# Patient Record
Sex: Female | Born: 1945
Health system: Southern US, Community
[De-identification: ages and names within clinical notes are randomized; demographics above are authoritative.]

## PROBLEM LIST (undated history)

## (undated) DIAGNOSIS — K219 Gastro-esophageal reflux disease without esophagitis: Secondary | ICD-10-CM

## (undated) DIAGNOSIS — F329 Major depressive disorder, single episode, unspecified: Secondary | ICD-10-CM

## (undated) DIAGNOSIS — F0390 Unspecified dementia without behavioral disturbance: Secondary | ICD-10-CM

## (undated) DIAGNOSIS — E785 Hyperlipidemia, unspecified: Secondary | ICD-10-CM

## (undated) DIAGNOSIS — I10 Essential (primary) hypertension: Secondary | ICD-10-CM

## (undated) DIAGNOSIS — F32A Depression, unspecified: Secondary | ICD-10-CM

## (undated) DIAGNOSIS — E039 Hypothyroidism, unspecified: Secondary | ICD-10-CM

## (undated) HISTORY — PX: BACK SURGERY: SHX140

## (undated) HISTORY — DX: Major depressive disorder, single episode, unspecified: F32.9

## (undated) HISTORY — DX: Hyperlipidemia, unspecified: E78.5

## (undated) HISTORY — DX: Gastro-esophageal reflux disease without esophagitis: K21.9

## (undated) HISTORY — DX: Unspecified dementia, unspecified severity, without behavioral disturbance, psychotic disturbance, mood disturbance, and anxiety: F03.90

## (undated) HISTORY — PX: APPENDECTOMY: SHX54

## (undated) HISTORY — PX: ABDOMINAL HYSTERECTOMY: SHX81

## (undated) HISTORY — PX: CHOLECYSTECTOMY: SHX55

## (undated) HISTORY — PX: SALPINGOOPHORECTOMY: SHX82

## (undated) HISTORY — DX: Hypothyroidism, unspecified: E03.9

## (undated) HISTORY — DX: Depression, unspecified: F32.A

## (undated) HISTORY — DX: Essential (primary) hypertension: I10

---

## 2003-12-28 ENCOUNTER — Ambulatory Visit: Payer: Self-pay | Admitting: Family Medicine

## 2004-03-11 ENCOUNTER — Encounter: Admission: RE | Admit: 2004-03-11 | Discharge: 2004-03-11 | Payer: Self-pay | Admitting: Neurosurgery

## 2004-03-23 ENCOUNTER — Encounter: Admission: RE | Admit: 2004-03-23 | Discharge: 2004-03-23 | Payer: Self-pay | Admitting: Neurosurgery

## 2004-03-28 ENCOUNTER — Inpatient Hospital Stay: Payer: Self-pay | Admitting: Internal Medicine

## 2004-04-20 ENCOUNTER — Encounter: Admission: RE | Admit: 2004-04-20 | Discharge: 2004-04-20 | Payer: Self-pay | Admitting: Neurosurgery

## 2004-09-07 ENCOUNTER — Ambulatory Visit (HOSPITAL_COMMUNITY): Admission: RE | Admit: 2004-09-07 | Discharge: 2004-09-07 | Payer: Self-pay | Admitting: Neurosurgery

## 2005-01-11 ENCOUNTER — Ambulatory Visit: Payer: Self-pay | Admitting: Unknown Physician Specialty

## 2007-07-20 ENCOUNTER — Encounter: Admission: RE | Admit: 2007-07-20 | Discharge: 2007-07-20 | Payer: Self-pay | Admitting: Neurosurgery

## 2009-09-11 ENCOUNTER — Ambulatory Visit: Payer: Self-pay | Admitting: Family Medicine

## 2009-12-16 LAB — HM MAMMOGRAPHY: HM Mammogram: NORMAL

## 2010-03-14 ENCOUNTER — Encounter: Payer: Self-pay | Admitting: Neurosurgery

## 2010-07-10 NOTE — Op Note (Signed)
NAME:  Paige Baldwin, Paige Baldwin                 ACCOUNT NO.:  [id number]   MEDICAL RECORD NO.:  [id number]          PATIENT TYPE:  OIB   LOCATION:  2899                         FACILITY:  [facility]   PHYSICIAN:  Kathaleen Maser. Pool, M.D.    DATE OF BIRTH:  04/16/1945   DATE OF PROCEDURE:  09/07/2004  DATE OF DISCHARGE:                                 OPERATIVE REPORT   PREOPERATIVE DIAGNOSES:  Right L4-5 stenosis with associated synovial cyst  and radiculopathy.   POSTOPERATIVE DIAGNOSES:  Right L4-5 stenosis with associated synovial cyst  and radiculopathy.   OPERATION PERFORMED:  Right L4-5 decompressive lumbar laminotomy with  resection of synovial cyst.   SURGEON:  Henry A. Pool, M.D.   ASSISTANT:  Izell Victoria. Elesa Hacker, M.D.   ANESTHESIA:  General endotracheal.   INDICATIONS FOR PROCEDURE:  The patient is a 65 year old female with a  history of back and right lower extremity pain consistent with a right-sided  L5 radiculopathy.  She failed conservative management.  Work-up demonstrates  evidence of a significant rightward L4-5 spondylitic protrusion and  associated synovial cysts causing compression to the right-sided L5 nerve  root.  The patient has been counseled as to her options.  She has decided to  proceed with a right-sided L4-5 laminotomy and resection of synovial cyst in  hopes of improving her symptoms.   DESCRIPTION OF PROCEDURE:  The patient was taken to the operating room and  placed on the table in the supine position.  After adequate level of  anesthesia was achieved, the patient was positioned prone onto a Wilson  frame and appropriately padded.  The patient's lumbar region was prepped and  draped sterilely.  A 10 blade was used to make a linear skin incision  overlying the L4-5 interspace.  This was carried down sharply in the  midline.  A subperiosteal dissection was then performed exposing the lamina  and facet joints of L4 and L5 on the right.  Deep self-retaining retractor  was placed.  Intraoperative x-ray was taken, the level was confirmed.  The  laminotomy was then performed using a high speed drill and Kerrison rongeurs  to remove the inferior aspect of the lamina at L4, medial aspect of the L4-5  facet joint and superior rim of the L5 lamina.  Ligamentum flavum was then  elevated and resected in piecemeal fashion using Kerrison rongeurs.  The  underlying thecal sac and exiting L5 nerve root were identified.  The  microscope was brought into the field and used for microdissection of the  right-sided S1 nerve root and associated synovial cyst.  The synovial cyst  was identified and resected using Kerrison rongeurs.  This was done along  the course of the right-sided L5 nerve root.  A wide foraminotomy then  performed along the course of the exiting L5 nerve root.  The disk itself  was quite flat.  There was no evidence of any residual compression.  There  was no evidence of injury to thecal sac or nerve roots.  The wound was then  irrigated with antibiotic solution.  Gelfoam was  placed topically for  hemostasis which was found to be good.  Microscope and retractor system were  removed.  Hemostasis in muscle was achieved with electrocautery.  The wound  was then closed in layers with Vicryl sutures.  Steri-Strips and sterile  dressing were applied.  There were no apparent complications.  The patient  tolerated the procedure well and returned to the recovery room  postoperatively.       HAP/MEDQ  D:  09/07/2004  T:  09/07/2004  Job:  [id number]

## 2011-01-02 ENCOUNTER — Observation Stay: Payer: Self-pay | Admitting: Student

## 2011-03-02 DIAGNOSIS — R5381 Other malaise: Secondary | ICD-10-CM | POA: Diagnosis not present

## 2011-03-02 DIAGNOSIS — E039 Hypothyroidism, unspecified: Secondary | ICD-10-CM | POA: Diagnosis not present

## 2011-03-02 DIAGNOSIS — F329 Major depressive disorder, single episode, unspecified: Secondary | ICD-10-CM | POA: Diagnosis not present

## 2011-03-02 DIAGNOSIS — R5383 Other fatigue: Secondary | ICD-10-CM | POA: Diagnosis not present

## 2011-03-02 DIAGNOSIS — R109 Unspecified abdominal pain: Secondary | ICD-10-CM | POA: Diagnosis not present

## 2011-04-06 DIAGNOSIS — E7800 Pure hypercholesterolemia, unspecified: Secondary | ICD-10-CM | POA: Diagnosis not present

## 2011-04-06 DIAGNOSIS — I10 Essential (primary) hypertension: Secondary | ICD-10-CM | POA: Diagnosis not present

## 2011-04-06 DIAGNOSIS — F329 Major depressive disorder, single episode, unspecified: Secondary | ICD-10-CM | POA: Diagnosis not present

## 2011-04-06 DIAGNOSIS — R5381 Other malaise: Secondary | ICD-10-CM | POA: Diagnosis not present

## 2011-04-09 DIAGNOSIS — R5381 Other malaise: Secondary | ICD-10-CM | POA: Diagnosis not present

## 2011-04-09 DIAGNOSIS — R5383 Other fatigue: Secondary | ICD-10-CM | POA: Diagnosis not present

## 2011-04-09 DIAGNOSIS — E039 Hypothyroidism, unspecified: Secondary | ICD-10-CM | POA: Diagnosis not present

## 2011-04-21 DIAGNOSIS — R5383 Other fatigue: Secondary | ICD-10-CM | POA: Diagnosis not present

## 2011-04-21 DIAGNOSIS — F329 Major depressive disorder, single episode, unspecified: Secondary | ICD-10-CM | POA: Diagnosis not present

## 2011-04-21 DIAGNOSIS — I10 Essential (primary) hypertension: Secondary | ICD-10-CM | POA: Diagnosis not present

## 2011-04-21 DIAGNOSIS — R5381 Other malaise: Secondary | ICD-10-CM | POA: Diagnosis not present

## 2011-05-03 DIAGNOSIS — L299 Pruritus, unspecified: Secondary | ICD-10-CM | POA: Diagnosis not present

## 2011-07-29 DIAGNOSIS — F329 Major depressive disorder, single episode, unspecified: Secondary | ICD-10-CM | POA: Diagnosis not present

## 2011-07-29 DIAGNOSIS — I10 Essential (primary) hypertension: Secondary | ICD-10-CM | POA: Diagnosis not present

## 2011-07-29 DIAGNOSIS — E785 Hyperlipidemia, unspecified: Secondary | ICD-10-CM | POA: Diagnosis not present

## 2011-07-29 DIAGNOSIS — E039 Hypothyroidism, unspecified: Secondary | ICD-10-CM | POA: Diagnosis not present

## 2011-07-30 DIAGNOSIS — E039 Hypothyroidism, unspecified: Secondary | ICD-10-CM | POA: Diagnosis not present

## 2011-07-30 DIAGNOSIS — E785 Hyperlipidemia, unspecified: Secondary | ICD-10-CM | POA: Diagnosis not present

## 2011-11-12 DIAGNOSIS — L819 Disorder of pigmentation, unspecified: Secondary | ICD-10-CM | POA: Diagnosis not present

## 2011-11-12 DIAGNOSIS — L219 Seborrheic dermatitis, unspecified: Secondary | ICD-10-CM | POA: Diagnosis not present

## 2012-01-15 DIAGNOSIS — E039 Hypothyroidism, unspecified: Secondary | ICD-10-CM | POA: Diagnosis not present

## 2012-01-15 DIAGNOSIS — E785 Hyperlipidemia, unspecified: Secondary | ICD-10-CM | POA: Diagnosis not present

## 2012-01-15 DIAGNOSIS — Z23 Encounter for immunization: Secondary | ICD-10-CM | POA: Diagnosis not present

## 2012-03-13 DIAGNOSIS — Z1151 Encounter for screening for human papillomavirus (HPV): Secondary | ICD-10-CM | POA: Diagnosis not present

## 2012-03-13 DIAGNOSIS — Z124 Encounter for screening for malignant neoplasm of cervix: Secondary | ICD-10-CM | POA: Diagnosis not present

## 2012-03-13 DIAGNOSIS — Z1231 Encounter for screening mammogram for malignant neoplasm of breast: Secondary | ICD-10-CM | POA: Diagnosis not present

## 2012-03-13 DIAGNOSIS — Z01419 Encounter for gynecological examination (general) (routine) without abnormal findings: Secondary | ICD-10-CM | POA: Diagnosis not present

## 2012-03-28 DIAGNOSIS — E039 Hypothyroidism, unspecified: Secondary | ICD-10-CM | POA: Diagnosis not present

## 2012-03-28 DIAGNOSIS — F411 Generalized anxiety disorder: Secondary | ICD-10-CM | POA: Diagnosis not present

## 2012-03-28 DIAGNOSIS — F329 Major depressive disorder, single episode, unspecified: Secondary | ICD-10-CM | POA: Diagnosis not present

## 2012-03-28 DIAGNOSIS — I10 Essential (primary) hypertension: Secondary | ICD-10-CM | POA: Diagnosis not present

## 2012-04-26 DIAGNOSIS — F329 Major depressive disorder, single episode, unspecified: Secondary | ICD-10-CM | POA: Diagnosis not present

## 2012-04-26 DIAGNOSIS — K5289 Other specified noninfective gastroenteritis and colitis: Secondary | ICD-10-CM | POA: Diagnosis not present

## 2012-04-26 DIAGNOSIS — F411 Generalized anxiety disorder: Secondary | ICD-10-CM | POA: Diagnosis not present

## 2012-04-26 DIAGNOSIS — I10 Essential (primary) hypertension: Secondary | ICD-10-CM | POA: Diagnosis not present

## 2012-04-27 DIAGNOSIS — I10 Essential (primary) hypertension: Secondary | ICD-10-CM | POA: Diagnosis not present

## 2012-04-27 DIAGNOSIS — R197 Diarrhea, unspecified: Secondary | ICD-10-CM | POA: Diagnosis not present

## 2012-04-27 DIAGNOSIS — E039 Hypothyroidism, unspecified: Secondary | ICD-10-CM | POA: Diagnosis not present

## 2012-04-27 DIAGNOSIS — E785 Hyperlipidemia, unspecified: Secondary | ICD-10-CM | POA: Diagnosis not present

## 2012-05-22 DIAGNOSIS — R197 Diarrhea, unspecified: Secondary | ICD-10-CM | POA: Diagnosis not present

## 2012-05-22 DIAGNOSIS — F411 Generalized anxiety disorder: Secondary | ICD-10-CM | POA: Diagnosis not present

## 2012-05-22 DIAGNOSIS — E039 Hypothyroidism, unspecified: Secondary | ICD-10-CM | POA: Diagnosis not present

## 2012-05-22 DIAGNOSIS — R109 Unspecified abdominal pain: Secondary | ICD-10-CM | POA: Diagnosis not present

## 2012-05-31 DIAGNOSIS — R109 Unspecified abdominal pain: Secondary | ICD-10-CM | POA: Diagnosis not present

## 2012-06-06 ENCOUNTER — Ambulatory Visit: Payer: Self-pay | Admitting: Gastroenterology

## 2012-06-06 DIAGNOSIS — R197 Diarrhea, unspecified: Secondary | ICD-10-CM | POA: Diagnosis not present

## 2012-06-06 DIAGNOSIS — K648 Other hemorrhoids: Secondary | ICD-10-CM | POA: Diagnosis not present

## 2012-06-06 DIAGNOSIS — Z79899 Other long term (current) drug therapy: Secondary | ICD-10-CM | POA: Diagnosis not present

## 2012-06-06 DIAGNOSIS — R1084 Generalized abdominal pain: Secondary | ICD-10-CM | POA: Diagnosis not present

## 2012-06-06 DIAGNOSIS — E785 Hyperlipidemia, unspecified: Secondary | ICD-10-CM | POA: Diagnosis not present

## 2012-06-06 DIAGNOSIS — Z8249 Family history of ischemic heart disease and other diseases of the circulatory system: Secondary | ICD-10-CM | POA: Diagnosis not present

## 2012-06-06 DIAGNOSIS — K649 Unspecified hemorrhoids: Secondary | ICD-10-CM | POA: Diagnosis not present

## 2012-06-06 DIAGNOSIS — K219 Gastro-esophageal reflux disease without esophagitis: Secondary | ICD-10-CM | POA: Diagnosis not present

## 2012-06-06 DIAGNOSIS — K449 Diaphragmatic hernia without obstruction or gangrene: Secondary | ICD-10-CM | POA: Diagnosis not present

## 2012-06-06 DIAGNOSIS — I10 Essential (primary) hypertension: Secondary | ICD-10-CM | POA: Diagnosis not present

## 2012-06-06 DIAGNOSIS — M1990 Unspecified osteoarthritis, unspecified site: Secondary | ICD-10-CM | POA: Diagnosis not present

## 2012-06-06 DIAGNOSIS — Z9079 Acquired absence of other genital organ(s): Secondary | ICD-10-CM | POA: Diagnosis not present

## 2012-06-06 DIAGNOSIS — R109 Unspecified abdominal pain: Secondary | ICD-10-CM | POA: Diagnosis not present

## 2012-06-06 DIAGNOSIS — Z8489 Family history of other specified conditions: Secondary | ICD-10-CM | POA: Diagnosis not present

## 2012-06-06 DIAGNOSIS — E039 Hypothyroidism, unspecified: Secondary | ICD-10-CM | POA: Diagnosis not present

## 2012-06-06 DIAGNOSIS — K5289 Other specified noninfective gastroenteritis and colitis: Secondary | ICD-10-CM | POA: Diagnosis not present

## 2012-06-06 LAB — HM COLONOSCOPY: HM Colonoscopy: NORMAL

## 2012-06-12 DIAGNOSIS — K5289 Other specified noninfective gastroenteritis and colitis: Secondary | ICD-10-CM | POA: Diagnosis not present

## 2012-12-07 DIAGNOSIS — E039 Hypothyroidism, unspecified: Secondary | ICD-10-CM | POA: Diagnosis not present

## 2012-12-07 DIAGNOSIS — F411 Generalized anxiety disorder: Secondary | ICD-10-CM | POA: Diagnosis not present

## 2012-12-07 DIAGNOSIS — Z23 Encounter for immunization: Secondary | ICD-10-CM | POA: Diagnosis not present

## 2012-12-07 DIAGNOSIS — R197 Diarrhea, unspecified: Secondary | ICD-10-CM | POA: Diagnosis not present

## 2012-12-07 DIAGNOSIS — R109 Unspecified abdominal pain: Secondary | ICD-10-CM | POA: Diagnosis not present

## 2013-01-26 DIAGNOSIS — L738 Other specified follicular disorders: Secondary | ICD-10-CM | POA: Diagnosis not present

## 2013-01-26 DIAGNOSIS — L819 Disorder of pigmentation, unspecified: Secondary | ICD-10-CM | POA: Diagnosis not present

## 2013-01-26 DIAGNOSIS — D239 Other benign neoplasm of skin, unspecified: Secondary | ICD-10-CM | POA: Diagnosis not present

## 2013-03-16 DIAGNOSIS — M949 Disorder of cartilage, unspecified: Secondary | ICD-10-CM | POA: Diagnosis not present

## 2013-03-16 DIAGNOSIS — M899 Disorder of bone, unspecified: Secondary | ICD-10-CM | POA: Diagnosis not present

## 2013-03-16 DIAGNOSIS — Z1151 Encounter for screening for human papillomavirus (HPV): Secondary | ICD-10-CM | POA: Diagnosis not present

## 2013-03-16 DIAGNOSIS — Z01419 Encounter for gynecological examination (general) (routine) without abnormal findings: Secondary | ICD-10-CM | POA: Diagnosis not present

## 2013-06-06 DIAGNOSIS — F411 Generalized anxiety disorder: Secondary | ICD-10-CM | POA: Diagnosis not present

## 2013-06-06 DIAGNOSIS — E7800 Pure hypercholesterolemia, unspecified: Secondary | ICD-10-CM | POA: Diagnosis not present

## 2013-06-06 DIAGNOSIS — I10 Essential (primary) hypertension: Secondary | ICD-10-CM | POA: Diagnosis not present

## 2013-06-06 DIAGNOSIS — F320 Major depressive disorder, single episode, mild: Secondary | ICD-10-CM | POA: Diagnosis not present

## 2013-06-06 DIAGNOSIS — Z23 Encounter for immunization: Secondary | ICD-10-CM | POA: Diagnosis not present

## 2013-06-19 DIAGNOSIS — E039 Hypothyroidism, unspecified: Secondary | ICD-10-CM | POA: Diagnosis not present

## 2013-06-19 DIAGNOSIS — I10 Essential (primary) hypertension: Secondary | ICD-10-CM | POA: Diagnosis not present

## 2013-06-19 DIAGNOSIS — M19049 Primary osteoarthritis, unspecified hand: Secondary | ICD-10-CM | POA: Diagnosis not present

## 2013-06-19 DIAGNOSIS — E785 Hyperlipidemia, unspecified: Secondary | ICD-10-CM | POA: Diagnosis not present

## 2013-12-18 DIAGNOSIS — Z23 Encounter for immunization: Secondary | ICD-10-CM | POA: Diagnosis not present

## 2014-01-31 DIAGNOSIS — L821 Other seborrheic keratosis: Secondary | ICD-10-CM | POA: Diagnosis not present

## 2014-01-31 DIAGNOSIS — L814 Other melanin hyperpigmentation: Secondary | ICD-10-CM | POA: Diagnosis not present

## 2014-01-31 DIAGNOSIS — D1800 Hemangioma unspecified site: Secondary | ICD-10-CM | POA: Diagnosis not present

## 2014-01-31 DIAGNOSIS — L820 Inflamed seborrheic keratosis: Secondary | ICD-10-CM | POA: Diagnosis not present

## 2014-01-31 DIAGNOSIS — Z1283 Encounter for screening for malignant neoplasm of skin: Secondary | ICD-10-CM | POA: Diagnosis not present

## 2014-01-31 DIAGNOSIS — L578 Other skin changes due to chronic exposure to nonionizing radiation: Secondary | ICD-10-CM | POA: Diagnosis not present

## 2014-01-31 DIAGNOSIS — D229 Melanocytic nevi, unspecified: Secondary | ICD-10-CM | POA: Diagnosis not present

## 2014-03-07 DIAGNOSIS — Z0000 Encounter for general adult medical examination without abnormal findings: Secondary | ICD-10-CM | POA: Diagnosis not present

## 2014-03-19 DIAGNOSIS — Z1231 Encounter for screening mammogram for malignant neoplasm of breast: Secondary | ICD-10-CM | POA: Diagnosis not present

## 2014-03-19 DIAGNOSIS — Z01419 Encounter for gynecological examination (general) (routine) without abnormal findings: Secondary | ICD-10-CM | POA: Diagnosis not present

## 2014-03-19 DIAGNOSIS — M8580 Other specified disorders of bone density and structure, unspecified site: Secondary | ICD-10-CM | POA: Diagnosis not present

## 2014-03-21 DIAGNOSIS — F419 Anxiety disorder, unspecified: Secondary | ICD-10-CM | POA: Diagnosis not present

## 2014-03-21 DIAGNOSIS — F329 Major depressive disorder, single episode, unspecified: Secondary | ICD-10-CM | POA: Diagnosis not present

## 2014-03-21 DIAGNOSIS — I10 Essential (primary) hypertension: Secondary | ICD-10-CM | POA: Diagnosis not present

## 2014-03-21 DIAGNOSIS — E039 Hypothyroidism, unspecified: Secondary | ICD-10-CM | POA: Diagnosis not present

## 2014-03-21 DIAGNOSIS — E785 Hyperlipidemia, unspecified: Secondary | ICD-10-CM | POA: Diagnosis not present

## 2014-03-22 DIAGNOSIS — E063 Autoimmune thyroiditis: Secondary | ICD-10-CM | POA: Diagnosis not present

## 2014-03-22 DIAGNOSIS — F320 Major depressive disorder, single episode, mild: Secondary | ICD-10-CM | POA: Diagnosis not present

## 2014-03-22 DIAGNOSIS — E785 Hyperlipidemia, unspecified: Secondary | ICD-10-CM | POA: Diagnosis not present

## 2014-03-22 LAB — HEPATIC FUNCTION PANEL
ALT: 10 U/L (ref 7–35)
AST: 15 U/L (ref 13–35)
Alkaline Phosphatase: 98 U/L (ref 25–125)
Bilirubin, Total: 0.5 mg/dL

## 2014-03-22 LAB — BASIC METABOLIC PANEL
BUN: 15 mg/dL (ref 4–21)
CREATININE: 0.8 mg/dL (ref <=1.1)
Glucose: 92 mg/dL
POTASSIUM: 4.3 mmol/L (ref 3.4–5.3)
SODIUM: 143 mmol/L (ref 137–147)

## 2014-03-22 LAB — CBC AND DIFFERENTIAL
HEMATOCRIT: 38 % (ref 36–46)
HEMOGLOBIN: 12.7 g/dL (ref 12.0–16.0)
NEUTROS ABS: 49 /uL
Platelets: 250 10*3/uL (ref 150–399)
WBC: 6.3 10*3/mL

## 2014-03-22 LAB — LIPID PANEL
CHOLESTEROL: 286 mg/dL — AB (ref 0–200)
HDL: 75 mg/dL — AB (ref 35–70)
LDL Cholesterol: 187 mg/dL
LDl/HDL Ratio: 2.5
Triglycerides: 118 mg/dL (ref 40–160)

## 2014-03-22 LAB — TSH: TSH: 5.06 u[IU]/mL (ref <=5.90)

## 2014-03-29 DIAGNOSIS — R928 Other abnormal and inconclusive findings on diagnostic imaging of breast: Secondary | ICD-10-CM | POA: Diagnosis not present

## 2014-06-06 DIAGNOSIS — M19049 Primary osteoarthritis, unspecified hand: Secondary | ICD-10-CM | POA: Diagnosis not present

## 2014-06-18 ENCOUNTER — Ambulatory Visit
Admit: 2014-06-18 | Disposition: A | Discharge status: Home / Self Care | Payer: Self-pay | Attending: Specialist | Admitting: Specialist

## 2014-06-18 ENCOUNTER — Ambulatory Visit: Payer: Self-pay

## 2014-06-18 DIAGNOSIS — Z01812 Encounter for preprocedural laboratory examination: Secondary | ICD-10-CM | POA: Diagnosis not present

## 2014-06-18 DIAGNOSIS — Z01810 Encounter for preprocedural cardiovascular examination: Secondary | ICD-10-CM | POA: Diagnosis not present

## 2014-06-18 LAB — CBC WITH DIFFERENTIAL/PLATELET
BASOS ABS: 0 10*3/uL (ref 0.0–0.1)
Basophil %: 0.6 %
Eosinophil #: 0.1 10*3/uL (ref 0.0–0.7)
Eosinophil %: 1.1 %
HCT: 38.7 % (ref 35.0–47.0)
HGB: 12.6 g/dL (ref 12.0–16.0)
LYMPHS ABS: 1.8 10*3/uL (ref 1.0–3.6)
LYMPHS PCT: 32.5 %
MCH: 27.3 pg (ref 26.0–34.0)
MCHC: 32.7 g/dL (ref 32.0–36.0)
MCV: 84 fL (ref 80–100)
MONO ABS: 0.9 x10 3/mm (ref 0.2–0.9)
MONOS PCT: 15.8 %
Neutrophil #: 2.8 10*3/uL (ref 1.4–6.5)
Neutrophil %: 50 %
Platelet: 213 10*3/uL (ref 150–440)
RBC: 4.63 10*6/uL (ref 3.80–5.20)
RDW: 14 % (ref 11.5–14.5)
WBC: 5.5 10*3/uL (ref 3.6–11.0)

## 2014-06-25 ENCOUNTER — Ambulatory Visit
Admission: RE | Admit: 2014-06-25 | Discharge: 2014-06-25 | Disposition: A | Discharge status: Home / Self Care | Payer: Medicare Other | Source: Ambulatory Visit | Attending: Specialist | Admitting: Specialist

## 2014-06-25 ENCOUNTER — Inpatient Hospital Stay: Payer: Medicare Other | Admitting: Certified Registered Nurse Anesthetist

## 2014-06-25 ENCOUNTER — Encounter: Payer: Self-pay | Admitting: *Deleted

## 2014-06-25 ENCOUNTER — Encounter
Admission: RE | Disposition: A | Discharge status: Home / Self Care | Payer: Self-pay | Source: Ambulatory Visit | Attending: Specialist

## 2014-06-25 DIAGNOSIS — M1811 Unilateral primary osteoarthritis of first carpometacarpal joint, right hand: Secondary | ICD-10-CM | POA: Insufficient documentation

## 2014-06-25 DIAGNOSIS — Z79899 Other long term (current) drug therapy: Secondary | ICD-10-CM | POA: Diagnosis not present

## 2014-06-25 DIAGNOSIS — I10 Essential (primary) hypertension: Secondary | ICD-10-CM | POA: Insufficient documentation

## 2014-06-25 DIAGNOSIS — E049 Nontoxic goiter, unspecified: Secondary | ICD-10-CM | POA: Diagnosis not present

## 2014-06-25 DIAGNOSIS — K219 Gastro-esophageal reflux disease without esophagitis: Secondary | ICD-10-CM | POA: Insufficient documentation

## 2014-06-25 DIAGNOSIS — F329 Major depressive disorder, single episode, unspecified: Secondary | ICD-10-CM | POA: Diagnosis not present

## 2014-06-25 DIAGNOSIS — M189 Osteoarthritis of first carpometacarpal joint, unspecified: Secondary | ICD-10-CM | POA: Diagnosis present

## 2014-06-25 DIAGNOSIS — E039 Hypothyroidism, unspecified: Secondary | ICD-10-CM | POA: Diagnosis not present

## 2014-06-25 DIAGNOSIS — E785 Hyperlipidemia, unspecified: Secondary | ICD-10-CM | POA: Insufficient documentation

## 2014-06-25 DIAGNOSIS — M19049 Primary osteoarthritis, unspecified hand: Secondary | ICD-10-CM | POA: Diagnosis not present

## 2014-06-25 HISTORY — PX: FINGER ARTHROPLASTY: SHX5017

## 2014-06-25 SURGERY — ARTHROPLASTY, FINGER
Anesthesia: General | Laterality: Right

## 2014-06-25 MED ORDER — FENTANYL CITRATE (PF) 100 MCG/2ML IJ SOLN: 25.0000 ug | INTRAMUSCULAR | Status: DC | PRN

## 2014-06-25 MED ORDER — PROPOFOL 10 MG/ML IV BOLUS
INTRAVENOUS | Status: DC | PRN
  Administered 2014-06-25: 160 mg via INTRAVENOUS

## 2014-06-25 MED ORDER — LACTATED RINGERS IV SOLN
INTRAVENOUS | Status: DC
  Administered 2014-06-25: 07:00:00 via INTRAVENOUS

## 2014-06-25 MED ORDER — DEXAMETHASONE SODIUM PHOSPHATE 4 MG/ML IJ SOLN
INTRAMUSCULAR | Status: DC | PRN
Start: 2014-06-25 — End: 2014-06-25
  Administered 2014-06-25: 5 mg via INTRAVENOUS

## 2014-06-25 MED ORDER — FAMOTIDINE 20 MG PO TABS
ORAL_TABLET | ORAL | Status: AC
  Filled 2014-06-25: qty 1

## 2014-06-25 MED ORDER — ONDANSETRON HCL 4 MG/2ML IJ SOLN: 4.0000 mg | Freq: Once | INTRAMUSCULAR | Status: DC | PRN

## 2014-06-25 MED ORDER — NEOMYCIN-POLYMYXIN B GU 40-200000 IR SOLN
Status: AC
  Filled 2014-06-25: qty 2

## 2014-06-25 MED ORDER — BUPIVACAINE HCL (PF) 0.5 % IJ SOLN
INTRAMUSCULAR | Status: DC | PRN
  Administered 2014-06-25: 16 mL

## 2014-06-25 MED ORDER — LIDOCAINE HCL (CARDIAC) 20 MG/ML IV SOLN
INTRAVENOUS | Status: DC | PRN
Start: 2014-06-25 — End: 2014-06-25
  Administered 2014-06-25: 30 mg via INTRAVENOUS

## 2014-06-25 MED ORDER — MIDAZOLAM HCL 2 MG/2ML IJ SOLN
INTRAMUSCULAR | Status: DC | PRN
  Administered 2014-06-25: 2 mg via INTRAVENOUS

## 2014-06-25 MED ORDER — GELATIN ABSORBABLE 12-7 MM EX MISC
CUTANEOUS | Status: AC
  Filled 2014-06-25: qty 2

## 2014-06-25 MED ORDER — EPHEDRINE SULFATE 50 MG/ML IJ SOLN
INTRAMUSCULAR | Status: DC | PRN
  Administered 2014-06-25: 5 mg via INTRAVENOUS

## 2014-06-25 MED ORDER — NORCO 5-325 MG PO TABS: 1.0000 | ORAL_TABLET | ORAL | Status: DC | PRN

## 2014-06-25 MED ORDER — GLYCOPYRROLATE 0.2 MG/ML IJ SOLN
INTRAMUSCULAR | Status: DC | PRN
  Administered 2014-06-25: .1 mg via INTRAVENOUS

## 2014-06-25 MED ORDER — ONDANSETRON HCL 4 MG/2ML IJ SOLN
INTRAMUSCULAR | Status: DC | PRN
  Administered 2014-06-25: 4 mg via INTRAVENOUS

## 2014-06-25 MED ORDER — FENTANYL CITRATE (PF) 100 MCG/2ML IJ SOLN
INTRAMUSCULAR | Status: DC | PRN
  Administered 2014-06-25: 100 ug via INTRAVENOUS

## 2014-06-25 MED ORDER — NEOMYCIN-POLYMYXIN B GU 40-200000 IR SOLN
Status: DC | PRN
  Administered 2014-06-25: 2 mL

## 2014-06-25 MED ORDER — FAMOTIDINE 20 MG PO TABS
20.0000 mg | ORAL_TABLET | Freq: Once | ORAL | Status: AC
  Administered 2014-06-25: 20 mg via ORAL

## 2014-06-25 MED ORDER — CEFAZOLIN SODIUM-DEXTROSE 2-3 GM-% IV SOLR
INTRAVENOUS | Status: AC
  Filled 2014-06-25: qty 50

## 2014-06-25 MED ORDER — BUPIVACAINE HCL (PF) 0.5 % IJ SOLN
INTRAMUSCULAR | Status: AC
  Filled 2014-06-25: qty 30

## 2014-06-25 MED ORDER — GELATIN ABSORBABLE 12-7 MM EX MISC
CUTANEOUS | Status: AC
  Filled 2014-06-25: qty 1

## 2014-06-25 MED ORDER — CEFAZOLIN SODIUM-DEXTROSE 2-3 GM-% IV SOLR
2.0000 g | Freq: Once | INTRAVENOUS | Status: AC
  Administered 2014-06-25: 2 g via INTRAVENOUS

## 2014-06-25 SURGICAL SUPPLY — 32 items
BANDAGE ELASTIC 3 CLIP NS LF (GAUZE/BANDAGES/DRESSINGS) ×3 IMPLANT
BLADE OSC/SAGITTAL MD 5.5X18 (BLADE) ×3 IMPLANT
BNDG ESMARK 4X12 TAN STRL LF (GAUZE/BANDAGES/DRESSINGS) ×3 IMPLANT
BUR DIAMOND COARSE 3.0 (BURR) ×3 IMPLANT
CANISTER SUCT 1200ML W/VALVE (MISCELLANEOUS) ×3 IMPLANT
CHLORAPREP W/TINT 26ML (MISCELLANEOUS) ×3 IMPLANT
CLOSURE WOUND 1/4X4 (GAUZE/BANDAGES/DRESSINGS) ×1
GAUZE PETRO XEROFOAM 1X8 (MISCELLANEOUS) ×3 IMPLANT
GAUZE SPONGE 4X4 12PLY STRL (GAUZE/BANDAGES/DRESSINGS) ×3 IMPLANT
GAUZE XEROFORM 4X4 STRL (GAUZE/BANDAGES/DRESSINGS) ×3 IMPLANT
GLOVE BIO SURGEON STRL SZ7.5 (GLOVE) ×9 IMPLANT
GOWN STRL REUS W/ TWL LRG LVL3 (GOWN DISPOSABLE) ×1 IMPLANT
GOWN STRL REUS W/TWL LRG LVL3 (GOWN DISPOSABLE) ×2
NEEDLE FILTER BLUNT 18X 1/2SAF (NEEDLE) ×2
NEEDLE FILTER BLUNT 18X1 1/2 (NEEDLE) ×1 IMPLANT
NS IRRIG 500ML POUR BTL (IV SOLUTION) ×3 IMPLANT
PACK EXTREMITY ARMC (MISCELLANEOUS) ×3 IMPLANT
PAD CAST CTTN 4X4 STRL (SOFTGOODS) ×1 IMPLANT
PAD GROUND ADULT SPLIT (MISCELLANEOUS) ×3 IMPLANT
PADDING CAST COTTON 4X4 STRL (SOFTGOODS) ×2
SPLINT CAST 1 STEP 3X12 (MISCELLANEOUS) ×3 IMPLANT
STOCKINETTE STRL 4IN 9604848 (GAUZE/BANDAGES/DRESSINGS) ×3 IMPLANT
STRAP SAFETY BODY (MISCELLANEOUS) ×3 IMPLANT
STRIP CLOSURE SKIN 1/4X4 (GAUZE/BANDAGES/DRESSINGS) ×2 IMPLANT
SUT ETHIBOND 3-0  EXTR (SUTURE)
SUT ETHIBOND 3-0 EXTR (SUTURE) IMPLANT
SUT MERSILENE 4-0 WHT RB-1 (SUTURE) ×3 IMPLANT
SUT PROLENE 3 0 FS 2 (SUTURE) ×3 IMPLANT
SUT VIC AB 2-0 SH 27 (SUTURE)
SUT VIC AB 2-0 SH 27XBRD (SUTURE) IMPLANT
SUT VICRYL 5-0 (SUTURE) ×3 IMPLANT
SYRINGE 10CC LL (SYRINGE) ×3 IMPLANT

## 2014-06-25 NOTE — Anesthesia Postprocedure Evaluation (Signed)
  Anesthesia Post-op Note  Patient: Paige Baldwin  Procedure(s) Performed: Procedure(s): FINGER ARTHROPLASTY (Right)  Anesthesia type:General  Patient location: PACU  Post pain: Pain level controlled  Post assessment: Post-op Vital signs reviewed, Patient's Cardiovascular Status Stable, Respiratory Function Stable, Patent Airway and No signs of Nausea or vomiting  Post vital signs: Reviewed and stable  Last Vitals:  Filed Vitals:   06/25/14 0915  BP:   Pulse:   Temp: 35.9 C  Resp:     Level of consciousness: awake, alert  and patient cooperative  Complications: No apparent anesthesia complications

## 2014-06-25 NOTE — Discharge Instructions (Signed)

## 2014-06-25 NOTE — Transfer of Care (Signed)
Immediate Anesthesia Transfer of Care Note  Patient: Paige Baldwin  Procedure(s) Performed: Procedure(s): FINGER ARTHROPLASTY (Right)  Patient Location: PACU  Anesthesia Type:General  Level of Consciousness: awake, alert  and oriented  Airway & Oxygen Therapy: Patient Spontanous Breathing and Patient connected to nasal cannula oxygen  Post-op Assessment: Report given to RN  Post vital signs: stable  Last Vitals:  Filed Vitals:   06/25/14 0700  BP: 171/75  Pulse: 83  Temp: 36.6 C  Resp: 16    Complications: No apparent anesthesia complications

## 2014-06-25 NOTE — Brief Op Note (Signed)
06/25/2014  9:24 AM  PATIENT:  Paige Baldwin  69 y.o. female  PRE-OPERATIVE DIAGNOSIS:  OA OF right hand  POST-OPERATIVE DIAGNOSIS:  * No post-op diagnosis entered *  PROCEDURE:  Procedure(s): Excisional trapezium arthroplasty right thumb  SURGEON:  Surgeon(s) and Role:    * Christophe Louis, MD - Primary  PHYSICIAN ASSISTANT:   ASSISTANTS: none   ANESTHESIA:   general  EBL:  Total I/O In: 900 [I.V.:900] Out: 3 [Blood:3]  BLOOD ADMINISTERED:none  DRAINS: none   LOCAL MEDICATIONS USED:  NONE  SPECIMEN:  No Specimen  DISPOSITION OF SPECIMEN:  N/A  COUNTS:  YES  TOURNIQUET:   Total Tourniquet Time Documented: Upper Arm (Right) - 47 minutes Total: Upper Arm (Right) - 47 minutes   DICTATION: .Other Dictation: Dictation Number    PLAN OF CARE: Discharge to home after PACU  PATIENT DISPOSITION:  PACU - hemodynamically stable.   Delay start of Pharmacological VTE agent (>24hrs) due to surgical blood loss or risk of bleeding: not applicable

## 2014-06-25 NOTE — Anesthesia Procedure Notes (Signed)
Procedures

## 2014-06-25 NOTE — H&P (Signed)
  Date of Initial H&P: 06/11/14  History reviewed, patient examined, no change in status, stable for surgery.

## 2014-06-25 NOTE — Anesthesia Preprocedure Evaluation (Signed)
Anesthesia Evaluation  Patient identified by MRN, date of birth, ID band Patient awake    Reviewed: reviewed documented beta blocker date and time   Airway Mallampati: II       Dental no notable dental hx.    Pulmonary neg pulmonary ROS,          Cardiovascular hypertension, Pt. on home beta blockers - Valvular Problems/MurmursRhythm:Regular Rate:Normal     Neuro/Psych Depression negative neurological ROS     GI/Hepatic Neg liver ROS, GERD-  Medicated and Controlled,  Endo/Other  Hypothyroidism   Renal/GU negative Renal ROS  negative genitourinary   Musculoskeletal  (+) Arthritis -, Osteoarthritis,    Abdominal Normal abdominal exam  (+)   Peds negative pediatric ROS (+)  Hematology negative hematology ROS (+)   Anesthesia Other Findings   Reproductive/Obstetrics negative OB ROS                             Anesthesia Physical Anesthesia Plan  ASA: II  Anesthesia Plan: General   Post-op Pain Management:    Induction: Intravenous  Airway Management Planned: LMA  Additional Equipment:   Intra-op Plan:   Post-operative Plan: Extubation in OR  Informed Consent: I have reviewed the patients History and Physical, chart, labs and discussed the procedure including the risks, benefits and alternatives for the proposed anesthesia with the patient or authorized representative who has indicated his/her understanding and acceptance.     Plan Discussed with: CRNA and Surgeon  Anesthesia Plan Comments:         Anesthesia Quick Evaluation

## 2014-06-26 NOTE — Op Note (Signed)
NAMEJULYA, Paige Baldwin NO.:  [id number]  MEDICAL RECORD NO.:  [id number]  LOCATION:  [facility]                         FACILITY:  [facility]  PHYSICIAN:  Margaretmary Eddy, MD        DATE OF BIRTH:  06/19/1945  DATE OF PROCEDURE:  06/25/2014 DATE OF DISCHARGE:  06/25/2014                              OPERATIVE REPORT   PREOPERATIVE DIAGNOSIS:  Severe degenerative arthritis carpometacarpal joint right thumb.  POSTOPERATIVE DIAGNOSIS:  Severe degenerative arthritis carpometacarpal joint right thumb.  PROCEDURE PERFORMED:  Excisional trapezium arthroplasty right thumb.  SURGEON:  Margaretmary Eddy, MD  ANESTHESIA:  General.  COMPLICATIONS:  None.  TOURNIQUET TIME:  42 minutes.  DESCRIPTION OF PROCEDURE:  2 g of Ancef were given intravenously prior to the procedure.  General anesthesia was induced.  The right upper extremity was thoroughly prepped with alcohol and ChloraPrep and draped in standard sterile fashion.  Prior to elevating the tourniquet, median nerve block and complete wrist block were performed at the base of the hand using plain 0.5% Marcaine.  The extremity was wrapped out with the Esmarch bandage.  The pneumatic tourniquet was elevated to 250 mmHg. Under loupe magnification, standard dorsal incision was made for approximately 1-1/4 inches over the carpometacarpal joint of the thumb. The dissection was carefully carried down under loupe magnification and cutaneous nerves and vessels were preserved.  The capsule was completely dissected out, and the retractor was placed with preservation of the tendons.  Dorsal capsule was then incised longitudinally with preservation of the capsule for later repair.  The trapezium was completely dissected out.  The central portion of the trapezium was excised using the TPS bur, and the remainder of the trapezium was removed using the rongeur.  Careful palpation demonstrated no residual bone in the wound.  The wound was thoroughly  irrigated multiple times. Three small Gelfoam sponges were then placed and compressed into the shape of a trapezium and sewn together using 4-0 Mersilene.  This was then sewn into the base of the wound through the flexor tendon and was seen to be secure.  The wound was thoroughly irrigated multiple times again.  Capsule was meticulously repaired with 4-0 Mersilene.  One subcutaneous 5-0 Vicryl suture was placed.  A running subcuticular 3-0 Prolene suture was placed.  Soft, bulky dressing with a thumb spica splint was applied.  Tourniquet was released and capillary refill returned to the thumb.  The patient was returned to the recovery room in satisfactory condition, having tolerated the procedure quite well.          ______________________________ Margaretmary Eddy, MD     CS/MEDQ  D:  06/26/2014  T:  06/26/2014  Job:  [id number]

## 2014-07-01 ENCOUNTER — Encounter: Payer: Self-pay | Admitting: Specialist

## 2014-07-12 DIAGNOSIS — F320 Major depressive disorder, single episode, mild: Secondary | ICD-10-CM | POA: Insufficient documentation

## 2014-07-12 DIAGNOSIS — F329 Major depressive disorder, single episode, unspecified: Secondary | ICD-10-CM | POA: Insufficient documentation

## 2014-07-12 DIAGNOSIS — F419 Anxiety disorder, unspecified: Secondary | ICD-10-CM | POA: Insufficient documentation

## 2014-07-12 DIAGNOSIS — M1990 Unspecified osteoarthritis, unspecified site: Secondary | ICD-10-CM | POA: Insufficient documentation

## 2014-07-12 DIAGNOSIS — I10 Essential (primary) hypertension: Secondary | ICD-10-CM | POA: Insufficient documentation

## 2014-07-12 DIAGNOSIS — E063 Autoimmune thyroiditis: Secondary | ICD-10-CM | POA: Insufficient documentation

## 2014-07-12 DIAGNOSIS — K219 Gastro-esophageal reflux disease without esophagitis: Secondary | ICD-10-CM | POA: Insufficient documentation

## 2014-07-12 DIAGNOSIS — E785 Hyperlipidemia, unspecified: Secondary | ICD-10-CM | POA: Insufficient documentation

## 2014-07-12 DIAGNOSIS — E039 Hypothyroidism, unspecified: Secondary | ICD-10-CM | POA: Insufficient documentation

## 2014-07-12 DIAGNOSIS — N951 Menopausal and female climacteric states: Secondary | ICD-10-CM | POA: Insufficient documentation

## 2014-07-12 DIAGNOSIS — F32A Depression, unspecified: Secondary | ICD-10-CM | POA: Insufficient documentation

## 2014-08-16 ENCOUNTER — Other Ambulatory Visit: Payer: Self-pay | Admitting: Family Medicine

## 2014-09-05 ENCOUNTER — Ambulatory Visit (INDEPENDENT_AMBULATORY_CARE_PROVIDER_SITE_OTHER): Payer: Medicare Other | Admitting: Family Medicine

## 2014-09-05 VITALS — BP 124/68 | HR 80 | Temp 97.6°F | Resp 16 | Wt 127.0 lb

## 2014-09-05 DIAGNOSIS — E039 Hypothyroidism, unspecified: Secondary | ICD-10-CM

## 2014-09-05 DIAGNOSIS — I10 Essential (primary) hypertension: Secondary | ICD-10-CM | POA: Diagnosis not present

## 2014-09-05 DIAGNOSIS — K219 Gastro-esophageal reflux disease without esophagitis: Secondary | ICD-10-CM

## 2014-09-05 DIAGNOSIS — F419 Anxiety disorder, unspecified: Secondary | ICD-10-CM

## 2014-09-05 DIAGNOSIS — F330 Major depressive disorder, recurrent, mild: Secondary | ICD-10-CM | POA: Diagnosis not present

## 2014-09-05 MED ORDER — RANITIDINE HCL 150 MG PO TABS: 150.0000 mg | ORAL_TABLET | Freq: Two times a day (BID) | ORAL | Status: DC

## 2014-09-05 NOTE — Progress Notes (Signed)
Patient ID: Paige Baldwin, female   DOB: [birth year], 69 y.o.   MRN: [id number]    Subjective:  HPI  Hypertension, follow-up:  BP Readings from Last 3 Encounters:  09/05/14 124/68  03/21/14 126/78  06/25/14 131/83    She was last seen for hypertension 6 months ago.  BP at that visit was 126/78. Management changes since that visit include none. She reports good compliance with treatment. She is not having side effects.  She is exercising about 4-5 days a week. She is adherent to low salt diet.   Outside blood pressures are not being checked.. She is experiencing none.     Weight trend: stable Wt Readings from Last 3 Encounters:  09/05/14 127 lb (57.607 kg)  03/21/14 129 lb (58.514 kg)  06/25/14 125 lb (56.7 kg)    ------------------------------------------------------------------------  GERD, Follow up:  The patient was last seen for GERD 6 months ago. Changes made since that visit include none.  She reports good compliance with treatment. She is not having side effects. . Pt reports that once in a while she will have break through heart burn but not often. She does take her omeprazole daily.  ------------------------------------------------------------------------   Depression-- Pt reports that she is taking her Zoloft and doing well emotionally for the most part.   Prior to Admission medications   Medication Sig Start Date End Date Taking? Authorizing Provider  Calcium Carb-Cholecalciferol (CALCIUM PLUS VITAMIN D3 PO) Take 1 tablet by mouth daily.   Yes Historical Provider, MD  colesevelam (WELCHOL) 625 MG tablet Take 1,875 mg by mouth daily.    Yes Historical Provider, MD  estradiol (ESTRACE) 0.5 MG tablet Take 0.5 mg by mouth daily.   Yes Historical Provider, MD  levothyroxine (SYNTHROID, LEVOTHROID) 200 MCG tablet Take 200 mcg by mouth daily before breakfast.   Yes Historical Provider, MD  metoprolol (LOPRESSOR) 50 MG tablet Take 50 mg by mouth daily.   Yes  Historical Provider, MD  potassium chloride (K-DUR) 10 MEQ tablet Take 10 mEq by mouth daily.   Yes Historical Provider, MD  sertraline (ZOLOFT) 50 MG tablet Take 50 mg by mouth daily.   Yes Historical Provider, MD  naproxen sodium (ANAPROX) 220 MG tablet Take 220 mg by mouth 2 (two) times daily as needed (headache).     Historical Provider, MD    Patient Active Problem List   Diagnosis Date Noted  . Anxiety 07/12/2014  . Clinical depression 07/12/2014  . Acid reflux 07/12/2014  . Autoimmune lymphocytic chronic thyroiditis 07/12/2014  . HLD (hyperlipidemia) 07/12/2014  . BP (high blood pressure) 07/12/2014  . Adult hypothyroidism 07/12/2014  . Mild major depression 07/12/2014  . Arthritis, degenerative 07/12/2014  . Post menopausal syndrome 07/12/2014  . CMC arthritis, thumb, degenerative 06/25/2014    Past Medical History  Diagnosis Date  . Hypertension   . Hypothyroidism   . Depression   . GERD (gastroesophageal reflux disease)     History   Social History  . Marital Status: Married    Spouse Name: N/A  . Number of Children: N/A  . Years of Education: N/A   Occupational History  . Not on file.   Social History Main Topics  . Smoking status: Never Smoker   . Smokeless tobacco: Never Used  . Alcohol Use: No  . Drug Use: No  . Sexual Activity: Not on file   Other Topics Concern  . Not on file   Social History Narrative    No Known Allergies  Review of Systems  All other systems reviewed and are negative.   Immunization History  Administered Date(s) Administered  . Tdap 10/17/2006   Objective:  BP 124/68 mmHg  Pulse 80  Temp(Src) 97.6 F (36.4 C) (Oral)  Resp 16  Wt 127 lb (57.607 kg)  LMP  (LMP Unknown)  Physical Exam  Constitutional: She is oriented to person, place, and time and well-developed, well-nourished, and in no distress.  HENT:  Head: Normocephalic and atraumatic.  Right Ear: External ear normal.  Left Ear: External ear normal.    Nose: Nose normal.  Mouth/Throat: Oropharynx is clear and moist.  Eyes: Conjunctivae are normal.  Neck: Normal range of motion.  Cardiovascular: Normal rate, regular rhythm and normal heart sounds.   Pulmonary/Chest: Effort normal and breath sounds normal.  Abdominal: Soft.  Neurological: She is alert and oriented to person, place, and time.  Skin: Skin is warm and dry.  Psychiatric: Mood, memory, affect and judgment normal.    Lab Results  Component Value Date   WBC 5.5 06/18/2014   HGB 12.6 06/18/2014   HCT 38.7 06/18/2014   PLT 213 06/18/2014   CHOL 286* 03/22/2014   TRIG 118 03/22/2014   HDL 75* 03/22/2014   LDLCALC 187 03/22/2014   TSH 5.06 03/22/2014    CMP     Component Value Date/Time   NA 143 03/22/2014   K 4.3 03/22/2014   BUN 15 03/22/2014   CREATININE 0.8 03/22/2014   AST 15 03/22/2014   ALT 10 03/22/2014   ALKPHOS 98 03/22/2014    Assessment and Plan :  Hypertension Hypothyroidism GERD All issues stable. Return to clinic 6 months. Miguel Aschoff MD [facility] 09/05/2014 11:21 AM

## 2014-11-18 ENCOUNTER — Other Ambulatory Visit: Payer: Self-pay | Admitting: Family Medicine

## 2014-11-18 NOTE — Telephone Encounter (Signed)
Paige Baldwin patient 

## 2014-12-09 DIAGNOSIS — Z1283 Encounter for screening for malignant neoplasm of skin: Secondary | ICD-10-CM | POA: Diagnosis not present

## 2014-12-09 DIAGNOSIS — L812 Freckles: Secondary | ICD-10-CM | POA: Diagnosis not present

## 2014-12-09 DIAGNOSIS — L821 Other seborrheic keratosis: Secondary | ICD-10-CM | POA: Diagnosis not present

## 2014-12-09 DIAGNOSIS — D485 Neoplasm of uncertain behavior of skin: Secondary | ICD-10-CM | POA: Diagnosis not present

## 2014-12-09 DIAGNOSIS — D225 Melanocytic nevi of trunk: Secondary | ICD-10-CM | POA: Diagnosis not present

## 2014-12-09 DIAGNOSIS — L820 Inflamed seborrheic keratosis: Secondary | ICD-10-CM | POA: Diagnosis not present

## 2014-12-09 DIAGNOSIS — D1800 Hemangioma unspecified site: Secondary | ICD-10-CM | POA: Diagnosis not present

## 2014-12-09 DIAGNOSIS — L578 Other skin changes due to chronic exposure to nonionizing radiation: Secondary | ICD-10-CM | POA: Diagnosis not present

## 2014-12-23 DIAGNOSIS — E039 Hypothyroidism, unspecified: Secondary | ICD-10-CM | POA: Diagnosis not present

## 2014-12-25 DIAGNOSIS — R7989 Other specified abnormal findings of blood chemistry: Secondary | ICD-10-CM | POA: Diagnosis not present

## 2014-12-25 DIAGNOSIS — E039 Hypothyroidism, unspecified: Secondary | ICD-10-CM | POA: Diagnosis not present

## 2014-12-25 DIAGNOSIS — E559 Vitamin D deficiency, unspecified: Secondary | ICD-10-CM | POA: Diagnosis not present

## 2014-12-25 DIAGNOSIS — N951 Menopausal and female climacteric states: Secondary | ICD-10-CM | POA: Diagnosis not present

## 2014-12-25 DIAGNOSIS — I7090 Unspecified atherosclerosis: Secondary | ICD-10-CM | POA: Diagnosis not present

## 2015-01-04 ENCOUNTER — Ambulatory Visit (INDEPENDENT_AMBULATORY_CARE_PROVIDER_SITE_OTHER): Payer: Medicare Other

## 2015-01-04 DIAGNOSIS — Z23 Encounter for immunization: Secondary | ICD-10-CM | POA: Diagnosis not present

## 2015-01-07 ENCOUNTER — Other Ambulatory Visit: Payer: Self-pay | Admitting: Family Medicine

## 2015-01-13 DIAGNOSIS — E559 Vitamin D deficiency, unspecified: Secondary | ICD-10-CM | POA: Diagnosis not present

## 2015-01-13 DIAGNOSIS — E039 Hypothyroidism, unspecified: Secondary | ICD-10-CM | POA: Diagnosis not present

## 2015-02-27 DIAGNOSIS — R7989 Other specified abnormal findings of blood chemistry: Secondary | ICD-10-CM | POA: Diagnosis not present

## 2015-02-27 DIAGNOSIS — E063 Autoimmune thyroiditis: Secondary | ICD-10-CM | POA: Diagnosis not present

## 2015-03-11 ENCOUNTER — Encounter: Payer: Medicare Other | Admitting: Family Medicine

## 2015-05-15 ENCOUNTER — Encounter: Payer: Medicare Other | Admitting: Family Medicine

## 2015-05-19 DIAGNOSIS — Z1211 Encounter for screening for malignant neoplasm of colon: Secondary | ICD-10-CM | POA: Diagnosis not present

## 2015-05-20 ENCOUNTER — Other Ambulatory Visit: Payer: Self-pay | Admitting: Family Medicine

## 2015-05-21 ENCOUNTER — Other Ambulatory Visit: Payer: Self-pay | Admitting: Family Medicine

## 2015-05-21 DIAGNOSIS — F419 Anxiety disorder, unspecified: Secondary | ICD-10-CM

## 2015-05-22 ENCOUNTER — Other Ambulatory Visit: Payer: Self-pay | Admitting: Family Medicine

## 2015-06-06 ENCOUNTER — Other Ambulatory Visit: Payer: Self-pay | Admitting: Family Medicine

## 2015-06-16 DIAGNOSIS — R739 Hyperglycemia, unspecified: Secondary | ICD-10-CM | POA: Diagnosis not present

## 2015-06-16 DIAGNOSIS — R7989 Other specified abnormal findings of blood chemistry: Secondary | ICD-10-CM | POA: Diagnosis not present

## 2015-06-16 DIAGNOSIS — E039 Hypothyroidism, unspecified: Secondary | ICD-10-CM | POA: Diagnosis not present

## 2015-06-16 DIAGNOSIS — E559 Vitamin D deficiency, unspecified: Secondary | ICD-10-CM | POA: Diagnosis not present

## 2015-06-26 ENCOUNTER — Ambulatory Visit (INDEPENDENT_AMBULATORY_CARE_PROVIDER_SITE_OTHER): Payer: Medicare Other | Admitting: Family Medicine

## 2015-06-26 VITALS — BP 142/62 | HR 68 | Temp 97.5°F | Resp 16 | Ht 60.0 in | Wt 123.0 lb

## 2015-06-26 DIAGNOSIS — Z0000 Encounter for general adult medical examination without abnormal findings: Secondary | ICD-10-CM | POA: Diagnosis not present

## 2015-06-26 DIAGNOSIS — I10 Essential (primary) hypertension: Secondary | ICD-10-CM

## 2015-06-26 DIAGNOSIS — Z87898 Personal history of other specified conditions: Secondary | ICD-10-CM

## 2015-06-26 DIAGNOSIS — Z9189 Other specified personal risk factors, not elsewhere classified: Secondary | ICD-10-CM

## 2015-06-26 DIAGNOSIS — Z780 Asymptomatic menopausal state: Secondary | ICD-10-CM

## 2015-06-26 DIAGNOSIS — M8580 Other specified disorders of bone density and structure, unspecified site: Secondary | ICD-10-CM

## 2015-06-26 DIAGNOSIS — Z23 Encounter for immunization: Secondary | ICD-10-CM | POA: Diagnosis not present

## 2015-06-26 MED ORDER — LOSARTAN POTASSIUM 25 MG PO TABS: 25.0000 mg | ORAL_TABLET | Freq: Every day | ORAL | Status: DC

## 2015-06-26 NOTE — Progress Notes (Signed)
Patient ID: Paige Baldwin, female   DOB: March 08, 1945, 70 y.o.   MRN: [id number] Patient: Paige Baldwin, Female    DOB: [birth year], 70 y.o.   MRN: [id number] Visit Date: 06/26/2015  Today's Provider: [name], MD   Chief Complaint  Patient presents with  . Annual Exam   Subjective:   Paige Baldwin is a 70 y.o. female who presents today for her Subsequent Annual Wellness Visit. She feels well. She reports exercising daily. She reports she is sleeping well. Patient is doing well with her blood pressure and her depression. She is having no problems with any bones but she has not had a bone density. She is a slender white female and is at higher risk for osteoporosis because of this.  Immunization History  Administered Date(s) Administered  . Influenza, High Dose Seasonal PF 01/04/2015  . Tdap 10/17/2006   03/29/14 Mamm 06/06/12 Colon-hemorrhoids, lymphatic colitis 11/12/08 BMD 01/11/05 Endo  Review of Systems  Constitutional: Negative.   HENT: Negative.   Eyes: Negative.   Respiratory: Negative.   Cardiovascular: Negative.   Gastrointestinal: Negative.   Endocrine: Negative.   Genitourinary: Negative.   Musculoskeletal: Negative.   Skin: Negative.   Allergic/Immunologic: Negative.   Neurological: Negative.   Hematological: Negative.   Psychiatric/Behavioral: Negative.     Patient Active Problem List   Diagnosis Date Noted  . Anxiety 07/12/2014  . Clinical depression 07/12/2014  . Acid reflux 07/12/2014  . Autoimmune lymphocytic chronic thyroiditis 07/12/2014  . HLD (hyperlipidemia) 07/12/2014  . BP (high blood pressure) 07/12/2014  . Adult hypothyroidism 07/12/2014  . Mild major depression ([place]) 07/12/2014  . Arthritis, degenerative 07/12/2014  . Post menopausal syndrome 07/12/2014  . CMC arthritis, thumb, degenerative 06/25/2014    Social History   Social History  . Marital Status: Married    Spouse Name: N/A  . Number of Children: N/A  . Years of  Education: N/A   Occupational History  . Not on file.   Social History Main Topics  . Smoking status: Never Smoker   . Smokeless tobacco: Never Used  . Alcohol Use: No  . Drug Use: No  . Sexual Activity: Not on file   Other Topics Concern  . Not on file   Social History Narrative    Past Surgical History  Procedure Laterality Date  . Abdominal hysterectomy    . Cholecystectomy      removed appendix at this time  . Appendectomy      during cholecystectomy  . Finger arthroplasty Right 06/25/2014    Procedure: FINGER ARTHROPLASTY;  Surgeon: Christophe Louis, MD;  Location: [facility] ORS;  Service: Orthopedics;  Laterality: Right;  . Back surgery    . Salpingoophorectomy      Her family history includes Anemia in her brother; CAD in her brother; Congestive Heart Failure in her father; Diabetes in her father; Heart disease in her father; Hypertension in her brother and father; Stroke in her paternal uncle.    Outpatient Prescriptions Prior to Visit  Medication Sig Dispense Refill  . Calcium Carb-Cholecalciferol (CALCIUM PLUS VITAMIN D3 PO) Take 1 tablet by mouth daily.    . colesevelam (WELCHOL) 625 MG tablet Take 1,875 mg by mouth daily.     Marland Kitchen estradiol (ESTRACE) 0.5 MG tablet Take 0.5 mg by mouth daily.    Marland Kitchen levothyroxine (SYNTHROID, LEVOTHROID) 200 MCG tablet Take 200 mcg by mouth daily before breakfast.    . metoprolol (LOPRESSOR) 50 MG tablet TAKE ONE TABLET BY MOUTH  EVERY DAY 30 tablet 12  . naproxen sodium (ANAPROX) 220 MG tablet Take 220 mg by mouth 2 (two) times daily as needed (headache).     Marland Kitchen omeprazole (PRILOSEC) 40 MG capsule TAKE ONE CAPSULE BY MOUTH EVERY DAY 30 MINUTES BEFORE BREAKFAST 30 capsule 12  . potassium chloride (K-DUR,KLOR-CON) 10 MEQ tablet TAKE ONE TABLET BY MOUTH EVERY DAY 30 tablet 12  . potassium chloride (K-DUR) 10 MEQ tablet Take 10 mEq by mouth daily.    . ranitidine (ZANTAC) 150 MG tablet Take 1 tablet (150 mg total) by mouth 2 (two) times daily.  60 tablet 12  . sertraline (ZOLOFT) 100 MG tablet TAKE ONE TABLET BY MOUTH EVERY DAY 90 tablet 3  . sertraline (ZOLOFT) 50 MG tablet Take 50 mg by mouth daily.     No facility-administered medications prior to visit.    No Known Allergies  Patient Care Team: Jerrol Banana., MD as PCP - General (Family Medicine)  Objective:   Vitals:  Filed Vitals:   06/26/15 1016  BP: 142/62  Pulse: 68  Temp: 97.5 F (36.4 C)  TempSrc: Oral  Resp: 16  Height: 5' (1.524 m)  Weight: 123 lb (55.792 kg)    Physical Exam  Constitutional: She is oriented to person, place, and time. She appears well-developed and well-nourished.  HENT:  Head: Normocephalic and atraumatic.  Right Ear: External ear normal.  Left Ear: External ear normal.  Nose: Nose normal.  Mouth/Throat: Oropharynx is clear and moist.  Eyes: Conjunctivae and EOM are normal. Pupils are equal, round, and reactive to light.  Neck: Normal range of motion. Neck supple.  Cardiovascular: Normal rate, regular rhythm, normal heart sounds and intact distal pulses.   Pulmonary/Chest: Effort normal and breath sounds normal.  Abdominal: Soft. Bowel sounds are normal.  Musculoskeletal: Normal range of motion.  Neurological: She is alert and oriented to person, place, and time.  Skin: Skin is warm and dry.  Psychiatric: She has a normal mood and affect. Her behavior is normal. Judgment and thought content normal.    Activities of Daily Living In your present state of health, do you have any difficulty performing the following activities: 06/26/2015  Hearing? N  Vision? N  Difficulty concentrating or making decisions? N  Walking or climbing stairs? N  Dressing or bathing? N  Doing errands, shopping? N    Fall Risk Assessment Fall Risk  06/26/2015  Falls in the past year? No     Depression Screen PHQ 2/9 Scores 06/26/2015  PHQ - 2 Score 0    Cognitive Testing - 6-CIT    Year: 0 4 points  Month: 0 3 points  Memorize  "Pia Mau, [address], [place]"  Time (within 1 hour:) 0 3 points  Count backwards from 20: 0 2 4 points  Name months of year: 0 2 4 points  Repeat Address: 0 2 4 6 8 10  points   Total Score: 0/28  Interpretation : Normal (0-7) Abnormal (8-28)    Assessment & Plan:     Annual Wellness Visit  Reviewed patient's Family Medical History Reviewed and updated list of patient's medical providers Assessment of cognitive impairment was done Assessed patient's functional ability Established a written schedule for health screening [place] Completed and Reviewed  Exercise Activities and Dietary recommendations Goals    None      Immunization History  Administered Date(s) Administered  . Influenza, High Dose Seasonal PF 01/04/2015  . Tdap 10/17/2006  Health Maintenance  Topic Date Due  . Hepatitis C Screening  1945-06-01  . ZOSTAVAX  11/14/2004  . PNA vac Low Risk Adult (1 of 2 - PCV13) 11/14/2009  . INFLUENZA VACCINE  09/23/2015  . MAMMOGRAM  03/29/2016  . TETANUS/TDAP  10/16/2016  . COLONOSCOPY  06/07/2022  . DEXA SCAN  Completed      Discussed health benefits of physical activity, and encouraged her to engage in regular exercise appropriate for her age and condition.  Depression Controlled. Hypertension Postmenopausal Check BMD I have done the exam and reviewed the above chart and it is accurate to the best of my knowledge.   Miguel Aschoff MD [facility] 06/26/2015 10:19 AM  ------------------------------------------------------------------------------------------------------------

## 2015-06-30 ENCOUNTER — Encounter
Admission: RE | Disposition: A | Discharge status: Home / Self Care | Payer: Self-pay | Source: Ambulatory Visit | Attending: Unknown Physician Specialty

## 2015-06-30 ENCOUNTER — Encounter: Payer: Self-pay | Admitting: *Deleted

## 2015-06-30 ENCOUNTER — Ambulatory Visit
Admission: RE | Admit: 2015-06-30 | Discharge: 2015-06-30 | Disposition: A | Discharge status: Home / Self Care | Payer: Medicare Other | Source: Ambulatory Visit | Attending: Unknown Physician Specialty | Admitting: Unknown Physician Specialty

## 2015-06-30 ENCOUNTER — Ambulatory Visit: Payer: Medicare Other | Admitting: Anesthesiology

## 2015-06-30 DIAGNOSIS — I10 Essential (primary) hypertension: Secondary | ICD-10-CM | POA: Insufficient documentation

## 2015-06-30 DIAGNOSIS — K635 Polyp of colon: Secondary | ICD-10-CM | POA: Diagnosis not present

## 2015-06-30 DIAGNOSIS — Z1211 Encounter for screening for malignant neoplasm of colon: Secondary | ICD-10-CM | POA: Insufficient documentation

## 2015-06-30 DIAGNOSIS — D127 Benign neoplasm of rectosigmoid junction: Secondary | ICD-10-CM | POA: Insufficient documentation

## 2015-06-30 DIAGNOSIS — Z96691 Finger-joint replacement of right hand: Secondary | ICD-10-CM | POA: Diagnosis not present

## 2015-06-30 DIAGNOSIS — E039 Hypothyroidism, unspecified: Secondary | ICD-10-CM | POA: Diagnosis not present

## 2015-06-30 DIAGNOSIS — Z79899 Other long term (current) drug therapy: Secondary | ICD-10-CM | POA: Insufficient documentation

## 2015-06-30 DIAGNOSIS — K219 Gastro-esophageal reflux disease without esophagitis: Secondary | ICD-10-CM | POA: Insufficient documentation

## 2015-06-30 DIAGNOSIS — K648 Other hemorrhoids: Secondary | ICD-10-CM | POA: Diagnosis not present

## 2015-06-30 DIAGNOSIS — K640 First degree hemorrhoids: Secondary | ICD-10-CM | POA: Diagnosis not present

## 2015-06-30 DIAGNOSIS — F329 Major depressive disorder, single episode, unspecified: Secondary | ICD-10-CM | POA: Insufficient documentation

## 2015-06-30 HISTORY — PX: COLONOSCOPY WITH PROPOFOL: SHX5780

## 2015-06-30 LAB — SURGICAL PATHOLOGY

## 2015-06-30 LAB — HM COLONOSCOPY

## 2015-06-30 SURGERY — COLONOSCOPY WITH PROPOFOL
Anesthesia: General

## 2015-06-30 MED ORDER — METOPROLOL TARTRATE 50 MG PO TABS
ORAL_TABLET | ORAL | Status: AC
  Administered 2015-06-30: 50 mg via ORAL
  Filled 2015-06-30: qty 1

## 2015-06-30 MED ORDER — PROPOFOL 500 MG/50ML IV EMUL
INTRAVENOUS | Status: DC | PRN
  Administered 2015-06-30: 50 ug/kg/min via INTRAVENOUS

## 2015-06-30 MED ORDER — PROPOFOL 10 MG/ML IV BOLUS
INTRAVENOUS | Status: DC | PRN
  Administered 2015-06-30 (×2): 10 mg via INTRAVENOUS
  Administered 2015-06-30: 30 mg via INTRAVENOUS

## 2015-06-30 MED ORDER — SODIUM CHLORIDE 0.9 % IV SOLN
INTRAVENOUS | Status: DC
  Administered 2015-06-30: 1000 mL via INTRAVENOUS

## 2015-06-30 MED ORDER — LABETALOL HCL 5 MG/ML IV SOLN
INTRAVENOUS | Status: DC | PRN
  Administered 2015-06-30: 5 mg via INTRAVENOUS

## 2015-06-30 MED ORDER — MIDAZOLAM HCL 5 MG/5ML IJ SOLN
INTRAMUSCULAR | Status: DC | PRN
Start: 2015-06-30 — End: 2015-06-30
  Administered 2015-06-30: 1 mg via INTRAVENOUS

## 2015-06-30 MED ORDER — LIDOCAINE HCL (PF) 2 % IJ SOLN
INTRAMUSCULAR | Status: DC | PRN
  Administered 2015-06-30: 50 mg

## 2015-06-30 MED ORDER — FENTANYL CITRATE (PF) 100 MCG/2ML IJ SOLN
INTRAMUSCULAR | Status: DC | PRN
  Administered 2015-06-30: 50 ug via INTRAVENOUS

## 2015-06-30 MED ORDER — SODIUM CHLORIDE 0.9 % IV SOLN: INTRAVENOUS | Status: DC

## 2015-06-30 NOTE — Anesthesia Postprocedure Evaluation (Signed)
Anesthesia Post Note  Patient: Paige Baldwin  Procedure(s) Performed: Procedure(s) (LRB): COLONOSCOPY WITH PROPOFOL (N/A)  Patient location during evaluation: Endoscopy Anesthesia Type: General Level of consciousness: awake and alert Pain management: pain level controlled Vital Signs Assessment: post-procedure vital signs reviewed and stable Respiratory status: spontaneous breathing, nonlabored ventilation, respiratory function stable and patient connected to nasal cannula oxygen Cardiovascular status: blood pressure returned to baseline and stable Postop Assessment: no signs of nausea or vomiting Anesthetic complications: no    Last Vitals:  Filed Vitals:   06/30/15 1350 06/30/15 1357  BP: 148/75 158/73  Pulse: 69 68  Temp:    Resp: 21 16    Last Pain: There were no vitals filed for this visit.               Precious Haws Alayla Dethlefs

## 2015-06-30 NOTE — H&P (Signed)
   Primary Care Physician:  [name], MD Primary Gastroenterologist:  Dr. Vira Agar  Pre-Procedure History & Physical: HPI:  Paige Baldwin is a 70 y.o. female is here for an colonoscopy.   Past Medical History  Diagnosis Date  . Hypertension   . Hypothyroidism   . GERD (gastroesophageal reflux disease)   . Depression     Past Surgical History  Procedure Laterality Date  . Abdominal hysterectomy    . Cholecystectomy      removed appendix at this time  . Appendectomy      during cholecystectomy  . Finger arthroplasty Right 06/25/2014    Procedure: FINGER ARTHROPLASTY;  Surgeon: Christophe Louis, MD;  Location: [facility] ORS;  Service: Orthopedics;  Laterality: Right;  . Salpingoophorectomy    . Back surgery      Prior to Admission medications   Medication Sig Start Date End Date Taking? Authorizing Provider  estradiol (ESTRACE) 0.5 MG tablet Take 0.5 mg by mouth daily.    Historical Provider, MD  levothyroxine (SYNTHROID, LEVOTHROID) 200 MCG tablet Take 200 mcg by mouth daily before breakfast.    Historical Provider, MD  metoprolol (LOPRESSOR) 50 MG tablet TAKE ONE TABLET BY MOUTH EVERY DAY 01/07/15   Jerrol Banana., MD  omeprazole (PRILOSEC) 40 MG capsule TAKE ONE CAPSULE BY MOUTH EVERY DAY 30 MINUTES BEFORE BREAKFAST 06/08/15   Richard Maceo Pro., MD  potassium chloride (K-DUR,KLOR-CON) 10 MEQ tablet TAKE ONE TABLET BY MOUTH EVERY DAY 05/22/15   Jerrol Banana., MD    Allergies as of 06/20/2015  . (No Known Allergies)    Family History  Problem Relation Age of Onset  . Hypertension Father   . Heart disease Father   . Diabetes Father   . Congestive Heart Failure Father   . Hypertension Brother   . CAD Brother   . Anemia Brother   . Stroke Paternal Uncle     Social History   Social History  . Marital Status: Married    Spouse Name: N/A  . Number of Children: N/A  . Years of Education: N/A   Occupational History  . Not on file.   Social  History Main Topics  . Smoking status: Never Smoker   . Smokeless tobacco: Never Used  . Alcohol Use: No  . Drug Use: No  . Sexual Activity: Not on file   Other Topics Concern  . Not on file   Social History Narrative    Review of Systems: See HPI, otherwise negative ROS  Physical Exam: BP 171/87 mmHg  Pulse 93  Temp(Src) 98.1 F (36.7 C) (Tympanic)  Resp 16  SpO2 100%  LMP  (LMP Unknown) General:   Alert,  pleasant and cooperative in NAD Head:  Normocephalic and atraumatic. Neck:  Supple; no masses or thyromegaly. Lungs:  Clear throughout to auscultation.    Heart:  Regular rate and rhythm. Abdomen:  Soft, nontender and nondistended. Normal bowel sounds, without guarding, and without rebound.   Neurologic:  Alert and  oriented x4;  grossly normal neurologically.  Impression/Plan: [name] is here for an colonoscopy to be performed for screening  Risks, benefits, limitations, and alternatives regarding  colonoscopy have been reviewed with the patient.  Questions have been answered.  All parties agreeable.   Gaylyn Cheers, MD  06/30/2015, 12:52 PM

## 2015-06-30 NOTE — Op Note (Signed)
[facility] Gastroenterology Patient Name: Paige Baldwin Procedure Date: 06/30/2015 12:54 PM MRN: [id number] Account #: [id number] Date of Birth: 14-May-1945 Admit Type: Outpatient Age: 70 Room: [facility] ENDO ROOM 1 Gender: Female Note Status: Finalized Procedure:            Colonoscopy Indications:          Screening for colorectal malignant neoplasm Providers:            Manya Silvas, MD Referring MD:         Janine Ores. Rosanna Randy, MD (Referring MD) Medicines:            Propofol per Anesthesia Complications:        No immediate complications. Procedure:            Pre-Anesthesia Assessment:                       - After reviewing the risks and benefits, the patient                        was deemed in satisfactory condition to undergo the                        procedure.                       After obtaining informed consent, the colonoscope was                        passed under direct vision. Throughout the procedure,                        the patient's blood pressure, pulse, and oxygen                        saturations were monitored continuously. The                        Colonoscope was introduced through the anus and                        advanced to the the cecum, identified by appendiceal                        orifice and ileocecal valve. The colonoscopy was                        performed without difficulty. The patient tolerated the                        procedure well. The quality of the bowel preparation                        was excellent. Findings:      The colon was long and redundant requiring abd pressure but the cecum       was reached.      A diminutive polyp was found in the recto-sigmoid colon. The polyp was       sessile. The polyp was removed with a jumbo cold forceps. Resection and       retrieval were complete.      The exam was otherwise without abnormality.  Internal hemorrhoids were found during endoscopy. The hemorrhoids  were       small and Grade I (internal hemorrhoids that do not prolapse). Impression:           - One diminutive polyp at the recto-sigmoid colon,                        removed with a jumbo cold forceps. Resected and                        retrieved.                       - The examination was otherwise normal.                       - Internal hemorrhoids. Recommendation:       - Await pathology results. Manya Silvas, MD 06/30/2015 1:27:01 PM This report has been signed electronically. Number of Addenda: 0 Note Initiated On: 06/30/2015 12:54 PM Scope Withdrawal Time: 0 hours 12 minutes 8 seconds  Total Procedure Duration: 0 hours 25 minutes 35 seconds       [facility]

## 2015-06-30 NOTE — Anesthesia Preprocedure Evaluation (Signed)
Anesthesia Evaluation  Patient identified by MRN, date of birth, ID band Patient awake    Reviewed: Allergy & Precautions, H&P , NPO status , Patient's Chart, lab work & pertinent test results  History of Anesthesia Complications Negative for: history of anesthetic complications  Airway Mallampati: III  TM Distance: >3 FB Neck ROM: limited    Dental  (+) Teeth Intact   Pulmonary neg pulmonary ROS, neg shortness of breath,    Pulmonary exam normal breath sounds clear to auscultation       Cardiovascular Exercise Tolerance: Good hypertension, (-) angina(-) Past MI and (-) DOE Normal cardiovascular exam Rhythm:regular Rate:Normal     Neuro/Psych PSYCHIATRIC DISORDERS Anxiety Depression negative neurological ROS     GI/Hepatic Neg liver ROS, GERD  Controlled,  Endo/Other  Hypothyroidism   Renal/GU negative Renal ROS  negative genitourinary   Musculoskeletal  (+) Arthritis ,   Abdominal   Peds  Hematology negative hematology ROS (+)   Anesthesia Other Findings Past Medical History:   Hypertension                                                 Hypothyroidism                                               GERD (gastroesophageal reflux disease)                       Depression                                                  Past Surgical History:   ABDOMINAL HYSTERECTOMY                                        CHOLECYSTECTOMY                                                 Comment:removed appendix at this time   APPENDECTOMY                                                    Comment:during cholecystectomy   FINGER ARTHROPLASTY                             Right 06/25/2014       Comment:Procedure: FINGER ARTHROPLASTY;  Surgeon:               Christophe Louis, MD;  Location: [facility] ORS;                Service: Orthopedics;  Laterality: Right;   SALPINGOOPHORECTOMY  BACK SURGERY                                                     Reproductive/Obstetrics negative OB ROS                             Anesthesia Physical Anesthesia Plan  ASA: III  Anesthesia Plan: General   Post-op Pain Management:    Induction:   Airway Management Planned:   Additional Equipment:   Intra-op Plan:   Post-operative Plan:   Informed Consent: I have reviewed the patients History and Physical, chart, labs and discussed the procedure including the risks, benefits and alternatives for the proposed anesthesia with the patient or authorized representative who has indicated his/her understanding and acceptance.   Dental Advisory Given  Plan Discussed with: Anesthesiologist, CRNA and Surgeon  Anesthesia Plan Comments:         Anesthesia Quick Evaluation

## 2015-06-30 NOTE — Transfer of Care (Signed)
Immediate Anesthesia Transfer of Care Note  Patient: Paige Baldwin  Procedure(s) Performed: Procedure(s): COLONOSCOPY WITH PROPOFOL (N/A)  Patient Location: PACU  Anesthesia Type:General  Level of Consciousness: sedated  Airway & Oxygen Therapy: Patient Spontanous Breathing and Patient connected to nasal cannula oxygen  Post-op Assessment: Report given to RN and Post -op Vital signs reviewed and stable  Post vital signs: Reviewed and stable  Last Vitals:  Filed Vitals:   06/30/15 1206  BP: 171/87  Pulse: 93  Temp: 36.7 C  Resp: 16    Last Pain: There were no vitals filed for this visit.       Complications: No apparent anesthesia complications

## 2015-07-01 ENCOUNTER — Encounter: Payer: Self-pay | Admitting: Unknown Physician Specialty

## 2015-07-16 ENCOUNTER — Ambulatory Visit
Admission: RE | Admit: 2015-07-16 | Discharge: 2015-07-16 | Disposition: A | Discharge status: Home / Self Care | Payer: Medicare Other | Source: Ambulatory Visit | Attending: Family Medicine | Admitting: Family Medicine

## 2015-07-16 DIAGNOSIS — Z87898 Personal history of other specified conditions: Secondary | ICD-10-CM | POA: Diagnosis not present

## 2015-07-16 DIAGNOSIS — Z780 Asymptomatic menopausal state: Secondary | ICD-10-CM | POA: Diagnosis present

## 2015-07-16 DIAGNOSIS — Z9189 Other specified personal risk factors, not elsewhere classified: Secondary | ICD-10-CM

## 2015-07-16 DIAGNOSIS — M8580 Other specified disorders of bone density and structure, unspecified site: Secondary | ICD-10-CM

## 2015-07-16 DIAGNOSIS — M85851 Other specified disorders of bone density and structure, right thigh: Secondary | ICD-10-CM | POA: Insufficient documentation

## 2015-08-28 ENCOUNTER — Ambulatory Visit (INDEPENDENT_AMBULATORY_CARE_PROVIDER_SITE_OTHER): Payer: Medicare Other | Admitting: Family Medicine

## 2015-08-28 ENCOUNTER — Encounter: Payer: Self-pay | Admitting: Family Medicine

## 2015-08-28 ENCOUNTER — Ambulatory Visit: Payer: Medicare Other | Admitting: Family Medicine

## 2015-08-28 VITALS — BP 120/62 | HR 72 | Temp 97.9°F | Resp 16 | Wt 123.0 lb

## 2015-08-28 DIAGNOSIS — E785 Hyperlipidemia, unspecified: Secondary | ICD-10-CM | POA: Diagnosis not present

## 2015-08-28 DIAGNOSIS — E039 Hypothyroidism, unspecified: Secondary | ICD-10-CM | POA: Diagnosis not present

## 2015-08-28 DIAGNOSIS — I10 Essential (primary) hypertension: Secondary | ICD-10-CM | POA: Diagnosis not present

## 2015-08-28 NOTE — Progress Notes (Signed)
Patient: Paige Baldwin Female    DOB: May 19, 1945   70 y.o.   MRN: [id number] Visit Date: 08/28/2015  Today's Provider: [name], MD   Chief Complaint  Patient presents with  . Hypertension  . Hypothyroidism  . Hyperlipidemia   Subjective:    HPI      Hypertension, follow-up:  BP Readings from Last 3 Encounters:  08/28/15 120/62  06/30/15 158/73  06/26/15 142/62    She was last seen for hypertension 2 months ago.  BP at that visit was 142/62. Management since that visit includes none. She reports excellent compliance with treatment. She is not having side effects. She is exercising. Walks 3-4 times per week. She is adherent to low salt diet.   Outside blood pressures are not being checked. She is experiencing none.  Patient denies chest pain, chest pressure/discomfort, claudication, dyspnea, exertional chest pressure/discomfort, fatigue, irregular heart beat, lower extremity edema, near-syncope, orthopnea, palpitations and syncope.   Cardiovascular risk factors include advanced age (older than 99 for men, 66 for women), dyslipidemia, family history of premature cardiovascular disease and hypertension.    Weight trend: stable Wt Readings from Last 3 Encounters:  08/28/15 123 lb (55.792 kg)  06/26/15 123 lb (55.792 kg)  09/05/14 127 lb (57.607 kg)    Current diet: in general, a "healthy" diet    ------------------------------------------------------------------------    Hypothyroid, follow-up:  TSH  Date Value Ref Range Status  03/22/2014 5.06 .41 - 5.90 uIU/mL Final   Wt Readings from Last 3 Encounters:  08/28/15 123 lb (55.792 kg)  06/26/15 123 lb (55.792 kg)  09/05/14 127 lb (57.607 kg)    She was last seen for hypothyroid 2 months ago.  Management since that visit includes none. She reports excellent compliance with treatment. She is not having side effects.  She is exercising. She is experiencing none She denies change in  energy level, diarrhea, heat / cold intolerance, nervousness, palpitations and weight changes Weight trend: stable  ------------------------------------------------------------------------    Lipid/Cholesterol, Follow-up:   Last seen for this 2 months ago.  Management changes since that visit include none. . Last Lipid Panel:    Component Value Date/Time   CHOL 286* 03/22/2014   TRIG 118 03/22/2014   HDL 75* 03/22/2014   LDLCALC 187 03/22/2014    Wt Readings from Last 3 Encounters:  08/28/15 123 lb (55.792 kg)  06/26/15 123 lb (55.792 kg)  09/05/14 127 lb (57.607 kg)    -------------------------------------------------------------------    No Known Allergies Current Meds  Medication Sig  . estradiol (ESTRACE) 0.5 MG tablet Take 0.5 mg by mouth daily.  Marland Kitchen levothyroxine (SYNTHROID, LEVOTHROID) 200 MCG tablet Take 200 mcg by mouth daily before breakfast.  . metoprolol (LOPRESSOR) 50 MG tablet TAKE ONE TABLET BY MOUTH EVERY DAY  . omeprazole (PRILOSEC) 40 MG capsule TAKE ONE CAPSULE BY MOUTH EVERY DAY 30 MINUTES BEFORE BREAKFAST  . potassium chloride (K-DUR,KLOR-CON) 10 MEQ tablet TAKE ONE TABLET BY MOUTH EVERY DAY    Review of Systems  Constitutional: Negative for fever, chills, diaphoresis, activity change, appetite change, fatigue and unexpected weight change.  Respiratory: Negative for cough, shortness of breath and wheezing.   Cardiovascular: Negative for chest pain, palpitations and leg swelling.  Endocrine: Negative for cold intolerance and heat intolerance.    Social History  Substance Use Topics  . Smoking status: Never Smoker   . Smokeless tobacco: Never Used  . Alcohol Use: No   Objective:  BP 120/62 mmHg  Pulse 72  Temp(Src) 97.9 F (36.6 C) (Oral)  Resp 16  Wt 123 lb (55.792 kg)  LMP  (LMP Unknown)  Physical Exam  Constitutional: She is oriented to person, place, and time. She appears well-developed and well-nourished.  HENT:  Head:  Normocephalic and atraumatic.  Eyes: Conjunctivae are normal.  Neck: Neck supple.  Cardiovascular: Normal rate, regular rhythm and normal heart sounds.   Pulmonary/Chest: Effort normal and breath sounds normal. No respiratory distress.  Abdominal: Soft.  Neurological: She is alert and oriented to person, place, and time.  Skin: Skin is warm and dry.  Psychiatric: She has a normal mood and affect. Her behavior is normal. Judgment and thought content normal.        Assessment & Plan:     1. Essential hypertension Stable. Continue current medications. - CBC with Differential/Platelet - Comprehensive metabolic panel  2. HLD (hyperlipidemia) Pt reports she is taking cholesterol medication. Unsure of the name and unable to find in the records. Will check labs, FU pending results. Pt will call with name of medication. - Lipid panel  3. Hypothyroidism, unspecified hypothyroidism type Will check labs. FU pending results. - TSH     Patient seen and examined by Miguel Aschoff, MD, and note scribed by Renaldo Fiddler, CMA.  I have done the exam and reviewed the above chart and it is accurate to the best of my knowledge.  Richard Cranford Mon, MD  [facility]

## 2015-08-29 DIAGNOSIS — E785 Hyperlipidemia, unspecified: Secondary | ICD-10-CM | POA: Diagnosis not present

## 2015-08-29 DIAGNOSIS — I10 Essential (primary) hypertension: Secondary | ICD-10-CM | POA: Diagnosis not present

## 2015-08-29 DIAGNOSIS — E039 Hypothyroidism, unspecified: Secondary | ICD-10-CM | POA: Diagnosis not present

## 2015-08-30 LAB — COMPREHENSIVE METABOLIC PANEL
A/G RATIO: 1.6 (ref 1.2–2.2)
ALBUMIN: 4.2 g/dL (ref 3.5–4.8)
ALK PHOS: 84 IU/L (ref 39–117)
ALT: 11 IU/L (ref 0–32)
AST: 13 IU/L (ref 0–40)
BILIRUBIN TOTAL: 0.3 mg/dL (ref 0.0–1.2)
BUN / CREAT RATIO: 19 (ref 12–28)
BUN: 16 mg/dL (ref 8–27)
CO2: 24 mmol/L (ref 18–29)
Calcium: 9 mg/dL (ref 8.7–10.3)
Chloride: 101 mmol/L (ref 96–106)
Creatinine, Ser: 0.85 mg/dL (ref 0.57–1.00)
GFR calc Af Amer: 80 mL/min/{1.73_m2} (ref >59)
GFR calc non Af Amer: 70 mL/min/{1.73_m2} (ref >59)
GLOBULIN, TOTAL: 2.7 g/dL (ref 1.5–4.5)
Glucose: 96 mg/dL (ref 65–99)
POTASSIUM: 4.9 mmol/L (ref 3.5–5.2)
SODIUM: 142 mmol/L (ref 134–144)
Total Protein: 6.9 g/dL (ref 6.0–8.5)

## 2015-08-30 LAB — CBC WITH DIFFERENTIAL/PLATELET
Basophils Absolute: 0 10*3/uL (ref 0.0–0.2)
Basos: 0 %
EOS (ABSOLUTE): 0.1 10*3/uL (ref 0.0–0.4)
EOS: 1 %
HEMATOCRIT: 39.1 % (ref 34.0–46.6)
Hemoglobin: 12.1 g/dL (ref 11.1–15.9)
Immature Grans (Abs): 0 10*3/uL (ref 0.0–0.1)
Immature Granulocytes: 0 %
LYMPHS ABS: 2.2 10*3/uL (ref 0.7–3.1)
Lymphs: 37 %
MCH: 26.7 pg (ref 26.6–33.0)
MCHC: 30.9 g/dL — AB (ref 31.5–35.7)
MCV: 86 fL (ref 79–97)
MONOS ABS: 1 10*3/uL — AB (ref 0.1–0.9)
Monocytes: 16 %
NEUTROS ABS: 2.8 10*3/uL (ref 1.4–7.0)
Neutrophils: 46 %
Platelets: 203 10*3/uL (ref 150–379)
RBC: 4.53 x10E6/uL (ref 3.77–5.28)
RDW: 13.9 % (ref 12.3–15.4)
WBC: 6 10*3/uL (ref 3.4–10.8)

## 2015-08-30 LAB — LIPID PANEL
Chol/HDL Ratio: 3.9 ratio units (ref 0.0–4.4)
Cholesterol, Total: 246 mg/dL — ABNORMAL HIGH (ref 100–199)
HDL: 63 mg/dL (ref >39)
LDL Calculated: 149 mg/dL — ABNORMAL HIGH (ref 0–99)
TRIGLYCERIDES: 172 mg/dL — AB (ref 0–149)
VLDL CHOLESTEROL CAL: 34 mg/dL (ref 5–40)

## 2015-08-30 LAB — TSH: TSH: 0.185 u[IU]/mL — AB (ref 0.450–4.500)

## 2015-09-01 ENCOUNTER — Telehealth: Payer: Self-pay

## 2015-09-01 MED ORDER — LEVOTHYROXINE SODIUM 175 MCG PO TABS: 175.0000 ug | ORAL_TABLET | Freq: Every day | ORAL | Status: DC

## 2015-09-01 NOTE — Telephone Encounter (Signed)
-----   Message from Jerrol Banana., MD sent at 09/01/2015  3:13 PM EDT ----- Labs ok--thyroid overtreated--decrease synthroid from 200 to 156mcg daily.

## 2015-09-01 NOTE — Telephone Encounter (Signed)
Pt advised and RX sent in-aa 

## 2015-11-06 DIAGNOSIS — H2513 Age-related nuclear cataract, bilateral: Secondary | ICD-10-CM | POA: Diagnosis not present

## 2015-11-11 DIAGNOSIS — H04123 Dry eye syndrome of bilateral lacrimal glands: Secondary | ICD-10-CM | POA: Diagnosis not present

## 2015-11-11 DIAGNOSIS — H40003 Preglaucoma, unspecified, bilateral: Secondary | ICD-10-CM | POA: Diagnosis not present

## 2015-11-11 DIAGNOSIS — H25813 Combined forms of age-related cataract, bilateral: Secondary | ICD-10-CM | POA: Diagnosis not present

## 2015-11-14 DIAGNOSIS — E039 Hypothyroidism, unspecified: Secondary | ICD-10-CM | POA: Diagnosis not present

## 2016-01-09 ENCOUNTER — Ambulatory Visit (INDEPENDENT_AMBULATORY_CARE_PROVIDER_SITE_OTHER): Payer: Medicare Other

## 2016-01-09 DIAGNOSIS — Z23 Encounter for immunization: Secondary | ICD-10-CM | POA: Diagnosis not present

## 2016-02-11 ENCOUNTER — Ambulatory Visit (INDEPENDENT_AMBULATORY_CARE_PROVIDER_SITE_OTHER): Payer: Medicare Other | Admitting: Family Medicine

## 2016-02-11 ENCOUNTER — Ambulatory Visit
Admission: RE | Admit: 2016-02-11 | Discharge: 2016-02-11 | Disposition: A | Discharge status: Home / Self Care | Payer: Medicare Other | Source: Ambulatory Visit | Attending: Family Medicine | Admitting: Family Medicine

## 2016-02-11 ENCOUNTER — Encounter: Payer: Self-pay | Admitting: Family Medicine

## 2016-02-11 VITALS — BP 124/60 | HR 80 | Temp 97.9°F | Resp 16 | Wt 125.0 lb

## 2016-02-11 DIAGNOSIS — M5440 Lumbago with sciatica, unspecified side: Secondary | ICD-10-CM | POA: Diagnosis not present

## 2016-02-11 DIAGNOSIS — K6289 Other specified diseases of anus and rectum: Secondary | ICD-10-CM

## 2016-02-11 DIAGNOSIS — M545 Low back pain: Secondary | ICD-10-CM | POA: Diagnosis not present

## 2016-02-11 DIAGNOSIS — M4316 Spondylolisthesis, lumbar region: Secondary | ICD-10-CM | POA: Diagnosis not present

## 2016-02-11 MED ORDER — MELOXICAM 7.5 MG PO TABS: 7.5000 mg | ORAL_TABLET | Freq: Every day | ORAL | 0 refills | Status: DC

## 2016-02-11 MED ORDER — POLYETHYLENE GLYCOL 3350 17 GM/SCOOP PO POWD: 17.0000 g | Freq: Two times a day (BID) | ORAL | 1 refills | Status: DC | PRN

## 2016-02-11 NOTE — Progress Notes (Signed)
Subjective:  HPI Pt is here today for rectal pain. She states " It hurts on the inside of my bottom". She has had problems with her GI tract for a while and has had colonoscopies that have shown internal hemorrhoids and diverticula polyp. Pt reports that her stools do not look normal and something she has diarrhea. She is companied by her husband today, which made the appt and they both say that this problem is limiting what they do and where and when they go because she has to stay in the bathroom for 20-30 minutes if she has to go and does not want to do that in public. She reports that the stool is brown color and is "jagged" and sometimes when she has a bowel movement she has blood (bright red) on the toilet paper where she has skin tares from having a bowel movement.    Prior to Admission medications   Medication Sig Start Date End Date Taking? Authorizing Provider  estradiol (ESTRACE) 0.5 MG tablet Take 0.5 mg by mouth daily.    Historical Provider, MD  levothyroxine (SYNTHROID, LEVOTHROID) 175 MCG tablet Take 1 tablet (175 mcg total) by mouth daily before breakfast. 09/01/15   Jerrol Banana., MD  metoprolol (LOPRESSOR) 50 MG tablet TAKE ONE TABLET BY MOUTH EVERY DAY 01/07/15   Jerrol Banana., MD  omeprazole (PRILOSEC) 40 MG capsule TAKE ONE CAPSULE BY MOUTH EVERY DAY 30 MINUTES BEFORE BREAKFAST 06/08/15   Euan Wandler Maceo Pro., MD  potassium chloride (K-DUR,KLOR-CON) 10 MEQ tablet TAKE ONE TABLET BY MOUTH EVERY DAY 05/22/15   Jerrol Banana., MD    Patient Active Problem List   Diagnosis Date Noted  . Anxiety 07/12/2014  . Clinical depression 07/12/2014  . Acid reflux 07/12/2014  . Autoimmune lymphocytic chronic thyroiditis 07/12/2014  . HLD (hyperlipidemia) 07/12/2014  . BP (high blood pressure) 07/12/2014  . Adult hypothyroidism 07/12/2014  . Mild major depression ([place]) 07/12/2014  . Arthritis, degenerative 07/12/2014  . Post menopausal syndrome 07/12/2014  .  CMC arthritis, thumb, degenerative 06/25/2014    Past Medical History:  Diagnosis Date  . Depression   . GERD (gastroesophageal reflux disease)   . Hypertension   . Hypothyroidism     Social History   Social History  . Marital status: Married    Spouse name: N/A  . Number of children: N/A  . Years of education: N/A   Occupational History  . Not on file.   Social History Main Topics  . Smoking status: Never Smoker  . Smokeless tobacco: Never Used  . Alcohol use No  . Drug use: No  . Sexual activity: Not on file   Other Topics Concern  . Not on file   Social History Narrative  . No narrative on file    No Known Allergies  Review of Systems  Constitutional: Negative.   HENT: Negative.   Eyes: Negative.   Respiratory: Negative.   Cardiovascular: Negative.   Gastrointestinal: Positive for abdominal pain, blood in stool (on toliet paper when wiping) and diarrhea.  Genitourinary: Negative.   Musculoskeletal: Negative.   Skin: Negative.   Neurological: Negative.   Endo/Heme/Allergies: Negative.   Psychiatric/Behavioral: Negative.     Immunization History  Administered Date(s) Administered  . Influenza, High Dose Seasonal PF 01/04/2015, 01/09/2016  . Pneumococcal Conjugate-13 06/26/2015  . Tdap 10/17/2006    Objective:  BP 124/60 (BP Location: Left Arm, Patient Position: Sitting, Cuff Size: Normal)   Pulse  80   Temp 97.9 F (36.6 C) (Oral)   Resp 16   Wt 125 lb (56.7 kg)   LMP  (LMP Unknown)   BMI 24.41 kg/m   Physical Exam  Constitutional: She is oriented to person, place, and time and well-developed, well-nourished, and in no distress.  HENT:  Head: Normocephalic and atraumatic.  Right Ear: External ear normal.  Left Ear: External ear normal.  Nose: Nose normal.  Eyes: Conjunctivae are normal. No scleral icterus.  Neck: No thyromegaly present.  Cardiovascular: Normal rate, regular rhythm and normal heart sounds.   Pulmonary/Chest: Effort normal  and breath sounds normal.  Abdominal: Soft.  Genitourinary:  Genitourinary Comments: Perianal exam reveals hemorrhoidal tags with no obvious thrombosed hemorrhoid. The posterior tag appears to have a rough surface.  Neurological: She is alert and oriented to person, place, and time.  Skin: Skin is warm and dry.  Psychiatric: Mood, memory, affect and judgment normal.    Lab Results  Component Value Date   WBC 6.0 08/29/2015   HGB 12.6 06/18/2014   HCT 39.1 08/29/2015   PLT 203 08/29/2015   GLUCOSE 96 08/29/2015   CHOL 246 (H) 08/29/2015   TRIG 172 (H) 08/29/2015   HDL 63 08/29/2015   LDLCALC 149 (H) 08/29/2015   TSH 0.185 (L) 08/29/2015    CMP     Component Value Date/Time   NA 142 08/29/2015 0848   K 4.9 08/29/2015 0848   CL 101 08/29/2015 0848   CO2 24 08/29/2015 0848   GLUCOSE 96 08/29/2015 0848   BUN 16 08/29/2015 0848   CREATININE 0.85 08/29/2015 0848   CALCIUM 9.0 08/29/2015 0848   PROT 6.9 08/29/2015 0848   ALBUMIN 4.2 08/29/2015 0848   AST 13 08/29/2015 0848   ALT 11 08/29/2015 0848   ALKPHOS 84 08/29/2015 0848   BILITOT 0.3 08/29/2015 0848   GFRNONAA 70 08/29/2015 0848   GFRAA 80 08/29/2015 0848    Assessment and Plan :  1. Anal or rectal pain  Perianal pain with uncertain etiology today. She does have some hemorrhoidal tags in the appearance of the mucosal is slightly off in my opinion. Low probability but I'm concerned about the possibility that this be an squamous cell carcinoma of the anus. Defer to her expertise of general surgery. IBS does not fit with this pain but consider Levsin or Linzess if surgical evaluation does not find her any relief. As noted, she did have normal colonoscopy earlier this year. small polyp and internal hemorrhoids found. Polyp was a tubular adenoma. - Ambulatory referral to General Surgery  2. Low back pain with sciatica, sciatica laterality unspecified, unspecified back pain laterality, unspecified chronicity It is possible  that back problem could be the etiology of this pain but I do not think so. She is a little tender in the region of the LS spine and also the paraspinal muscles out to the SI joints. - DG Lumbar Spine Complete; Future - meloxicam (MOBIC) 7.5 MG tablet; Take 1 tablet (7.5 mg total) by mouth daily.  Dispense: 30 tablet; Refill: 0 I will see the patient back in 2-3 weeks after she is sitting surgery. I have done the exam and reviewed the above chart and it is accurate to the best of my knowledge. Development worker, community has been used in this note in any air is in the dictation or transcription are unintentional.  [facility] 02/11/2016 3:08 PM

## 2016-02-13 ENCOUNTER — Encounter: Payer: Self-pay | Admitting: General Surgery

## 2016-03-01 ENCOUNTER — Ambulatory Visit: Payer: Medicare Other | Admitting: General Surgery

## 2016-03-08 ENCOUNTER — Ambulatory Visit: Payer: Medicare Other | Admitting: Family Medicine

## 2016-03-09 ENCOUNTER — Ambulatory Visit (INDEPENDENT_AMBULATORY_CARE_PROVIDER_SITE_OTHER): Payer: Medicare Other | Admitting: Family Medicine

## 2016-03-09 ENCOUNTER — Ambulatory Visit: Payer: Medicare Other | Admitting: General Surgery

## 2016-03-09 VITALS — BP 130/68 | HR 72 | Temp 97.3°F | Resp 16 | Wt 129.0 lb

## 2016-03-09 DIAGNOSIS — K52832 Lymphocytic colitis: Secondary | ICD-10-CM

## 2016-03-09 MED ORDER — BUDESONIDE 9 MG PO TB24: 1.0000 | ORAL_TABLET | Freq: Every day | ORAL | 0 refills | Status: DC

## 2016-03-09 NOTE — Progress Notes (Signed)
Paige Baldwin  MRN: [id number] DOB: 08/12/1945  Subjective:  HPI  The patient is a 71 year old female who presents for follow up of anal pain and colitis.  She states she continues with bothersome diarrhea and anal pain.  She states that it is to the point she doesn't feel comfortable going out because she does not know when it will hit her.  She also reports seeing blood with her bowel movement.  She state her rectum stays sore also and thinks that the bleeding is coming from the rectum.  She has not seen GI for this problem.  Patient Active Problem List   Diagnosis Date Noted  . Anxiety 07/12/2014  . Clinical depression 07/12/2014  . Acid reflux 07/12/2014  . Autoimmune lymphocytic chronic thyroiditis 07/12/2014  . HLD (hyperlipidemia) 07/12/2014  . BP (high blood pressure) 07/12/2014  . Adult hypothyroidism 07/12/2014  . Mild major depression ([place]) 07/12/2014  . Arthritis, degenerative 07/12/2014  . Post menopausal syndrome 07/12/2014  . CMC arthritis, thumb, degenerative 06/25/2014    Past Medical History:  Diagnosis Date  . Depression   . GERD (gastroesophageal reflux disease)   . Hypertension   . Hypothyroidism     Social History   Social History  . Marital status: Married    Spouse name: N/A  . Number of children: N/A  . Years of education: N/A   Occupational History  . Not on file.   Social History Main Topics  . Smoking status: Never Smoker  . Smokeless tobacco: Never Used  . Alcohol use No  . Drug use: No  . Sexual activity: Not on file   Other Topics Concern  . Not on file   Social History Narrative  . No narrative on file    Outpatient Encounter Prescriptions as of 03/09/2016  Medication Sig Note  . COLOSTRUM PO Take by mouth. 02/11/2016: Received from: [place]: Take by mouth.  . Digestive Enzymes (PAPAYA ENZYME PO) Take by mouth. 02/11/2016: Received from: [place]: Take by  mouth.  . estradiol (ESTRACE) 0.5 MG tablet Take 0.5 mg by mouth daily.   Marland Kitchen levothyroxine (SYNTHROID, LEVOTHROID) 175 MCG tablet Take 1 tablet (175 mcg total) by mouth daily before breakfast.   . MAGNESIUM MALATE PO Use. 02/11/2016: Received from: [facility]  . meloxicam (MOBIC) 7.5 MG tablet Take 1 tablet (7.5 mg total) by mouth daily.   . Menaquinone-7 (VITAMIN K2 PO) Take by mouth. 02/11/2016: Received from: [place]: Take by mouth.  . metoprolol (LOPRESSOR) 50 MG tablet TAKE ONE TABLET BY MOUTH EVERY DAY   . omeprazole (PRILOSEC) 40 MG capsule TAKE ONE CAPSULE BY MOUTH EVERY DAY 30 MINUTES BEFORE BREAKFAST   . potassium chloride (K-DUR,KLOR-CON) 10 MEQ tablet TAKE ONE TABLET BY MOUTH EVERY DAY   . [DISCONTINUED] polyethylene glycol powder (GLYCOLAX/MIRALAX) powder Take 17 g by mouth 2 (two) times daily as needed.    No facility-administered encounter medications on file as of 03/09/2016.     No Known Allergies  Review of Systems  Constitutional: Negative for fever and malaise/fatigue.  Respiratory: Negative for cough, shortness of breath and wheezing.   Cardiovascular: Negative for chest pain, palpitations, orthopnea, claudication, leg swelling and PND.  Gastrointestinal: Positive for blood in stool and diarrhea. Negative for abdominal pain, constipation, heartburn, melena, nausea and vomiting.  Neurological: Negative for dizziness, weakness and headaches.    Objective:  BP 130/68 (  BP Location: Right Arm, Patient Position: Sitting, Cuff Size: Normal)   Pulse 72   Temp 97.3 F (36.3 C) (Oral)   Resp 16   Wt 129 lb (58.5 kg)   LMP  (LMP Unknown)   BMI 25.19 kg/m   Physical Exam  Constitutional: She is oriented to person, place, and time and well-developed, well-nourished, and in no distress.  HENT:  Head: Normocephalic and atraumatic.  Eyes: Conjunctivae are normal. Pupils are equal, round, and reactive to light.  Neck:  Normal range of motion.  Cardiovascular: Normal rate, regular rhythm and normal heart sounds.   Pulmonary/Chest: Effort normal and breath sounds normal.  Abdominal: Soft. Bowel sounds are normal. There is tenderness (Across the pelvis).  No guarding or rebound.  Neurological: She is alert and oriented to person, place, and time.  Skin: Skin is warm and dry.  Psychiatric: Mood, memory, affect and judgment normal.    Assessment and Plan :   1. Lymphocytic colitis This diagnosis fits but it is been bothering the patient for several months. Refer back to GI. - Ambulatory referral to Gastroenterology - Budesonide 9 MG TB24; Take 1 tablet by mouth daily.  Dispense: 30 tablet; Refill: 0  I have done the exam and reviewed the chart and it is accurate to the best of my knowledge. Development worker, community has been used and  any errors in dictation or transcription are unintentional. Miguel Aschoff M.D. [facility]   HPI, Exam and A&P Transcribed under the direction and in the presence of [name]., MD. Electronically Signed: Althea Charon, [place]

## 2016-03-15 ENCOUNTER — Telehealth: Payer: Self-pay | Admitting: General Surgery

## 2016-03-15 NOTE — Telephone Encounter (Signed)
PATIENTS SPOUSE(JAMES Harvie)CAME IN & WANTED TO KNOW WHY SHE WAS COMING TO SEE DR BYRNETT.AFTER CHECKING THE DPR I WAS ABLE TO SHARE WITH HIM IT WAS FOR RECTAL PAIN.HE WANTED TO KNOW WHAT THE X-RAY SHE HAD DONE SHOWED.HE WAS REFERRED TO DR Marlan Palau [facility]

## 2016-03-17 ENCOUNTER — Ambulatory Visit: Payer: Medicare Other | Admitting: General Surgery

## 2016-03-24 ENCOUNTER — Other Ambulatory Visit: Payer: Self-pay | Admitting: Family Medicine

## 2016-03-24 NOTE — Telephone Encounter (Signed)
Last TSH check was in July 2017, dose was adjusted but we have not re checked levels since then-aaa

## 2016-03-30 ENCOUNTER — Ambulatory Visit: Payer: Medicare Other | Admitting: Gastroenterology

## 2016-04-19 ENCOUNTER — Other Ambulatory Visit: Payer: Self-pay | Admitting: Family Medicine

## 2016-04-20 ENCOUNTER — Encounter: Payer: Self-pay | Admitting: Family Medicine

## 2016-04-20 ENCOUNTER — Ambulatory Visit (INDEPENDENT_AMBULATORY_CARE_PROVIDER_SITE_OTHER): Payer: Medicare Other | Admitting: Family Medicine

## 2016-04-20 VITALS — BP 140/68 | HR 76 | Temp 97.6°F | Resp 16 | Wt 127.0 lb

## 2016-04-20 DIAGNOSIS — K52832 Lymphocytic colitis: Secondary | ICD-10-CM | POA: Diagnosis not present

## 2016-04-20 DIAGNOSIS — F419 Anxiety disorder, unspecified: Secondary | ICD-10-CM

## 2016-04-20 DIAGNOSIS — K6289 Other specified diseases of anus and rectum: Secondary | ICD-10-CM

## 2016-04-20 DIAGNOSIS — I10 Essential (primary) hypertension: Secondary | ICD-10-CM

## 2016-04-20 DIAGNOSIS — K648 Other hemorrhoids: Secondary | ICD-10-CM | POA: Diagnosis not present

## 2016-04-20 DIAGNOSIS — E039 Hypothyroidism, unspecified: Secondary | ICD-10-CM | POA: Diagnosis not present

## 2016-04-20 MED ORDER — HYDROCORTISONE ACETATE 25 MG RE SUPP
25.0000 mg | Freq: Two times a day (BID) | RECTAL | 1 refills | Status: DC
Start: 2016-04-20 — End: 2016-05-18

## 2016-04-20 MED ORDER — SERTRALINE HCL 50 MG PO TABS: 50.0000 mg | ORAL_TABLET | Freq: Every day | ORAL | 11 refills | Status: DC

## 2016-04-20 NOTE — Progress Notes (Signed)
Subjective:  HPI Pt is here for a follow up of her thyroid. She had labs done on 08/29/15 and her thyroid was over treated and Levothyroxine was decreased to 175 mcg daily. She is due to have this checked.   She is still having rectal pain, she was referred to GI but had to cancel her appt. Pt would like to reschedule this referral.   Prior to Admission medications   Medication Sig Start Date End Date Taking? Authorizing Provider  Budesonide 9 MG TB24 Take 1 tablet by mouth daily. 03/09/16   Maura Braaten Maceo Pro., MD  COLOSTRUM PO Take by mouth.    Historical Provider, MD  Digestive Enzymes (PAPAYA ENZYME PO) Take by mouth.    Historical Provider, MD  estradiol (ESTRACE) 0.5 MG tablet Take 0.5 mg by mouth daily.    Historical Provider, MD  levothyroxine (SYNTHROID, LEVOTHROID) 150 MCG tablet TAKE 1 TABLET BY MOUTH ONCE A DAY. 03/25/16   Jerrol Banana., MD  levothyroxine (SYNTHROID, LEVOTHROID) 175 MCG tablet Take 1 tablet (175 mcg total) by mouth daily before breakfast. 09/01/15   Jerrol Banana., MD  MAGNESIUM MALATE PO Use.    Historical Provider, MD  meloxicam (MOBIC) 7.5 MG tablet Take 1 tablet (7.5 mg total) by mouth daily. 02/11/16   Navraj Dreibelbis Maceo Pro., MD  Menaquinone-7 (VITAMIN K2 PO) Take by mouth.    Historical Provider, MD  metoprolol (LOPRESSOR) 50 MG tablet TAKE ONE TABLET BY MOUTH EVERY DAY 04/19/16   Jerrol Banana., MD  omeprazole (PRILOSEC) 40 MG capsule TAKE ONE CAPSULE BY MOUTH EVERY DAY 30 MINUTES BEFORE BREAKFAST 06/08/15   Shamaria Kavan Maceo Pro., MD  potassium chloride (K-DUR,KLOR-CON) 10 MEQ tablet TAKE ONE TABLET BY MOUTH EVERY DAY 05/22/15   Jerrol Banana., MD    Patient Active Problem List   Diagnosis Date Noted  . Anxiety 07/12/2014  . Clinical depression 07/12/2014  . Acid reflux 07/12/2014  . Autoimmune lymphocytic chronic thyroiditis 07/12/2014  . HLD (hyperlipidemia) 07/12/2014  . BP (high blood pressure) 07/12/2014  . Adult  hypothyroidism 07/12/2014  . Mild major depression ([place]) 07/12/2014  . Arthritis, degenerative 07/12/2014  . Post menopausal syndrome 07/12/2014  . CMC arthritis, thumb, degenerative 06/25/2014    Past Medical History:  Diagnosis Date  . Depression   . GERD (gastroesophageal reflux disease)   . Hypertension   . Hypothyroidism     Social History   Social History  . Marital status: Married    Spouse name: N/A  . Number of children: N/A  . Years of education: N/A   Occupational History  . Not on file.   Social History Main Topics  . Smoking status: Never Smoker  . Smokeless tobacco: Never Used  . Alcohol use No  . Drug use: No  . Sexual activity: Not on file   Other Topics Concern  . Not on file   Social History Narrative  . No narrative on file    No Known Allergies  Review of Systems  Constitutional: Negative.   HENT: Negative.   Eyes: Negative.   Respiratory: Negative.   Gastrointestinal:       Rectal pain  Genitourinary: Negative.   Musculoskeletal: Negative.   Skin: Negative.   Neurological: Negative.   Endo/Heme/Allergies: Negative.   Psychiatric/Behavioral: Negative.     Immunization History  Administered Date(s) Administered  . Influenza, High Dose Seasonal PF 01/04/2015, 01/09/2016  . Pneumococcal Conjugate-13 06/26/2015  .  Tdap 10/17/2006    Objective:  BP 140/68 (BP Location: Left Arm, Patient Position: Sitting, Cuff Size: Normal)   Pulse 76   Temp 97.6 F (36.4 C) (Oral)   Resp 16   Wt 127 lb (57.6 kg)   LMP  (LMP Unknown)   BMI 24.80 kg/m   Physical Exam  Constitutional: She is oriented to person, place, and time and well-developed, well-nourished, and in no distress.  HENT:  Head: Normocephalic and atraumatic.  Right Ear: External ear normal.  Left Ear: External ear normal.  Nose: Nose normal.  Eyes: Conjunctivae and EOM are normal. Pupils are equal, round, and reactive to light.  Neck: Normal range of motion. Neck supple.  No thyromegaly present.  Cardiovascular: Normal rate, regular rhythm, normal heart sounds and intact distal pulses.   Pulmonary/Chest: Effort normal and breath sounds normal.  Abdominal: Soft.  Musculoskeletal: Normal range of motion.  Neurological: She is alert and oriented to person, place, and time. Gait normal. GCS score is 15.  Skin: Skin is warm and dry.  Psychiatric: Mood, memory, affect and judgment normal.    Lab Results  Component Value Date   WBC 6.0 08/29/2015   HGB 12.6 06/18/2014   HCT 39.1 08/29/2015   PLT 203 08/29/2015   GLUCOSE 96 08/29/2015   CHOL 246 (H) 08/29/2015   TRIG 172 (H) 08/29/2015   HDL 63 08/29/2015   LDLCALC 149 (H) 08/29/2015   TSH 0.185 (L) 08/29/2015    CMP     Component Value Date/Time   NA 142 08/29/2015 0848   K 4.9 08/29/2015 0848   CL 101 08/29/2015 0848   CO2 24 08/29/2015 0848   GLUCOSE 96 08/29/2015 0848   BUN 16 08/29/2015 0848   CREATININE 0.85 08/29/2015 0848   CALCIUM 9.0 08/29/2015 0848   PROT 6.9 08/29/2015 0848   ALBUMIN 4.2 08/29/2015 0848   AST 13 08/29/2015 0848   ALT 11 08/29/2015 0848   ALKPHOS 84 08/29/2015 0848   BILITOT 0.3 08/29/2015 0848   GFRNONAA 70 08/29/2015 0848   GFRAA 80 08/29/2015 0848    Assessment and Plan :  1. Adult hypothyroidism  - TSH  2. Lymphocytic colitis  - Ambulatory referral to Gastroenterology  3. Anal or rectal pain Ongoing. - Ambulatory referral to Gastroenterology  4. Anxiety  - sertraline (ZOLOFT) 50 MG tablet; Take 1 tablet (50 mg total) by mouth daily.  Dispense: 30 tablet; Refill: 11  5. Internal hemorrhoids  - hydrocortisone (ANUSOL-HC) 25 MG suppository; Place 1 suppository (25 mg total) rectally 2 (two) times daily.  Dispense: 12 suppository; Refill: 1  6. Essential hypertension  7.MCI  I have done the exam and reviewed the above chart and it is accurate to the best of my knowledge. Development worker, community has been used in this note in any air is in the  dictation or transcription are unintentional.  [facility] 04/20/2016 8:28 AM

## 2016-04-21 ENCOUNTER — Ambulatory Visit: Payer: Medicare Other | Admitting: Family Medicine

## 2016-04-21 LAB — TSH: TSH: <0.006 u[IU]/mL — ABNORMAL LOW (ref 0.450–4.500)

## 2016-04-24 ENCOUNTER — Ambulatory Visit: Payer: Self-pay | Admitting: Family Medicine

## 2016-04-27 ENCOUNTER — Other Ambulatory Visit: Payer: Self-pay | Admitting: Emergency Medicine

## 2016-04-27 MED ORDER — LEVOTHYROXINE SODIUM 125 MCG PO TABS: 125.0000 ug | ORAL_TABLET | Freq: Every day | ORAL | 3 refills | Status: DC

## 2016-05-18 ENCOUNTER — Ambulatory Visit (INDEPENDENT_AMBULATORY_CARE_PROVIDER_SITE_OTHER): Payer: Medicare Other | Admitting: Family Medicine

## 2016-05-18 VITALS — BP 150/70 | HR 62 | Temp 98.0°F | Resp 14 | Wt 129.0 lb

## 2016-05-18 DIAGNOSIS — E039 Hypothyroidism, unspecified: Secondary | ICD-10-CM | POA: Diagnosis not present

## 2016-05-18 DIAGNOSIS — I10 Essential (primary) hypertension: Secondary | ICD-10-CM | POA: Diagnosis not present

## 2016-05-18 DIAGNOSIS — F419 Anxiety disorder, unspecified: Secondary | ICD-10-CM | POA: Diagnosis not present

## 2016-05-18 NOTE — Progress Notes (Signed)
Paige Baldwin  MRN: [id number] DOB: 04-02-1945  Subjective:  HPI  Patient is here for follow up. LOV was 04/20/16. TSH level was abnormal and decreased dose to 160mcg. Lab Results  Component Value Date   TSH <0.006 (L) 04/20/2016   \ Patient Active Problem List   Diagnosis Date Noted  . Anxiety 07/12/2014  . Clinical depression 07/12/2014  . Acid reflux 07/12/2014  . Autoimmune lymphocytic chronic thyroiditis 07/12/2014  . HLD (hyperlipidemia) 07/12/2014  . BP (high blood pressure) 07/12/2014  . Adult hypothyroidism 07/12/2014  . Mild major depression ([place]) 07/12/2014  . Arthritis, degenerative 07/12/2014  . Post menopausal syndrome 07/12/2014  . CMC arthritis, thumb, degenerative 06/25/2014    Past Medical History:  Diagnosis Date  . Depression   . GERD (gastroesophageal reflux disease)   . Hypertension   . Hypothyroidism     Social History   Social History  . Marital status: Married    Spouse name: N/A  . Number of children: N/A  . Years of education: N/A   Occupational History  . Not on file.   Social History Main Topics  . Smoking status: Never Smoker  . Smokeless tobacco: Never Used  . Alcohol use No  . Drug use: No  . Sexual activity: Not on file   Other Topics Concern  . Not on file   Social History Narrative  . No narrative on file    Outpatient Encounter Prescriptions as of 05/18/2016  Medication Sig Note  . COLOSTRUM PO Take by mouth. 02/11/2016: Received from: [place]: Take by mouth.  . Digestive Enzymes (PAPAYA ENZYME PO) Take by mouth. 02/11/2016: Received from: [place]: Take by mouth.  . levothyroxine (SYNTHROID, LEVOTHROID) 125 MCG tablet Take 1 tablet (125 mcg total) by mouth daily before breakfast.   . MAGNESIUM MALATE PO Use. 02/11/2016: Received from: [facility]  . Menaquinone-7 (VITAMIN K2 PO) Take by mouth. 02/11/2016: Received from: [place]: Take by mouth.  . metoprolol (LOPRESSOR) 50 MG tablet TAKE ONE TABLET BY MOUTH EVERY DAY   . potassium chloride (K-DUR,KLOR-CON) 10 MEQ tablet TAKE ONE TABLET BY MOUTH EVERY DAY   . sertraline (ZOLOFT) 50 MG tablet Take 1 tablet (50 mg total) by mouth daily.   . [DISCONTINUED] Budesonide 9 MG TB24 Take 1 tablet by mouth daily.   . [DISCONTINUED] estradiol (ESTRACE) 0.5 MG tablet Take 0.5 mg by mouth daily.   . [DISCONTINUED] hydrocortisone (ANUSOL-HC) 25 MG suppository Place 1 suppository (25 mg total) rectally 2 (two) times daily.   . [DISCONTINUED] meloxicam (MOBIC) 7.5 MG tablet Take 1 tablet (7.5 mg total) by mouth daily.   . [DISCONTINUED] omeprazole (PRILOSEC) 40 MG capsule TAKE ONE CAPSULE BY MOUTH EVERY DAY 30 MINUTES BEFORE BREAKFAST    No facility-administered encounter medications on file as of 05/18/2016.     No Known Allergies  Review of Systems  Constitutional: Negative.   Eyes: Negative.   Respiratory: Negative.   Cardiovascular: Negative.   Gastrointestinal: Negative.   Genitourinary: Negative.        Rectal pain  Skin: Negative.   Endo/Heme/Allergies: Negative.   Psychiatric/Behavioral: Negative.     Objective:  BP (!) 150/70 (BP Location: Left Arm, Cuff Size: Normal)   Pulse 62   Temp 98 F (36.7 C)   Resp 14   Wt 129 lb (58.5 kg)   LMP  (LMP Unknown)   BMI  25.19 kg/m   Physical Exam  Constitutional: She is oriented to person, place, and time and well-developed, well-nourished, and in no distress.  HENT:  Head: Normocephalic and atraumatic.  Eyes: Conjunctivae are normal. Pupils are equal, round, and reactive to light.  Neck: Normal range of motion. Neck supple.  Cardiovascular: Normal rate, regular rhythm, normal heart sounds and intact distal pulses.   No murmur heard. Pulmonary/Chest: Effort normal and breath sounds normal. No respiratory distress. She has no wheezes.  Abdominal: Soft. She exhibits no  distension. There is no tenderness.  Musculoskeletal: She exhibits no edema or tenderness.  Neurological: She is alert and oriented to person, place, and time.  Psychiatric: Mood and affect normal.   Assessment and Plan :  1. Adult hypothyroidism Re check level and other labs on the next visit. 2. Anxiety Stable.  3. Essential hypertension elevated today. Will follow and re check on the next visit. Advised patient to check her b/p once a week, also advised patient to do some regular walking.  HPI, Exam and A&P transcribed under direction and in the presence of Miguel Aschoff, MD. I have done the exam and reviewed the chart and it is accurate to the best of my knowledge. Development worker, community has been used and  any errors in dictation or transcription are unintentional. Miguel Aschoff M.D. [facility]

## 2016-05-19 ENCOUNTER — Ambulatory Visit (INDEPENDENT_AMBULATORY_CARE_PROVIDER_SITE_OTHER): Payer: Medicare Other | Admitting: Gastroenterology

## 2016-05-19 ENCOUNTER — Encounter: Payer: Self-pay | Admitting: Gastroenterology

## 2016-05-19 ENCOUNTER — Ambulatory Visit: Payer: No Typology Code available for payment source | Admitting: Gastroenterology

## 2016-05-19 VITALS — BP 160/81 | HR 67 | Temp 97.9°F | Wt 129.8 lb

## 2016-05-19 DIAGNOSIS — R194 Change in bowel habit: Secondary | ICD-10-CM | POA: Diagnosis not present

## 2016-05-19 DIAGNOSIS — K6289 Other specified diseases of anus and rectum: Secondary | ICD-10-CM

## 2016-05-19 MED ORDER — HYDROCORTISONE ACETATE 25 MG RE SUPP: 25.0000 mg | Freq: Every day | RECTAL | 0 refills | Status: AC

## 2016-05-19 MED ORDER — PSYLLIUM 58.6 % PO POWD: 1.0000 | Freq: Three times a day (TID) | ORAL | 12 refills | Status: AC

## 2016-05-19 NOTE — Progress Notes (Signed)
Gastroenterology Consultation  Referring Provider:     Jerrol Banana.,* Primary Care Physician:  [name], MD Primary Gastroenterologist:  Dr. Jonathon Bellows  Reason for Consultation:     Lymphocytic colitis         HPI:   Paige Baldwin is a 71 y.o. y/o female referred for consultation & management  by Dr. [name], MD.    I reviewed her old records - random colon biopsies in 2014 showed lymphocytic colitis. In 06/2015 she had a colonoscopy with Dr Tiffany Kocher and was found to have a tubular adenoma but no random colon biopsies were taken/. She has had a cholecystectomy in the past .   She says that the was prescribed a drug which was very expensive in 2014 after her colonoscopy for her diarrhea, She didn't take the medication due to the cost , and the diarrhea has been on and off.    Presently she says that when she has some pain when she has bowel bowel movement it is associated with pain at the anal area. , "like a fist ". She has 1-2 bowel movements a day, some days has none  , consistency of ripe bananas. Very difficult historian , not consistent with her responses , family too had a hard to obtaining responses to my questions from her.    Past Medical History:  Diagnosis Date  . Depression   . GERD (gastroesophageal reflux disease)   . Hypertension   . Hypothyroidism     Past Surgical History:  Procedure Laterality Date  . ABDOMINAL HYSTERECTOMY    . APPENDECTOMY     during cholecystectomy  . BACK SURGERY    . CHOLECYSTECTOMY     removed appendix at this time  . COLONOSCOPY WITH PROPOFOL N/A 06/30/2015   Procedure: COLONOSCOPY WITH PROPOFOL;  Surgeon: Manya Silvas, MD;  Location: [facility] ENDOSCOPY;  Service: Endoscopy;  Laterality: N/A;tubular adenoma repeat 06/2020  . FINGER ARTHROPLASTY Right 06/25/2014   Procedure: FINGER ARTHROPLASTY;  Surgeon: Christophe Louis, MD;  Location: [facility] ORS;  Service: Orthopedics;  Laterality: Right;  .  SALPINGOOPHORECTOMY      Prior to Admission medications   Medication Sig Start Date End Date Taking? Authorizing Provider  COLOSTRUM PO Take by mouth.   Yes Historical Provider, MD  Digestive Enzymes (PAPAYA ENZYME PO) Take by mouth.   Yes Historical Provider, MD  levothyroxine (SYNTHROID, LEVOTHROID) 125 MCG tablet Take 1 tablet (125 mcg total) by mouth daily before breakfast. 04/27/16  Yes Jerrol Banana., MD  MAGNESIUM MALATE PO Use.   Yes Historical Provider, MD  Menaquinone-7 (VITAMIN K2 PO) Take by mouth.   Yes Historical Provider, MD  metoprolol (LOPRESSOR) 50 MG tablet TAKE ONE TABLET BY MOUTH EVERY DAY 04/19/16  Yes Richard Maceo Pro., MD  potassium chloride (K-DUR,KLOR-CON) 10 MEQ tablet TAKE ONE TABLET BY MOUTH EVERY DAY 05/22/15  Yes Jerrol Banana., MD  sertraline (ZOLOFT) 50 MG tablet Take 1 tablet (50 mg total) by mouth daily. Patient not taking: Reported on 05/19/2016 04/20/16   Jerrol Banana., MD    Family History  Problem Relation Age of Onset  . Hypertension Father   . Heart disease Father   . Diabetes Father   . Congestive Heart Failure Father   . Stroke Paternal Uncle   . Hypertension Brother   . CAD Brother   . Anemia Brother      Social History  Substance Use Topics  .  Smoking status: Never Smoker  . Smokeless tobacco: Never Used  . Alcohol use No    Allergies as of 05/19/2016  . (No Known Allergies)    Review of Systems:    All systems reviewed and negative except where noted in HPI.   Physical Exam:  BP (!) 160/81 (BP Location: Right Arm, Patient Position: Sitting, Cuff Size: Normal)   Pulse 67   Temp 97.9 F (36.6 C) (Oral)   Wt 129 lb 12.8 oz (58.9 kg)   LMP  (LMP Unknown)   BMI 25.35 kg/m  No LMP recorded (lmp unknown). Patient has had a hysterectomy. Psych:  Alert and cooperative. Normal mood and affect. General:   Alert,  Well-developed, well-nourished, pleasant and cooperative in NAD Head:  Normocephalic and  atraumatic. Eyes:  Sclera clear, no icterus.   Conjunctiva pink. Ears:  Normal auditory acuity. Nose:  No deformity, discharge, or lesions. Mouth:  No deformity or lesions,oropharynx pink & moist. Neck:  Supple; no masses or thyromegaly. Lungs:  Respirations even and unlabored.  Clear throughout to auscultation.   No wheezes, crackles, or rhonchi. No acute distress. Heart:  Regular rate and rhythm; no murmurs, clicks, rubs, or gallops. Abdomen:  Normal bowel sounds.  No bruits.  Soft, non-tender and non-distended without masses, hepatosplenomegaly or hernias noted.  No guarding or rebound tenderness.    Extremities:  No clubbing or edema.  No cyanosis. Neurologic:  Alert and oriented x3;  grossly normal neurologically. Psych:  Alert and cooperative. Normal mood and affect.  Imaging Studies: No results found.  Assessment and Plan:   Paige Baldwin is a 71 y.o. y/o female has been referred for lymphocytic colitis. She has a diagnosis of lymphocytic colitis in 2014 but really never was treated. Presently her history is very vague, she is here today with her son and husband and she kept saying she has 1 bowel movement every day or every other day , she consumes a lot of salads and it may be causing her to have softer than normal stool. She clearly does not have multiple watery bowel movements a day. She also complains of painful defecation suggestive of an anal fissure  .  I suggested today  1. Maintain a food diary for the next two weeks documenting the number of bowel movements , consistency along with food consumed the previous day   2. Fecal lactoferrin  3. Metamucil fiber supplement, anusol and sitz bath for anal fissure  4. If she truly has diarrhea based on her dairy record then may need a flex sig with biopsies   Follow up in 2-3 weeks   Dr Jonathon Bellows MD

## 2016-06-07 ENCOUNTER — Other Ambulatory Visit: Payer: Self-pay | Admitting: Family Medicine

## 2016-06-10 ENCOUNTER — Other Ambulatory Visit: Payer: Self-pay | Admitting: Family Medicine

## 2016-06-15 ENCOUNTER — Ambulatory Visit: Payer: Medicare Other | Admitting: Gastroenterology

## 2016-06-25 ENCOUNTER — Other Ambulatory Visit: Payer: Self-pay | Admitting: Family Medicine

## 2016-06-25 DIAGNOSIS — I10 Essential (primary) hypertension: Secondary | ICD-10-CM

## 2016-06-29 ENCOUNTER — Ambulatory Visit (INDEPENDENT_AMBULATORY_CARE_PROVIDER_SITE_OTHER): Payer: Medicare Other | Admitting: Family Medicine

## 2016-06-29 ENCOUNTER — Ambulatory Visit (INDEPENDENT_AMBULATORY_CARE_PROVIDER_SITE_OTHER): Payer: Medicare Other

## 2016-06-29 VITALS — BP 156/62 | HR 72 | Temp 98.5°F | Ht 60.0 in | Wt 131.0 lb

## 2016-06-29 DIAGNOSIS — W57XXXA Bitten or stung by nonvenomous insect and other nonvenomous arthropods, initial encounter: Secondary | ICD-10-CM | POA: Diagnosis not present

## 2016-06-29 DIAGNOSIS — Z23 Encounter for immunization: Secondary | ICD-10-CM | POA: Diagnosis not present

## 2016-06-29 DIAGNOSIS — Z0000 Encounter for general adult medical examination without abnormal findings: Secondary | ICD-10-CM

## 2016-06-29 DIAGNOSIS — E039 Hypothyroidism, unspecified: Secondary | ICD-10-CM | POA: Diagnosis not present

## 2016-06-29 DIAGNOSIS — I10 Essential (primary) hypertension: Secondary | ICD-10-CM

## 2016-06-29 DIAGNOSIS — Z1159 Encounter for screening for other viral diseases: Secondary | ICD-10-CM | POA: Diagnosis not present

## 2016-06-29 MED ORDER — AMLODIPINE BESYLATE 5 MG PO TABS: 5.0000 mg | ORAL_TABLET | Freq: Every day | ORAL | 3 refills | Status: DC

## 2016-06-29 NOTE — Patient Instructions (Signed)
Apply soap and warm water in the area of the tick bite.

## 2016-06-29 NOTE — Progress Notes (Signed)
Patient: Paige Baldwin Female    DOB: 1945/05/21   71 y.o.   MRN: [id number] Visit Date: 06/29/2016  Today's Provider: [name], MD   Chief Complaint  Patient presents with  . Hypertension  . Hypothyroidism   Subjective:    HPI  Hypertension, follow-up:  BP Readings from Last 3 Encounters:  06/29/16 (!) 156/62  05/19/16 (!) 160/81  05/18/16 (!) 150/70    She was last seen for hypertension 6 weeks ago.  BP at that visit was 150/70. Management since that visit includes no changes. Patient was to check her BP once a week to monitor. She reports good compliance with treatment. She is not having side effects.  She is exercising. She is adherent to low salt diet.   Outside blood pressures are checked occasionally. She is experiencing none.  Patient denies exertional chest pressure/discomfort, lower extremity edema and palpitations.    Weight trend: stable Wt Readings from Last 3 Encounters:  06/29/16 131 lb (59.4 kg)  05/19/16 129 lb 12.8 oz (58.9 kg)  05/18/16 129 lb (58.5 kg)    Current diet: well balanced   Hypothyroidism, follow up: Patient was last seen in the office 6 weeks ago. Since last visit, Synthroid was decreased from 127mcg to 163mcg daily. Patient reports good symptom control.       No Known Allergies   Current Outpatient Prescriptions:  .  COLOSTRUM PO, Take by mouth., Disp: , Rfl:  .  Digestive Enzymes (PAPAYA ENZYME PO), Take by mouth., Disp: , Rfl:  .  levothyroxine (SYNTHROID, LEVOTHROID) 125 MCG tablet, Take 1 tablet (125 mcg total) by mouth daily before breakfast., Disp: 90 tablet, Rfl: 3 .  MAGNESIUM MALATE PO, Use., Disp: , Rfl:  .  Menaquinone-7 (VITAMIN K2 PO), Take by mouth., Disp: , Rfl:  .  metoprolol (LOPRESSOR) 50 MG tablet, TAKE ONE TABLET BY MOUTH EVERY DAY, Disp: 30 tablet, Rfl: 12 .  omeprazole (PRILOSEC) 40 MG capsule, TAKE ONE CAPSULE BY MOUTH EVERY DAY 30 MINUTES BEFORE BREAKFAST (Patient not taking: Reported  on 06/29/2016), Disp: 30 capsule, Rfl: 11 .  potassium chloride (K-DUR,KLOR-CON) 10 MEQ tablet, TAKE ONE TABLET BY MOUTH EVERY DAY, Disp: 30 tablet, Rfl: 12 .  sertraline (ZOLOFT) 50 MG tablet, Take 1 tablet (50 mg total) by mouth daily., Disp: 30 tablet, Rfl: 11  Review of Systems  Constitutional: Negative.   Eyes: Negative.   Respiratory: Negative.   Cardiovascular: Negative.   Endocrine: Negative.   Musculoskeletal: Negative.   Allergic/Immunologic: Negative.   Neurological: Negative.   Hematological: Negative.   Psychiatric/Behavioral: Negative.     Social History  Substance Use Topics  . Smoking status: Never Smoker  . Smokeless tobacco: Never Used  . Alcohol use No   Objective:   BP    156/62 (BP Location: Right Arm)     Pulse  72     Temp  98.5 F (36.9 C) (Oral)     Ht  5' (1.524 m)     Wt  131 lb (59.4 kg)      LMP  (LMP Unknown)   BMI  25.58 kg/m       Physical Exam  Constitutional: She is oriented to person, place, and time. She appears well-developed and well-nourished.  HENT:  Head: Normocephalic and atraumatic.  Right Ear: External ear normal.  Left Ear: External ear normal.  Nose: Nose normal.  Mouth/Throat: Oropharynx is clear and moist.  Cardiovascular: Normal rate and  normal heart sounds.   Pulmonary/Chest: Effort normal and breath sounds normal.  Abdominal: Soft. Bowel sounds are normal.  Musculoskeletal: Normal range of motion. She exhibits no edema.  Neurological: She is alert and oriented to person, place, and time.  Skin: Skin is warm and dry.  Tick bite on left thoracic area  Psychiatric: She has a normal mood and affect. Her behavior is normal. Judgment and thought content normal.        Assessment & Plan:     1. Essential hypertension Not to goal. Add amlodipine as below. Recheck BP in 3-4 months.  - Lipid panel - Comprehensive metabolic panel - amLODipine (NORVASC) 5 MG tablet; Take 1 tablet (5 mg total) by mouth  daily.  Dispense: 90 tablet; Refill: 3  2. Adult hypothyroidism - TSH  3. Tick bite, initial encounter Noninfected.      I have done the exam and reviewed the above chart and it is accurate to the best of my knowledge. Development worker, community has been used in this note in any air is in the dictation or transcription are unintentional.  [name], MD  [place]

## 2016-06-29 NOTE — Progress Notes (Signed)
Subjective:   Paige Baldwin is a 71 y.o. female who presents for Medicare Annual (Subsequent) preventive examination.  Review of Systems:  N/A  Cardiac Risk Factors include: advanced age (>78men, >85 women);dyslipidemia;hypertension     Objective:     Vitals: BP (!) 156/62 (BP Location: Right Arm)   Pulse 72   Temp 98.5 F (36.9 C) (Oral)   Ht 5' (1.524 m)   Wt 131 lb (59.4 kg)   LMP  (LMP Unknown)   BMI 25.58 kg/m   Body mass index is 25.58 kg/m.   Tobacco History  Smoking Status  . Never Smoker  Smokeless Tobacco  . Never Used     Counseling given: Not Answered   Past Medical History:  Diagnosis Date  . Depression   . GERD (gastroesophageal reflux disease)   . Hypertension   . Hypothyroidism    Past Surgical History:  Procedure Laterality Date  . ABDOMINAL HYSTERECTOMY    . APPENDECTOMY     during cholecystectomy  . BACK SURGERY    . CHOLECYSTECTOMY     removed appendix at this time  . COLONOSCOPY WITH PROPOFOL N/A 06/30/2015   Procedure: COLONOSCOPY WITH PROPOFOL;  Surgeon: Manya Silvas, MD;  Location: [facility]COPY;  Service: Endoscopy;  Laterality: N/A;tubular adenoma repeat 06/2020  . FINGER ARTHROPLASTY Right 06/25/2014   Procedure: FINGER ARTHROPLASTY;  Surgeon: Christophe Louis, MD;  Location: [facility] ORS;  Service: Orthopedics;  Laterality: Right;  . SALPINGOOPHORECTOMY     Family History  Problem Relation Age of Onset  . Hypertension Father   . Heart disease Father   . Diabetes Father   . Congestive Heart Failure Father   . Stroke Paternal Uncle   . Hypertension Brother   . CAD Brother   . Anemia Brother    History  Sexual Activity  . Sexual activity: Not on file    Outpatient Encounter Prescriptions as of 06/29/2016  Medication Sig  . COLOSTRUM PO Take by mouth.  . Digestive Enzymes (PAPAYA ENZYME PO) Take by mouth.  . levothyroxine (SYNTHROID, LEVOTHROID) 125 MCG tablet Take 1 tablet (125 mcg total) by mouth daily before  breakfast.  . MAGNESIUM MALATE PO Use.  . Menaquinone-7 (VITAMIN K2 PO) Take by mouth.  . metoprolol (LOPRESSOR) 50 MG tablet TAKE ONE TABLET BY MOUTH EVERY DAY  . potassium chloride (K-DUR,KLOR-CON) 10 MEQ tablet TAKE ONE TABLET BY MOUTH EVERY DAY  . sertraline (ZOLOFT) 50 MG tablet Take 1 tablet (50 mg total) by mouth daily.  Marland Kitchen omeprazole (PRILOSEC) 40 MG capsule TAKE ONE CAPSULE BY MOUTH EVERY DAY 30 MINUTES BEFORE BREAKFAST (Patient not taking: Reported on 06/29/2016)   No facility-administered encounter medications on file as of 06/29/2016.     Activities of Daily Living In your present state of health, do you have any difficulty performing the following activities: 06/29/2016  Hearing? N  Vision? N  Difficulty concentrating or making decisions? N  Walking or climbing stairs? N  Dressing or bathing? N  Doing errands, shopping? N  Preparing Food and eating ? N  Using the Toilet? N  In the past six months, have you accidently leaked urine? N  Do you have problems with loss of bowel control? N  Managing your Medications? N  Managing your Finances? N  Housekeeping or managing your Housekeeping? N  Some recent data might be hidden    Patient Care Team: Jerrol Banana., MD as PCP - General (Family Medicine)    Assessment:  Exercise Activities and Dietary recommendations Current Exercise Habits: The patient does not participate in regular exercise at present (walks occasionally but not weekly), Exercise limited by: None identified  Goals    . Increase water intake          Recommend increasing water intake to 3-4 glasses a day.      Fall Risk Fall Risk  06/29/2016 06/26/2015  Falls in the past year? No No   Depression Screen PHQ 2/9 Scores 06/29/2016 06/29/2016 06/26/2015  PHQ - 2 Score 0 0 0  PHQ- 9 Score 0 - -     Cognitive Function     6CIT Screen 06/29/2016  What Year? 0 points  What month? 0 points  What time? 0 points  Count back from 20 0 points  Months in  reverse 0 points  Repeat phrase 2 points  Total Score 2    Immunization History  Administered Date(s) Administered  . Influenza, High Dose Seasonal PF 01/04/2015, 01/09/2016  . Pneumococcal Conjugate-13 06/26/2015  . Tdap 10/17/2006   Screening Tests Health Maintenance  Topic Date Due  . MAMMOGRAM  05/23/2017 (Originally 03/29/2016)  . INFLUENZA VACCINE  09/22/2016  . TETANUS/TDAP  10/16/2016  . COLONOSCOPY  06/29/2025  . DEXA SCAN  Completed  . Hepatitis C Screening  Completed  . PNA vac Low Risk Adult  Completed      Plan:  I have personally reviewed and addressed the Medicare Annual Wellness questionnaire and have noted the following in the patient's chart:  A. Medical and social history B. Use of alcohol, tobacco or illicit drugs  C. Current medications and supplements D. Functional ability and status E.  Nutritional status F.  Physical activity G. Advance directives H. List of other physicians I.  Hospitalizations, surgeries, and ER visits in previous 12 months J.  [place] such as hearing and vision if needed, cognitive and depression L. Referrals and appointments - none  In addition, I have reviewed and discussed with patient certain preventive protocols, quality metrics, and best practice recommendations. A written personalized care plan for preventive services as well as general preventive health recommendations were provided to patient.  See attached scanned questionnaire for additional information.   Signed,  Fabio Neighbors, LPN Nurse Health Advisor   MD Recommendations: None. Pt to set up mammogram this year. I have reviewed the health advisors note, was  available for consultation and I agree with documentation and plan. Miguel Aschoff MD [facility]

## 2016-06-29 NOTE — Patient Instructions (Signed)
Paige Baldwin , Thank you for taking time to come for your Medicare Wellness Visit. I appreciate your ongoing commitment to your health goals. Please review the following plan we discussed and let me know if I can assist you in the future.   Screening recommendations/referrals: Colonoscopy: completed 06/30/15, due 06/2025 Mammogram: Pt to set up appointment for this year. Bone Density: completed 07/16/15, due 06/2025 Recommended yearly ophthalmology/optometry visit for glaucoma screening and checkup Recommended yearly dental visit for hygiene and checkup  Vaccinations: Influenza vaccine: up to date, due 10/2016 Pneumococcal vaccine: completed series Tdap vaccine: completed 10/17/06, due 09/2016 Shingles vaccine: declined    Advanced directives: Discussed at [facility] today. Pt declined at this time but was advised to call back when ready to set up.  Conditions/risks identified: Recommend increasing water intake to 3-4 glasses a day.  Next appointment: None, need to schedule 1 year AWV.   Preventive Care 55 Years and Older, Female Preventive care refers to lifestyle choices and visits with your health care provider that can promote health and wellness. What does preventive care include?  A yearly physical exam. This is also called an annual well check.  Dental exams once or twice a year.  Routine eye exams. Ask your health care provider how often you should have your eyes checked.  Personal lifestyle choices, including:  Daily care of your teeth and gums.  Regular physical activity.  Eating a healthy diet.  Avoiding tobacco and drug use.  Limiting alcohol use.  Practicing safe sex.  Taking low-dose aspirin every day.  Taking vitamin and mineral supplements as recommended by your health care provider. What happens during an annual well check? The services and screenings done by your health care provider during your annual well check will depend on your age, overall health,  lifestyle risk factors, and family history of disease. Counseling  Your health care provider may ask you questions about your:  Alcohol use.  Tobacco use.  Drug use.  Emotional well-being.  Home and relationship well-being.  Sexual activity.  Eating habits.  History of falls.  Memory and ability to understand (cognition).  Work and work Statistician.  Reproductive health. Screening  You may have the following tests or measurements:  Height, weight, and BMI.  Blood pressure.  Lipid and cholesterol levels. These may be checked every 5 years, or more frequently if you are over 44 years old.  Skin check.  Lung cancer screening. You may have this screening every year starting at age 41 if you have a 30-pack-year history of smoking and currently smoke or have quit within the past 15 years.  Fecal occult blood test (FOBT) of the stool. You may have this test every year starting at age 46.  Flexible sigmoidoscopy or colonoscopy. You may have a sigmoidoscopy every 5 years or a colonoscopy every 10 years starting at age 9.  Hepatitis C blood test.  Hepatitis B blood test.  Sexually transmitted disease (STD) testing.  Diabetes screening. This is done by checking your blood sugar (glucose) after you have not eaten for a while (fasting). You may have this done every 1-3 years.  Bone density scan. This is done to screen for osteoporosis. You may have this done starting at age 66.  Mammogram. This may be done every 1-2 years. Talk to your health care provider about how often you should have regular mammograms. Talk with your health care provider about your test results, treatment options, and if necessary, the need for more tests. Vaccines  Your health care provider may recommend certain vaccines, such as:  Influenza vaccine. This is recommended every year.  Tetanus, diphtheria, and acellular pertussis (Tdap, Td) vaccine. You may need a Td booster every 10 years.  Zoster  vaccine. You may need this after age 66.  Pneumococcal 13-valent conjugate (PCV13) vaccine. One dose is recommended after age 21.  Pneumococcal polysaccharide (PPSV23) vaccine. One dose is recommended after age 70. Talk to your health care provider about which screenings and vaccines you need and how often you need them. This information is not intended to replace advice given to you by your health care provider. Make sure you discuss any questions you have with your health care provider. Document Released: 03/07/2015 Document Revised: 10/29/2015 Document Reviewed: 12/10/2014 Elsevier Interactive Patient Education  2017 [employer] Prevention in the Home Falls can cause injuries. They can happen to people of all ages. There are many things you can do to make your home safe and to help prevent falls. What can I do on the outside of my home?  Regularly fix the edges of walkways and driveways and fix any cracks.  Remove anything that might make you trip as you walk through a door, such as a raised step or threshold.  Trim any bushes or trees on the path to your home.  Use bright outdoor lighting.  Clear any walking paths of anything that might make someone trip, such as rocks or tools.  Regularly check to see if handrails are loose or broken. Make sure that both sides of any steps have handrails.  Any raised decks and porches should have guardrails on the edges.  Have any leaves, snow, or ice cleared regularly.  Use sand or salt on walking paths during winter.  Clean up any spills in your garage right away. This includes oil or grease spills. What can I do in the bathroom?  Use night lights.  Install grab bars by the toilet and in the tub and shower. Do not use towel bars as grab bars.  Use non-skid mats or decals in the tub or shower.  If you need to sit down in the shower, use a plastic, non-slip stool.  Keep the floor dry. Clean up any water that spills on the  floor as soon as it happens.  Remove soap buildup in the tub or shower regularly.  Attach bath mats securely with double-sided non-slip rug tape.  Do not have throw rugs and other things on the floor that can make you trip. What can I do in the bedroom?  Use night lights.  Make sure that you have a light by your bed that is easy to reach.  Do not use any sheets or blankets that are too big for your bed. They should not hang down onto the floor.  Have a firm chair that has side arms. You can use this for support while you get dressed.  Do not have throw rugs and other things on the floor that can make you trip. What can I do in the kitchen?  Clean up any spills right away.  Avoid walking on wet floors.  Keep items that you use a lot in easy-to-reach places.  If you need to reach something above you, use a strong step stool that has a grab bar.  Keep electrical cords out of the way.  Do not use floor polish or wax that makes floors slippery. If you must use wax, use non-skid floor wax.  Do  not have throw rugs and other things on the floor that can make you trip. What can I do with my stairs?  Do not leave any items on the stairs.  Make sure that there are handrails on both sides of the stairs and use them. Fix handrails that are broken or loose. Make sure that handrails are as long as the stairways.  Check any carpeting to make sure that it is firmly attached to the stairs. Fix any carpet that is loose or worn.  Avoid having throw rugs at the top or bottom of the stairs. If you do have throw rugs, attach them to the floor with carpet tape.  Make sure that you have a light switch at the top of the stairs and the bottom of the stairs. If you do not have them, ask someone to add them for you. What else can I do to help prevent falls?  Wear shoes that:  Do not have high heels.  Have rubber bottoms.  Are comfortable and fit you well.  Are closed at the toe. Do not wear  sandals.  If you use a stepladder:  Make sure that it is fully opened. Do not climb a closed stepladder.  Make sure that both sides of the stepladder are locked into place.  Ask someone to hold it for you, if possible.  Clearly mark and make sure that you can see:  Any grab bars or handrails.  First and last steps.  Where the edge of each step is.  Use tools that help you move around (mobility aids) if they are needed. These include:  Canes.  Walkers.  Scooters.  Crutches.  Turn on the lights when you go into a dark area. Replace any light bulbs as soon as they burn out.  Set up your furniture so you have a clear path. Avoid moving your furniture around.  If any of your floors are uneven, fix them.  If there are any pets around you, be aware of where they are.  Review your medicines with your doctor. Some medicines can make you feel dizzy. This can increase your chance of falling. Ask your doctor what other things that you can do to help prevent falls. This information is not intended to replace advice given to you by your health care provider. Make sure you discuss any questions you have with your health care provider. Document Released: 12/05/2008 Document Revised: 07/17/2015 Document Reviewed: 03/15/2014 Elsevier Interactive Patient Education  2017 [employer].

## 2016-06-30 LAB — COMPREHENSIVE METABOLIC PANEL
ALT: 13 IU/L (ref 0–32)
AST: 19 IU/L (ref 0–40)
Albumin/Globulin Ratio: 1.6 (ref 1.2–2.2)
Albumin: 4.4 g/dL (ref 3.5–4.8)
Alkaline Phosphatase: 106 IU/L (ref 39–117)
BUN/Creatinine Ratio: 13 (ref 12–28)
BUN: 12 mg/dL (ref 8–27)
Bilirubin Total: 0.3 mg/dL (ref 0.0–1.2)
CALCIUM: 9.6 mg/dL (ref 8.7–10.3)
CO2: 26 mmol/L (ref 18–29)
CREATININE: 0.9 mg/dL (ref 0.57–1.00)
Chloride: 100 mmol/L (ref 96–106)
GFR calc Af Amer: 74 mL/min/{1.73_m2} (ref >59)
GFR calc non Af Amer: 65 mL/min/{1.73_m2} (ref >59)
GLOBULIN, TOTAL: 2.8 g/dL (ref 1.5–4.5)
GLUCOSE: 99 mg/dL (ref 65–99)
Potassium: 4.8 mmol/L (ref 3.5–5.2)
SODIUM: 142 mmol/L (ref 134–144)
Total Protein: 7.2 g/dL (ref 6.0–8.5)

## 2016-06-30 LAB — LIPID PANEL
CHOL/HDL RATIO: 3.4 ratio (ref 0.0–4.4)
CHOLESTEROL TOTAL: 225 mg/dL — AB (ref 100–199)
HDL: 66 mg/dL (ref >39)
LDL CALC: 131 mg/dL — AB (ref 0–99)
Triglycerides: 141 mg/dL (ref 0–149)
VLDL CHOLESTEROL CAL: 28 mg/dL (ref 5–40)

## 2016-06-30 LAB — HEPATITIS C ANTIBODY

## 2016-06-30 LAB — TSH: TSH: <0.006 u[IU]/mL — ABNORMAL LOW (ref 0.450–4.500)

## 2016-07-01 ENCOUNTER — Telehealth: Payer: Self-pay | Admitting: Family Medicine

## 2016-07-01 ENCOUNTER — Other Ambulatory Visit: Payer: Self-pay | Admitting: Family Medicine

## 2016-07-01 DIAGNOSIS — I10 Essential (primary) hypertension: Secondary | ICD-10-CM

## 2016-07-01 NOTE — Telephone Encounter (Signed)
Unable to leave a message due to mailbox being full.

## 2016-07-01 NOTE — Telephone Encounter (Signed)
Patient would like to talk to a nurse about her thyroid medications and labs.  She isnt very clear on some things she had talked with the dr. About.  Her call back is [phone]  Thanks, Con Memos

## 2016-07-05 NOTE — Telephone Encounter (Signed)
Spoke with patient and she states she is not having any questions at this time. Patient advised if she does have any issues to let us know, patient understood

## 2016-08-18 ENCOUNTER — Ambulatory Visit: Payer: Medicare Other | Admitting: Family Medicine

## 2016-10-18 ENCOUNTER — Encounter: Payer: Self-pay | Admitting: Family Medicine

## 2016-10-18 ENCOUNTER — Ambulatory Visit (INDEPENDENT_AMBULATORY_CARE_PROVIDER_SITE_OTHER): Payer: Medicare Other | Admitting: Family Medicine

## 2016-10-18 VITALS — BP 152/62 | HR 60 | Temp 97.7°F | Resp 16 | Wt 135.8 lb

## 2016-10-18 DIAGNOSIS — E039 Hypothyroidism, unspecified: Secondary | ICD-10-CM

## 2016-10-18 DIAGNOSIS — Z23 Encounter for immunization: Secondary | ICD-10-CM | POA: Diagnosis not present

## 2016-10-18 DIAGNOSIS — I10 Essential (primary) hypertension: Secondary | ICD-10-CM

## 2016-10-18 MED ORDER — LOSARTAN POTASSIUM 50 MG PO TABS: 50.0000 mg | ORAL_TABLET | Freq: Every day | ORAL | 3 refills | Status: DC

## 2016-10-18 NOTE — Patient Instructions (Signed)
Stop amlodipine and get new rx sent into pharmacy today and start it. The new medication is Losartan 50 mg, take it once daily. Follow up in 1 month.

## 2016-10-18 NOTE — Progress Notes (Signed)
Patient: Paige Baldwin Female    DOB: January 27, 1946   71 y.o.   MRN: [id number] Visit Date: 10/18/2016  Today's Provider: [name], MD   Chief Complaint  Patient presents with  . Hypertension  . Hypothyroidism   Subjective:    HPI  Hypertension, follow-up:  BP Readings from Last 3 Encounters:  10/18/16 (!) 152/68  06/29/16 (!) 156/62  05/19/16 (!) 160/81    She was last seen for hypertension 3 months ago.  BP at that visit was 156/62. Management since that visit includes added amlodipine 5 mg. She reports good compliance with treatment. She is not having side effects.  She is exercising. She is adherent to low salt diet.   Outside blood pressures are not being checked. Patient denies chest pain, chest pressure/discomfort, claudication, dyspnea, exertional chest pressure/discomfort, fatigue, irregular heart beat, lower extremity edema, near-syncope, orthopnea, palpitations, paroxysmal nocturnal dyspnea, syncope and tachypnea.   Cardiovascular risk factors include advanced age (older than 25 for men, 86 for women) and hypertension.  Wt Readings from Last 3 Encounters:  10/18/16 135 lb 12.8 oz (61.6 kg)  06/29/16 131 lb (59.4 kg)  05/19/16 129 lb 12.8 oz (58.9 kg)   ------------------------------------------------------------------------ Hypothyroidism- according to labs on 06/29/16. thyroid was over treated and pt instructed to stop synthroid and recheck TSH in 3-4 weeks. Pt did this. She reports that she can not tell a difference in how she feels with out it. She is feeling pretty good.      No Known Allergies   Current Outpatient Prescriptions:  .  amLODipine (NORVASC) 5 MG tablet, Take 1 tablet (5 mg total) by mouth daily., Disp: 90 tablet, Rfl: 3 .  MAGNESIUM MALATE PO, Use., Disp: , Rfl:  .  Menaquinone-7 (VITAMIN K2 PO), Take by mouth., Disp: , Rfl:  .  metoprolol (LOPRESSOR) 50 MG tablet, TAKE ONE TABLET BY MOUTH EVERY DAY, Disp: 30 tablet, Rfl:  12 .  omeprazole (PRILOSEC) 40 MG capsule, TAKE ONE CAPSULE BY MOUTH EVERY DAY 30 MINUTES BEFORE BREAKFAST, Disp: 30 capsule, Rfl: 11 .  potassium chloride (K-DUR,KLOR-CON) 10 MEQ tablet, TAKE ONE TABLET BY MOUTH EVERY DAY, Disp: 30 tablet, Rfl: 12 .  sertraline (ZOLOFT) 50 MG tablet, Take 1 tablet (50 mg total) by mouth daily., Disp: 30 tablet, Rfl: 11 .  COLOSTRUM PO, Take by mouth., Disp: , Rfl:  .  Digestive Enzymes (PAPAYA ENZYME PO), Take by mouth., Disp: , Rfl:  .  levothyroxine (SYNTHROID, LEVOTHROID) 125 MCG tablet, Take 1 tablet (125 mcg total) by mouth daily before breakfast. (Patient not taking: Reported on 10/18/2016), Disp: 90 tablet, Rfl: 3  Review of Systems  Constitutional: Negative.   HENT: Negative.   Eyes: Negative.   Respiratory: Negative.   Cardiovascular: Negative.   Gastrointestinal: Negative.   Endocrine: Negative.   Genitourinary: Negative.   Musculoskeletal: Negative.   Skin: Negative.   Allergic/Immunologic: Negative.   Neurological: Negative.   Hematological: Negative.   Psychiatric/Behavioral: Negative.     Social History  Substance Use Topics  . Smoking status: Never Smoker  . Smokeless tobacco: Never Used  . Alcohol use No   Objective:   BP (!) 152/68 (BP Location: Left Arm, Patient Position: Sitting, Cuff Size: Normal)   Pulse 60   Temp 97.7 F (36.5 C) (Oral)   Resp 16   Wt 135 lb 12.8 oz (61.6 kg)   LMP  (LMP Unknown)   BMI 26.52 kg/m  Vitals:  10/18/16 1024  BP: (!) 152/68  Pulse: 60  Resp: 16  Temp: 97.7 F (36.5 C)  TempSrc: Oral  Weight: 135 lb 12.8 oz (61.6 kg)     Physical Exam  Constitutional: She is oriented to person, place, and time. She appears well-developed and well-nourished.  HENT:  Head: Normocephalic and atraumatic.  Right Ear: External ear normal.  Left Ear: External ear normal.  Nose: Nose normal.  Eyes: Conjunctivae are normal. No scleral icterus.  Neck: No thyromegaly present.  Cardiovascular: Normal  rate, regular rhythm and normal heart sounds.   Pulmonary/Chest: Effort normal and breath sounds normal.  Abdominal: Soft.  Neurological: She is alert and oriented to person, place, and time.  Skin: Skin is warm and dry.  Psychiatric: She has a normal mood and affect. Her behavior is normal. Judgment and thought content normal.        Assessment & Plan:       1. Essential hypertension Stop amlodipine due to not working. Start Losartan. Follow up in 1 month.  - losartan (COZAAR) 50 MG tablet; Take 1 tablet (50 mg total) by mouth daily.  Dispense: 90 tablet; Refill: 3  2. Adult hypothyroidism  - TSH  3. Need for influenza vaccination  - Flu vaccine HIGH DOSE PF     HPI, Exam, and A&P Transcribed under the direction and in the presence of Phu Record L. Cranford Mon, MD  Electronically Signed: Katina Dung, CMA I have done the exam and reviewed the above chart and it is accurate to the best of my knowledge. Development worker, community has been used in this note in any air is in the dictation or transcription are unintentional.   [name], MD  [place]

## 2016-10-19 ENCOUNTER — Telehealth: Payer: Self-pay

## 2016-10-19 LAB — TSH: TSH: 23.04 u[IU]/mL — ABNORMAL HIGH (ref 0.450–4.500)

## 2016-10-19 MED ORDER — LEVOTHYROXINE SODIUM 75 MCG PO TABS: 75.0000 ug | ORAL_TABLET | Freq: Every day | ORAL | 5 refills | Status: DC

## 2016-10-19 NOTE — Telephone Encounter (Signed)
Advised patient of results. Medication was sent into the pharmacy.  

## 2016-10-19 NOTE — Telephone Encounter (Signed)
-----   Message from Jerrol Banana., MD sent at 10/19/2016  8:26 AM EDT ----- Restart Synthroid at 75 g daily

## 2016-11-22 ENCOUNTER — Ambulatory Visit: Payer: Self-pay | Admitting: Family Medicine

## 2016-12-14 ENCOUNTER — Other Ambulatory Visit: Payer: Self-pay | Admitting: Family Medicine

## 2016-12-14 DIAGNOSIS — F419 Anxiety disorder, unspecified: Secondary | ICD-10-CM

## 2016-12-16 ENCOUNTER — Ambulatory Visit
Admission: RE | Admit: 2016-12-16 | Discharge: 2016-12-16 | Disposition: A | Discharge status: Home / Self Care | Payer: Medicare Other | Source: Ambulatory Visit | Attending: Family Medicine | Admitting: Family Medicine

## 2016-12-16 ENCOUNTER — Ambulatory Visit (INDEPENDENT_AMBULATORY_CARE_PROVIDER_SITE_OTHER): Payer: Medicare Other | Admitting: Family Medicine

## 2016-12-16 ENCOUNTER — Encounter: Payer: Self-pay | Admitting: Family Medicine

## 2016-12-16 VITALS — BP 160/80 | HR 76 | Temp 97.7°F | Resp 14 | Wt 138.0 lb

## 2016-12-16 DIAGNOSIS — E7800 Pure hypercholesterolemia, unspecified: Secondary | ICD-10-CM

## 2016-12-16 DIAGNOSIS — I10 Essential (primary) hypertension: Secondary | ICD-10-CM

## 2016-12-16 DIAGNOSIS — R079 Chest pain, unspecified: Secondary | ICD-10-CM

## 2016-12-16 DIAGNOSIS — G3184 Mild cognitive impairment, so stated: Secondary | ICD-10-CM | POA: Diagnosis not present

## 2016-12-16 DIAGNOSIS — M47812 Spondylosis without myelopathy or radiculopathy, cervical region: Secondary | ICD-10-CM | POA: Diagnosis not present

## 2016-12-16 DIAGNOSIS — E039 Hypothyroidism, unspecified: Secondary | ICD-10-CM

## 2016-12-16 DIAGNOSIS — M542 Cervicalgia: Secondary | ICD-10-CM | POA: Diagnosis not present

## 2016-12-16 MED ORDER — AMLODIPINE BESYLATE 5 MG PO TABS: 5.0000 mg | ORAL_TABLET | Freq: Every day | ORAL | 11 refills | Status: DC

## 2016-12-16 NOTE — Progress Notes (Signed)
Patient: Paige Baldwin Female    DOB: 12-Dec-1945   71 y.o.   MRN: [id number] Visit Date: 12/16/2016  Today's Provider: [name], MD   Chief Complaint  Patient presents with  . Memory Loss   Subjective:    HPI  Hypertension, follow-up:  BP Readings from Last 3 Encounters:  10/18/16 (!) 152/62  06/29/16 (!) 156/62  05/19/16 (!) 160/81  She does admit to very nonspecific chest tightness with exertion which worries her husband.  She was last seen for hypertension 2 months ago.  BP at that visit was 152/62. Management since that visit includes discontinued Amlodipine and start losartan. She reports good compliance with treatment. She is not having side effects.   She is not exercising. She is adherent to low salt diet.   Outside blood pressures are running 150's/90's at home. She is experiencing chest pain.  Patient denies claudication, dyspnea, exertional chest pressure/discomfort, fatigue, irregular heart beat, lower extremity edema, near-syncope, orthopnea, palpitations, paroxysmal nocturnal dyspnea, syncope and tachypnea.    Wt Readings from Last 3 Encounters:  10/18/16 135 lb 12.8 oz (61.6 kg)  06/29/16 131 lb (59.4 kg)  05/19/16 129 lb 12.8 oz (58.9 kg)   ------------------------------------------------------------------------  Per appt comments husband and children are worried about patients memory. MMSE 26/30. Pt reports that she has been having pain in her shoulder and chest and left arm. She also has had some neck pain and a white film on the sides of her mouth for 2-3 weeks. " I have white gunk that comes out of my cheeks" She had little sore/bumps in the roof of her mouth. She reports that her tongue is a little sore when she eats certain foods. Husband reports that that when she gets the "gunk" out of her mouth it is thick and she can literally pull it out.      No Known Allergies   Current Outpatient Prescriptions:  .  COLOSTRUM PO, Take by  mouth., Disp: , Rfl:  .  Digestive Enzymes (PAPAYA ENZYME PO), Take by mouth., Disp: , Rfl:  .  levothyroxine (SYNTHROID, LEVOTHROID) 75 MCG tablet, Take 1 tablet (75 mcg total) by mouth daily before breakfast., Disp: 30 tablet, Rfl: 5 .  losartan (COZAAR) 50 MG tablet, Take 1 tablet (50 mg total) by mouth daily., Disp: 90 tablet, Rfl: 3 .  MAGNESIUM MALATE PO, Use., Disp: , Rfl:  .  Menaquinone-7 (VITAMIN K2 PO), Take by mouth., Disp: , Rfl:  .  metoprolol (LOPRESSOR) 50 MG tablet, TAKE ONE TABLET BY MOUTH EVERY DAY, Disp: 30 tablet, Rfl: 12 .  omeprazole (PRILOSEC) 40 MG capsule, TAKE ONE CAPSULE BY MOUTH EVERY DAY 30 MINUTES BEFORE BREAKFAST, Disp: 30 capsule, Rfl: 11 .  potassium chloride (K-DUR,KLOR-CON) 10 MEQ tablet, TAKE ONE TABLET BY MOUTH EVERY DAY, Disp: 30 tablet, Rfl: 12 .  sertraline (ZOLOFT) 100 MG tablet, TAKE ONE TABLET BY MOUTH EVERY DAY, Disp: 90 tablet, Rfl: 3  Review of Systems  Constitutional: Negative.   HENT: Negative.   Eyes: Negative.   Respiratory: Negative.   Cardiovascular: Negative.   Gastrointestinal: Negative.   Endocrine: Negative.   Genitourinary: Negative.   Musculoskeletal: Negative.   Skin: Negative.   Allergic/Immunologic: Negative.   Neurological: Negative.   Hematological: Negative.   Psychiatric/Behavioral: Negative.     Social History  Substance Use Topics  . Smoking status: Never Smoker  . Smokeless tobacco: Never Used  . Alcohol use No   Objective:  LMP  (LMP Unknown)  There were no vitals filed for this visit.   Physical Exam  Constitutional: She is oriented to person, place, and time. She appears well-developed and well-nourished.  Eyes: Pupils are equal, round, and reactive to light. Conjunctivae and EOM are normal.  Neck: Normal range of motion. Neck supple.  Tender post cervical lympnodes  Cardiovascular: Normal rate, regular rhythm, normal heart sounds and intact distal pulses.   Pulmonary/Chest: Effort normal and breath  sounds normal.  Abdominal: Soft. Bowel sounds are normal.  Musculoskeletal: Normal range of motion.  Neurological: She is alert and oriented to person, place, and time. She has normal reflexes.  Skin: Skin is warm and dry.  Psychiatric: She has a normal mood and affect. Her behavior is normal. Judgment and thought content normal.        Assessment & Plan:     1. Essential hypertension Restart amlodipine. Follow up in 1 month - amLODipine (NORVASC) 5 MG tablet; Take 1 tablet (5 mg total) by mouth daily.  Dispense: 30 tablet; Refill: 11 - CBC with Differential/Platelet  2. Chest pain, unspecified type Exertional--refer to cardiology. - EKG 12-Lead - Sedimentation rate - Ambulatory referral to Cardiology  3. Mild cognitive impairment MMSE 26/30--pt definitely having memory issues over past couple of years. - Ambulatory referral to Neurology  4. Neck pain  - DG Cervical Spine Complete; Future - Sedimentation rate  5. Pure hypercholesterolemia  - Lipid panel - Comprehensive metabolic panel  6. Adult hypothyroidism  - TSH     HPI, Exam, and A&P Transcribed under the direction and in the presence of Richard L. Cranford Mon, MD  Electronically Signed: Katina Dung, CMA  I have done the exam and reviewed the above chart and it is accurate to the best of my knowledge. Development worker, community has been used in this note in any air is in the dictation or transcription are unintentional.  [name], MD  [place]

## 2016-12-16 NOTE — Patient Instructions (Addendum)
Try a probiotic daily and start baby aspirin daily. Restart amlodipine daily.

## 2016-12-17 LAB — CBC WITH DIFFERENTIAL/PLATELET
BASOS ABS: 27 {cells}/uL (ref 0–200)
BASOS PCT: 0.4 %
EOS ABS: 47 {cells}/uL (ref 15–500)
Eosinophils Relative: 0.7 %
HCT: 36.6 % (ref 35.0–45.0)
Hemoglobin: 12.1 g/dL (ref 11.7–15.5)
Lymphs Abs: 2506 cells/uL (ref 850–3900)
MCH: 27 pg (ref 27.0–33.0)
MCHC: 33.1 g/dL (ref 32.0–36.0)
MCV: 81.7 fL (ref 80.0–100.0)
MPV: 9.6 fL (ref 7.5–12.5)
Monocytes Relative: 19.9 %
Neutro Abs: 2787 cells/uL (ref 1500–7800)
Neutrophils Relative %: 41.6 %
PLATELETS: 241 10*3/uL (ref 140–400)
RBC: 4.48 10*6/uL (ref 3.80–5.10)
RDW: 13.5 % (ref 11.0–15.0)
TOTAL LYMPHOCYTE: 37.4 %
WBC: 6.7 10*3/uL (ref 3.8–10.8)
WBCMIX: 1333 {cells}/uL — AB (ref 200–950)

## 2016-12-17 LAB — LIPID PANEL
CHOL/HDL RATIO: 3.7 (calc) (ref <5.0)
Cholesterol: 292 mg/dL — ABNORMAL HIGH (ref <200)
HDL: 78 mg/dL (ref >50)
LDL CHOLESTEROL (CALC): 195 mg/dL — AB
NON-HDL CHOLESTEROL (CALC): 214 mg/dL — AB (ref <130)
Triglycerides: 82 mg/dL (ref <150)

## 2016-12-17 LAB — COMPLETE METABOLIC PANEL WITH GFR
AG Ratio: 1.5 (calc) (ref 1.0–2.5)
ALBUMIN MSPROF: 4.6 g/dL (ref 3.6–5.1)
ALT: 14 U/L (ref 6–29)
AST: 20 U/L (ref 10–35)
Alkaline phosphatase (APISO): 96 U/L (ref 33–130)
BUN / CREAT RATIO: 11 (calc) (ref 6–22)
BUN: 12 mg/dL (ref 7–25)
CALCIUM: 9.3 mg/dL (ref 8.6–10.4)
CHLORIDE: 100 mmol/L (ref 98–110)
CO2: 30 mmol/L (ref 20–32)
Creat: 1.1 mg/dL — ABNORMAL HIGH (ref 0.60–0.93)
GFR, EST AFRICAN AMERICAN: 58 mL/min/{1.73_m2} — AB (ref >=60)
GFR, EST NON AFRICAN AMERICAN: 50 mL/min/{1.73_m2} — AB (ref >=60)
GLUCOSE: 100 mg/dL — AB (ref 65–99)
Globulin: 3.1 g/dL (calc) (ref 1.9–3.7)
Potassium: 4.2 mmol/L (ref 3.5–5.3)
Sodium: 139 mmol/L (ref 135–146)
TOTAL PROTEIN: 7.7 g/dL (ref 6.1–8.1)
Total Bilirubin: 0.6 mg/dL (ref 0.2–1.2)

## 2016-12-17 LAB — TSH: TSH: 3.08 m[IU]/L (ref 0.40–4.50)

## 2016-12-17 LAB — SEDIMENTATION RATE: SED RATE: 11 mm/h (ref 0–30)

## 2016-12-30 DIAGNOSIS — R197 Diarrhea, unspecified: Secondary | ICD-10-CM | POA: Diagnosis not present

## 2016-12-30 DIAGNOSIS — E538 Deficiency of other specified B group vitamins: Secondary | ICD-10-CM | POA: Diagnosis not present

## 2016-12-30 DIAGNOSIS — G3184 Mild cognitive impairment, so stated: Secondary | ICD-10-CM | POA: Diagnosis not present

## 2016-12-30 DIAGNOSIS — E559 Vitamin D deficiency, unspecified: Secondary | ICD-10-CM | POA: Diagnosis not present

## 2016-12-30 DIAGNOSIS — H259 Unspecified age-related cataract: Secondary | ICD-10-CM | POA: Diagnosis not present

## 2016-12-31 ENCOUNTER — Other Ambulatory Visit (HOSPITAL_COMMUNITY): Payer: Self-pay | Admitting: Neurology

## 2016-12-31 ENCOUNTER — Other Ambulatory Visit: Payer: Self-pay | Admitting: Neurology

## 2016-12-31 DIAGNOSIS — G3184 Mild cognitive impairment, so stated: Secondary | ICD-10-CM

## 2017-01-11 ENCOUNTER — Ambulatory Visit (HOSPITAL_COMMUNITY)
Admission: RE | Admit: 2017-01-11 | Discharge: 2017-01-11 | Disposition: A | Discharge status: Home / Self Care | Payer: Medicare Other | Source: Ambulatory Visit | Attending: Neurology | Admitting: Neurology

## 2017-01-11 DIAGNOSIS — G3184 Mild cognitive impairment, so stated: Secondary | ICD-10-CM | POA: Insufficient documentation

## 2017-01-11 DIAGNOSIS — I609 Nontraumatic subarachnoid hemorrhage, unspecified: Secondary | ICD-10-CM | POA: Diagnosis not present

## 2017-01-11 DIAGNOSIS — R51 Headache: Secondary | ICD-10-CM | POA: Diagnosis not present

## 2017-01-24 ENCOUNTER — Telehealth: Payer: Self-pay

## 2017-01-24 ENCOUNTER — Ambulatory Visit (INDEPENDENT_AMBULATORY_CARE_PROVIDER_SITE_OTHER): Payer: Medicare Other | Admitting: Family Medicine

## 2017-01-24 VITALS — BP 156/68 | HR 72 | Temp 98.2°F | Resp 14 | Wt 138.6 lb

## 2017-01-24 DIAGNOSIS — E7800 Pure hypercholesterolemia, unspecified: Secondary | ICD-10-CM

## 2017-01-24 DIAGNOSIS — R079 Chest pain, unspecified: Secondary | ICD-10-CM | POA: Diagnosis not present

## 2017-01-24 DIAGNOSIS — G3184 Mild cognitive impairment, so stated: Secondary | ICD-10-CM | POA: Diagnosis not present

## 2017-01-24 DIAGNOSIS — M542 Cervicalgia: Secondary | ICD-10-CM

## 2017-01-24 DIAGNOSIS — K117 Disturbances of salivary secretion: Secondary | ICD-10-CM

## 2017-01-24 DIAGNOSIS — I10 Essential (primary) hypertension: Secondary | ICD-10-CM | POA: Diagnosis not present

## 2017-01-24 MED ORDER — ISOSORBIDE MONONITRATE ER 30 MG PO TB24: 30.0000 mg | ORAL_TABLET | ORAL | 12 refills | Status: DC

## 2017-01-24 MED ORDER — ROSUVASTATIN CALCIUM 10 MG PO TABS: 10.0000 mg | ORAL_TABLET | Freq: Every day | ORAL | 12 refills | Status: DC

## 2017-01-24 NOTE — Telephone Encounter (Signed)
Pt scheduled with Dr Nehemiah Massed 01/25/17 at 1:30.Dr Rockey Situ does not have any appointments until Jan

## 2017-01-24 NOTE — Patient Instructions (Signed)
Take Aspirin daily. Stop Omeprazole for now. Starting 2 new medications today. Try Probiotic daily, ask the pharmacist. This is over the counter.

## 2017-01-24 NOTE — Telephone Encounter (Signed)
Ok thank Edrick Kins, RMA

## 2017-01-24 NOTE — Telephone Encounter (Signed)
Paige Baldwin, patient has appointment scheduled to see Dr Rockey Situ on 03/07/17. Can we try to move that up? Patient still has elevated blood pressure and chest pain. Thank you.-Anastasiya Estell Harpin, RMA

## 2017-01-24 NOTE — Progress Notes (Signed)
Paige Baldwin  MRN: [id number] DOB: 12-15-1945  Subjective:  HPI  Patient is here for 1 month follow up, last office visit was on 12/16/16. HTN: patient was advised to restart Amlodipine. Patient has not been checking her b/p at home. BP Readings from Last 3 Encounters:  01/24/17 (!) 156/68  12/16/16 (!) 160/80  10/18/16 (!) 152/62   MCI: MMSE score was 26/30 last time and patient was referred to neurology. Patient saw Dr Manuella Ghazi once and had MRI of the brain done per his order but they have not been advised of the results yet. Chest pain: it was noted to be exertional on last visit, EKG was done and patient was referred to cardiology. Patient has appointment with Dr Rockey Situ on 03/07/17. Patient is still getting chest pain on the left side of the chest. Pain in the left side of the neck present and radiates to the left arm and down to her chest. Cervical spine xray was done on the last visit and it showed degenerative disc disease.  Routine lab work and Sed Rate were checked on 12/16/16 and it was noted to discuss cholesterol treatment on next visit Patient Active Problem List   Diagnosis Date Noted  . Anxiety 07/12/2014  . Clinical depression 07/12/2014  . Acid reflux 07/12/2014  . Autoimmune lymphocytic chronic thyroiditis 07/12/2014  . HLD (hyperlipidemia) 07/12/2014  . BP (high blood pressure) 07/12/2014  . Adult hypothyroidism 07/12/2014  . Mild major depression ([place]) 07/12/2014  . Arthritis, degenerative 07/12/2014  . Post menopausal syndrome 07/12/2014  . CMC arthritis, thumb, degenerative 06/25/2014    Past Medical History:  Diagnosis Date  . Depression   . GERD (gastroesophageal reflux disease)   . Hypertension   . Hypothyroidism     Social History   Socioeconomic History  . Marital status: Married    Spouse name: Not on file  . Number of children: Not on file  . Years of education: Not on file  . Highest education level: Not on file  Social Needs  . Financial  resource strain: Not on file  . Food insecurity - worry: Not on file  . Food insecurity - inability: Not on file  . Transportation needs - medical: Not on file  . Transportation needs - non-medical: Not on file  Occupational History  . Not on file  Tobacco Use  . Smoking status: Never Smoker  . Smokeless tobacco: Never Used  Substance and Sexual Activity  . Alcohol use: No  . Drug use: No  . Sexual activity: Not on file  Other Topics Concern  . Not on file  Social History Narrative  . Not on file    Outpatient Encounter Medications as of 01/24/2017  Medication Sig Note  . amLODipine (NORVASC) 5 MG tablet Take 1 tablet (5 mg total) by mouth daily.   . COLOSTRUM PO Take by mouth. 02/11/2016: Received from: [place]: Take by mouth.  . Digestive Enzymes (PAPAYA ENZYME PO) Take by mouth. 02/11/2016: Received from: [place]: Take by mouth.  . levothyroxine (SYNTHROID, LEVOTHROID) 75 MCG tablet Take 1 tablet (75 mcg total) by mouth daily before breakfast.   . losartan (COZAAR) 50 MG tablet Take 1 tablet (50 mg total) by mouth daily.   Marland Kitchen MAGNESIUM MALATE PO Use. 02/11/2016: Received from: [facility]  . Menaquinone-7 (VITAMIN K2 PO) Take by mouth. 02/11/2016: Received from: [place]: Take by mouth.  Marland Kitchen  metoprolol (LOPRESSOR) 50 MG tablet TAKE ONE TABLET BY MOUTH EVERY DAY   . omeprazole (PRILOSEC) 40 MG capsule TAKE ONE CAPSULE BY MOUTH EVERY DAY 30 MINUTES BEFORE BREAKFAST   . potassium chloride (K-DUR,KLOR-CON) 10 MEQ tablet TAKE ONE TABLET BY MOUTH EVERY DAY   . sertraline (ZOLOFT) 100 MG tablet TAKE ONE TABLET BY MOUTH EVERY DAY    No facility-administered encounter medications on file as of 01/24/2017.     No Known Allergies  Review of Systems  Constitutional: Negative.   HENT:       Excessive saliva  Respiratory: Negative.   Cardiovascular: Positive for chest  pain. Negative for leg swelling.  Gastrointestinal: Negative.   Musculoskeletal: Positive for joint pain and neck pain. Negative for falls.  Skin: Negative.   Neurological: Negative.   Endo/Heme/Allergies: Negative.   Psychiatric/Behavioral: Positive for memory loss.    Objective:  BP (!) 156/68   Pulse 72   Temp 98.2 F (36.8 C)   Resp 14   Wt 138 lb 9.6 oz (62.9 kg)   LMP  (LMP Unknown)   BMI 27.07 kg/m   Physical Exam  Constitutional: She is oriented to person, place, and time and well-developed, well-nourished, and in no distress.  HENT:  Head: Normocephalic and atraumatic.  Mouth/Throat: Oropharynx is clear and moist.  Eyes: Conjunctivae are normal. Pupils are equal, round, and reactive to light.  Neck: Normal range of motion.  Cardiovascular: Normal rate, regular rhythm, normal heart sounds and intact distal pulses. Exam reveals no gallop.  No murmur heard. Pulmonary/Chest: Effort normal and breath sounds normal. No respiratory distress. She has no wheezes.  Neurological: She is alert and oriented to person, place, and time.  Psychiatric: Mood, memory, affect and judgment normal.   Assessment and Plan :  1. Essential hypertension Still elevated. Add Imdur. Re check in 1 month. - EKG 12-Lead - isosorbide mononitrate (IMDUR) 30 MG 24 hr tablet; Take 1 tablet (30 mg total) by mouth every morning.  Dispense: 30 tablet; Refill: 12  2. Chest pain, unspecified type Repeat EKG today is stable. Adding Imdur could help chest pain. Will try to move up Dr Donivan Scull appointment so patient can be seen before the new year. Patient advised to take Aspirin daily. - EKG 12-Lead - isosorbide mononitrate (IMDUR) 30 MG 24 hr tablet; Take 1 tablet (30 mg total) by mouth every morning.  Dispense: 30 tablet; Refill: 12  3. Mild cognitive impairment Seen Dr Manuella Ghazi. Went over recent MRI results and I will speak with Dr Manuella Ghazi about the findings.  4. Neck pain 5. Pure hypercholesterolemia Start  Crestor. Discussed possible side effects of this medication. Re check in 1 month - rosuvastatin (CRESTOR) 10 MG tablet; Take 1 tablet (10 mg total) by mouth at bedtime.  Dispense: 30 tablet; Refill: 12  6. Salivation excessive Exam normal. Not sure the etiology. Patient advised to try Probiotic OTC and stop Omeprazole in case this is contributing. Re check in 1 month.  HPI, Exam and A&P transcribed by Tiffany Kocher, RMA under direction and in the presence of Miguel Aschoff, MD. I have done the exam and reviewed the chart and it is accurate to the best of my knowledge. Development worker, community has been used and  any errors in dictation or transcription are unintentional. Miguel Aschoff M.D. [facility]

## 2017-01-25 DIAGNOSIS — E7800 Pure hypercholesterolemia, unspecified: Secondary | ICD-10-CM | POA: Diagnosis not present

## 2017-01-25 DIAGNOSIS — I208 Other forms of angina pectoris: Secondary | ICD-10-CM | POA: Diagnosis not present

## 2017-01-25 DIAGNOSIS — I10 Essential (primary) hypertension: Secondary | ICD-10-CM | POA: Diagnosis not present

## 2017-01-25 NOTE — Telephone Encounter (Signed)
thanks

## 2017-02-09 DIAGNOSIS — I208 Other forms of angina pectoris: Secondary | ICD-10-CM | POA: Diagnosis not present

## 2017-02-09 DIAGNOSIS — R0782 Intercostal pain: Secondary | ICD-10-CM | POA: Diagnosis not present

## 2017-02-09 DIAGNOSIS — E7800 Pure hypercholesterolemia, unspecified: Secondary | ICD-10-CM | POA: Diagnosis not present

## 2017-02-09 DIAGNOSIS — I10 Essential (primary) hypertension: Secondary | ICD-10-CM | POA: Diagnosis not present

## 2017-02-24 ENCOUNTER — Ambulatory Visit: Payer: Medicare Other | Admitting: Family Medicine

## 2017-03-01 DIAGNOSIS — K52832 Lymphocytic colitis: Secondary | ICD-10-CM | POA: Diagnosis not present

## 2017-03-01 DIAGNOSIS — E063 Autoimmune thyroiditis: Secondary | ICD-10-CM | POA: Diagnosis not present

## 2017-03-01 DIAGNOSIS — Z86010 Personal history of colonic polyps: Secondary | ICD-10-CM | POA: Diagnosis not present

## 2017-03-01 DIAGNOSIS — R197 Diarrhea, unspecified: Secondary | ICD-10-CM | POA: Diagnosis not present

## 2017-03-02 ENCOUNTER — Encounter: Payer: Self-pay | Admitting: Family Medicine

## 2017-03-02 ENCOUNTER — Ambulatory Visit (INDEPENDENT_AMBULATORY_CARE_PROVIDER_SITE_OTHER): Payer: Medicare Other | Admitting: Family Medicine

## 2017-03-02 VITALS — BP 148/62 | HR 72 | Temp 97.8°F | Resp 14 | Wt 138.2 lb

## 2017-03-02 DIAGNOSIS — I10 Essential (primary) hypertension: Secondary | ICD-10-CM

## 2017-03-02 DIAGNOSIS — G3184 Mild cognitive impairment, so stated: Secondary | ICD-10-CM

## 2017-03-02 DIAGNOSIS — E039 Hypothyroidism, unspecified: Secondary | ICD-10-CM

## 2017-03-02 DIAGNOSIS — F320 Major depressive disorder, single episode, mild: Secondary | ICD-10-CM

## 2017-03-02 DIAGNOSIS — K219 Gastro-esophageal reflux disease without esophagitis: Secondary | ICD-10-CM

## 2017-03-02 DIAGNOSIS — E7800 Pure hypercholesterolemia, unspecified: Secondary | ICD-10-CM

## 2017-03-02 DIAGNOSIS — F419 Anxiety disorder, unspecified: Secondary | ICD-10-CM | POA: Diagnosis not present

## 2017-03-02 DIAGNOSIS — R079 Chest pain, unspecified: Secondary | ICD-10-CM

## 2017-03-02 DIAGNOSIS — K117 Disturbances of salivary secretion: Secondary | ICD-10-CM | POA: Diagnosis not present

## 2017-03-02 DIAGNOSIS — R739 Hyperglycemia, unspecified: Secondary | ICD-10-CM | POA: Diagnosis not present

## 2017-03-02 MED ORDER — AMLODIPINE BESYLATE 10 MG PO TABS: 10.0000 mg | ORAL_TABLET | Freq: Every day | ORAL | 12 refills | Status: DC

## 2017-03-02 NOTE — Progress Notes (Signed)
Paige Baldwin  MRN: [id number] DOB: 1945-03-30  Subjective:  HPI  Patient is here for 1 month follow up. Last office visit as on 01/24/17. Hypertension: patient was advised to start Imdur since b/p was still running high. Patient states she did not get this prescription for this that she knows off. She will double check at home to see if she is taking this. Not checking her b/p at home. Patient is still having left chest pain that radiates to her shoulder and left arm. EKG was done on her last visit at that time. Pain is about the same. Patient did see Dr Nehemiah Massed on 02/19/17 after last visit with [place] and had work up for this including stress test. Patient was advised everything looked ok, no explanation for the pain. BP Readings from Last 3 Encounters:  03/02/17 (!) 148/62  01/24/17 (!) 156/68  12/16/16 (!) 160/80   Hyperlipidemia: patient was advised to start Crestor and re check in 1 month. Patient is taking this medication with no side effect. Lab Results  Component Value Date   CHOL 292 (H) 12/16/2016   HDL 78 12/16/2016   LDLCALC 131 (H) 06/29/2016   TRIG 82 12/16/2016   CHOLHDL 3.7 12/16/2016   Excessive salivation- patient was advised to try Probiotic and stop Omeprazole to see if this helped. Patient has been taking Probiotic and stopped Omeprazole. Patient states this issue is not constant so she can not tell if this regimen has helped or not. Husband states patient has not complained about this issue as much the past couple weeks or so, so maybe this is helping. Patient Active Problem List   Diagnosis Date Noted  . Anxiety 07/12/2014  . Clinical depression 07/12/2014  . Acid reflux 07/12/2014  . Autoimmune lymphocytic chronic thyroiditis 07/12/2014  . HLD (hyperlipidemia) 07/12/2014  . BP (high blood pressure) 07/12/2014  . Adult hypothyroidism 07/12/2014  . Mild major depression ([place]) 07/12/2014  . Arthritis, degenerative 07/12/2014  . Post menopausal syndrome  07/12/2014  . CMC arthritis, thumb, degenerative 06/25/2014    Past Medical History:  Diagnosis Date  . Depression   . GERD (gastroesophageal reflux disease)   . Hypertension   . Hypothyroidism     Social History   Socioeconomic History  . Marital status: Married    Spouse name: Not on file  . Number of children: Not on file  . Years of education: Not on file  . Highest education level: Not on file  Social Needs  . Financial resource strain: Not on file  . Food insecurity - worry: Not on file  . Food insecurity - inability: Not on file  . Transportation needs - medical: Not on file  . Transportation needs - non-medical: Not on file  Occupational History  . Not on file  Tobacco Use  . Smoking status: Never Smoker  . Smokeless tobacco: Never Used  Substance and Sexual Activity  . Alcohol use: No  . Drug use: No  . Sexual activity: Not on file  Other Topics Concern  . Not on file  Social History Narrative  . Not on file    Outpatient Encounter Medications as of 03/02/2017  Medication Sig Note  . amLODipine (NORVASC) 5 MG tablet Take 1 tablet (5 mg total) by mouth daily.   . COLOSTRUM PO Take by mouth. 02/11/2016: Received from: [place]: Take by mouth.  . Digestive Enzymes (PAPAYA ENZYME PO) Take by mouth. 02/11/2016: Received from: [facility]  System Received Sig: Take by mouth.  . levothyroxine (SYNTHROID, LEVOTHROID) 75 MCG tablet Take 1 tablet (75 mcg total) by mouth daily before breakfast.   . losartan (COZAAR) 50 MG tablet Take 1 tablet (50 mg total) by mouth daily.   Marland Kitchen MAGNESIUM MALATE PO Use. 02/11/2016: Received from: [facility]  . Menaquinone-7 (VITAMIN K2 PO) Take by mouth. 02/11/2016: Received from: [place]: Take by mouth.  . metoprolol (LOPRESSOR) 50 MG tablet TAKE ONE TABLET BY MOUTH EVERY DAY   . potassium chloride (K-DUR,KLOR-CON) 10 MEQ tablet TAKE ONE  TABLET BY MOUTH EVERY DAY   . rosuvastatin (CRESTOR) 10 MG tablet Take 1 tablet (10 mg total) by mouth at bedtime.   . sertraline (ZOLOFT) 100 MG tablet Take 1.5 tablets by mouth daily.   . isosorbide mononitrate (IMDUR) 30 MG 24 hr tablet Take 1 tablet (30 mg total) by mouth every morning.   . [DISCONTINUED] sertraline (ZOLOFT) 100 MG tablet TAKE ONE TABLET BY MOUTH EVERY DAY    No facility-administered encounter medications on file as of 03/02/2017.     No Known Allergies  Review of Systems  Constitutional: Negative.   Eyes: Negative.   Respiratory: Negative.   Cardiovascular: Positive for chest pain and palpitations (sometimes). Negative for leg swelling.  Gastrointestinal: Negative.   Musculoskeletal: Positive for joint pain (left shoulder and left arm pain off and on).  Skin: Negative.   Neurological: Negative for dizziness, tingling, tremors and speech change.  Endo/Heme/Allergies: Negative.   Psychiatric/Behavioral: Positive for depression (sometimes, off and on) and memory loss. The patient has insomnia. The patient is not nervous/anxious.     Objective:  BP (!) 148/62   Pulse 72   Temp 97.8 F (36.6 C)   Resp 14   Wt 138 lb 3.2 oz (62.7 kg)   LMP  (LMP Unknown)   BMI 26.99 kg/m   Physical Exam  Constitutional: She is oriented to person, place, and time and well-developed, well-nourished, and in no distress.  HENT:  Head: Normocephalic and atraumatic.  Eyes: Conjunctivae are normal. Pupils are equal, round, and reactive to light. No scleral icterus.  Neck: No thyromegaly present.  Cardiovascular: Normal rate, regular rhythm, normal heart sounds and intact distal pulses. Exam reveals no gallop.  No murmur heard. Pulmonary/Chest: Effort normal and breath sounds normal. No respiratory distress. She has no wheezes.  Abdominal: Soft.  Musculoskeletal: She exhibits no tenderness.  Neurological: She is alert and oriented to person, place, and time. Gait normal. GCS score  is 15.  Skin: Skin is warm and dry.  Psychiatric: Mood, affect and judgment normal.   Assessment and Plan :  1. Essential hypertension Still elevated. Increase Amlodipine 10 mg. Re check in 1 month. I think anxiety and depression may be contributing to this. - amLODipine (NORVASC) 10 MG tablet; Take 1 tablet (10 mg total) by mouth daily.  Dispense: 30 tablet; Refill: 12 - Comprehensive metabolic panel  2. Chest pain, unspecified type The same. Had work up with cardiologist and everything was stable. Follow as needed for now. Vague,possible GERD. 3. Mild cognitive impairment Seen neurologist. And has a follow up.  4. Salivation excessive Stable and follow as needed at this time.  5. Pure hypercholesterolemia Re check lab work since starting medication at this time. - Lipid Panel With LDL/HDL Ratio - Comprehensive metabolic panel  6. Hyperglycemia - HgB A1c  7. Anxiety Dr Manuella Ghazi increased Sertraline to 150 mg recently, will follow up  in 1 month. 8.GERD  HPI, Exam and A&P transcribed by Tiffany Kocher, RMA under direction and in the presence of Miguel Aschoff, MD. I have done the exam and reviewed the chart and it is accurate to the best of my knowledge. Development worker, community has been used and  any errors in dictation or transcription are unintentional. Miguel Aschoff M.D. [facility]

## 2017-03-03 DIAGNOSIS — R739 Hyperglycemia, unspecified: Secondary | ICD-10-CM | POA: Diagnosis not present

## 2017-03-03 DIAGNOSIS — I10 Essential (primary) hypertension: Secondary | ICD-10-CM | POA: Diagnosis not present

## 2017-03-03 DIAGNOSIS — E7800 Pure hypercholesterolemia, unspecified: Secondary | ICD-10-CM | POA: Diagnosis not present

## 2017-03-04 LAB — HEMOGLOBIN A1C
Est. average glucose Bld gHb Est-mCnc: 120 mg/dL
Hgb A1c MFr Bld: 5.8 % — ABNORMAL HIGH (ref 4.8–5.6)

## 2017-03-04 LAB — COMPREHENSIVE METABOLIC PANEL WITH GFR
ALT: 19 [IU]/L (ref 0–32)
AST: 22 [IU]/L (ref 0–40)
Albumin/Globulin Ratio: 1.7 (ref 1.2–2.2)
Albumin: 4.5 g/dL (ref 3.5–4.8)
Alkaline Phosphatase: 98 [IU]/L (ref 39–117)
BUN/Creatinine Ratio: 15 (ref 12–28)
BUN: 14 mg/dL (ref 8–27)
Bilirubin Total: 0.3 mg/dL (ref 0.0–1.2)
CO2: 25 mmol/L (ref 20–29)
Calcium: 9.2 mg/dL (ref 8.7–10.3)
Chloride: 105 mmol/L (ref 96–106)
Creatinine, Ser: 0.92 mg/dL (ref 0.57–1.00)
GFR calc Af Amer: 72 mL/min/{1.73_m2}
GFR calc non Af Amer: 62 mL/min/{1.73_m2}
Globulin, Total: 2.6 g/dL (ref 1.5–4.5)
Glucose: 97 mg/dL (ref 65–99)
Potassium: 5 mmol/L (ref 3.5–5.2)
Sodium: 144 mmol/L (ref 134–144)
Total Protein: 7.1 g/dL (ref 6.0–8.5)

## 2017-03-04 LAB — LIPID PANEL WITH LDL/HDL RATIO
Cholesterol, Total: 172 mg/dL (ref 100–199)
HDL: 73 mg/dL (ref >39)
LDL CALC: 87 mg/dL (ref 0–99)
LDL/HDL RATIO: 1.2 ratio (ref 0.0–3.2)
Triglycerides: 62 mg/dL (ref 0–149)
VLDL Cholesterol Cal: 12 mg/dL (ref 5–40)

## 2017-03-07 ENCOUNTER — Telehealth: Payer: Self-pay

## 2017-03-07 ENCOUNTER — Ambulatory Visit: Payer: Medicare Other | Admitting: Cardiovascular Disease

## 2017-03-07 NOTE — Telephone Encounter (Signed)
LMTCB  Thanks,  -Joseline 

## 2017-03-07 NOTE — Telephone Encounter (Signed)
-----   Message from Jerrol Banana., MD sent at 03/07/2017  2:02 PM EST ----- Stable.

## 2017-03-09 NOTE — Telephone Encounter (Signed)
Patient's husband Harolyn Rutherford, RMA

## 2017-03-23 DIAGNOSIS — H2513 Age-related nuclear cataract, bilateral: Secondary | ICD-10-CM | POA: Diagnosis not present

## 2017-03-28 ENCOUNTER — Other Ambulatory Visit: Payer: Self-pay | Admitting: Family Medicine

## 2017-03-28 NOTE — Telephone Encounter (Signed)
Pharmacy requesting refills. Thanks!  

## 2017-04-04 ENCOUNTER — Ambulatory Visit (INDEPENDENT_AMBULATORY_CARE_PROVIDER_SITE_OTHER): Payer: Medicare Other | Admitting: Family Medicine

## 2017-04-04 ENCOUNTER — Other Ambulatory Visit: Payer: Self-pay

## 2017-04-04 VITALS — BP 114/62 | HR 68 | Temp 98.1°F | Resp 14 | Wt 135.0 lb

## 2017-04-04 DIAGNOSIS — G3184 Mild cognitive impairment, so stated: Secondary | ICD-10-CM | POA: Diagnosis not present

## 2017-04-04 DIAGNOSIS — I10 Essential (primary) hypertension: Secondary | ICD-10-CM

## 2017-04-04 DIAGNOSIS — F419 Anxiety disorder, unspecified: Secondary | ICD-10-CM

## 2017-04-04 DIAGNOSIS — F320 Major depressive disorder, single episode, mild: Secondary | ICD-10-CM

## 2017-04-04 NOTE — Progress Notes (Signed)
Paige Baldwin  MRN: [id number] DOB: 1945/12/28  Subjective:  HPI   The patient is a 72 year old female who presents for follow up of her blood pressure.  She was last seen on 03/02/17 and her Amlodipine was increased.  She denies any adverse effects. She also had her Sertraline increased by Dr. Manuella Ghazi before the last visit and reports this is doing well.   Patient Active Problem List   Diagnosis Date Noted  . Anxiety 07/12/2014  . Clinical depression 07/12/2014  . Acid reflux 07/12/2014  . Autoimmune lymphocytic chronic thyroiditis 07/12/2014  . HLD (hyperlipidemia) 07/12/2014  . BP (high blood pressure) 07/12/2014  . Adult hypothyroidism 07/12/2014  . Mild major depression ([place]) 07/12/2014  . Arthritis, degenerative 07/12/2014  . Post menopausal syndrome 07/12/2014  . CMC arthritis, thumb, degenerative 06/25/2014    Past Medical History:  Diagnosis Date  . Depression   . GERD (gastroesophageal reflux disease)   . Hypertension   . Hypothyroidism     Social History   Socioeconomic History  . Marital status: Married    Spouse name: Not on file  . Number of children: Not on file  . Years of education: Not on file  . Highest education level: Not on file  Social Needs  . Financial resource strain: Not on file  . Food insecurity - worry: Not on file  . Food insecurity - inability: Not on file  . Transportation needs - medical: Not on file  . Transportation needs - non-medical: Not on file  Occupational History  . Not on file  Tobacco Use  . Smoking status: Never Smoker  . Smokeless tobacco: Never Used  Substance and Sexual Activity  . Alcohol use: No  . Drug use: No  . Sexual activity: Not on file  Other Topics Concern  . Not on file  Social History Narrative  . Not on file    Outpatient Encounter Medications as of 04/04/2017  Medication Sig Note  . amLODipine (NORVASC) 10 MG tablet Take 1 tablet (10 mg total) by mouth daily.   . COLOSTRUM PO Take by mouth.  02/11/2016: Received from: [place]: Take by mouth.  . isosorbide mononitrate (IMDUR) 30 MG 24 hr tablet Take 1 tablet (30 mg total) by mouth every morning.   Marland Kitchen levothyroxine (SYNTHROID, LEVOTHROID) 75 MCG tablet Take 1 tablet (75 mcg total) by mouth daily before breakfast.   . losartan (COZAAR) 50 MG tablet Take 1 tablet (50 mg total) by mouth daily.   . metoprolol (LOPRESSOR) 50 MG tablet TAKE ONE TABLET BY MOUTH EVERY DAY   . potassium chloride (K-DUR,KLOR-CON) 10 MEQ tablet TAKE ONE TABLET BY MOUTH EVERY DAY   . rosuvastatin (CRESTOR) 10 MG tablet Take 1 tablet (10 mg total) by mouth at bedtime.   . sertraline (ZOLOFT) 100 MG tablet Take 1.5 tablets by mouth daily.   . [DISCONTINUED] Digestive Enzymes (PAPAYA ENZYME PO) Take by mouth. 02/11/2016: Received from: [place]: Take by mouth.  . [DISCONTINUED] levothyroxine (SYNTHROID, LEVOTHROID) 150 MCG tablet TAKE ONE TABLET BY MOUTH EVERY DAY   . [DISCONTINUED] MAGNESIUM MALATE PO Use. 02/11/2016: Received from: [facility]  . [DISCONTINUED] Menaquinone-7 (VITAMIN K2 PO) Take by mouth. 02/11/2016: Received from: [place]: Take by mouth.   No facility-administered encounter medications on file as of 04/04/2017.     No Known Allergies  Review of Systems  Constitutional: Negative for  fever and malaise/fatigue.  Eyes: Negative.   Respiratory: Positive for cough. Negative for shortness of breath and wheezing.   Cardiovascular: Negative for chest pain, palpitations, claudication and leg swelling.  Gastrointestinal: Negative.   Skin: Negative.   Neurological: Negative for dizziness, weakness and headaches.  Endo/Heme/Allergies: Negative.   Psychiatric/Behavioral: Negative.     Objective:  BP 114/62 (BP Location: Right Arm, Patient Position: Sitting, Cuff Size: Normal)   Pulse 68   Temp 98.1 F (36.7 C) (Oral)   Resp 14    Wt 135 lb (61.2 kg)   LMP  (LMP Unknown)   BMI 26.37 kg/m   Physical Exam  Constitutional: She is oriented to person, place, and time and well-developed, well-nourished, and in no distress.  HENT:  Head: Normocephalic and atraumatic.  Eyes: Conjunctivae are normal. No scleral icterus.  Neck: No thyromegaly present.  Cardiovascular: Normal rate, regular rhythm and normal heart sounds.  Pulmonary/Chest: Effort normal and breath sounds normal.  Abdominal: Soft.  Neurological: She is alert and oriented to person, place, and time. Gait normal. GCS score is 15.  Skin: Skin is warm and dry.  Psychiatric: Mood, memory, affect and judgment normal.    Assessment and Plan :  HTN Mild Depression MCI All improved.RTC 6 months.  I have done the exam and reviewed the chart and it is accurate to the best of my knowledge. Development worker, community has been used and  any errors in dictation or transcription are unintentional. Miguel Aschoff M.D. [facility]

## 2017-05-17 ENCOUNTER — Other Ambulatory Visit: Payer: Self-pay | Admitting: Family Medicine

## 2017-06-07 ENCOUNTER — Other Ambulatory Visit: Payer: Self-pay | Admitting: Neurology

## 2017-06-07 DIAGNOSIS — G309 Alzheimer's disease, unspecified: Secondary | ICD-10-CM

## 2017-06-14 DIAGNOSIS — G309 Alzheimer's disease, unspecified: Secondary | ICD-10-CM | POA: Diagnosis not present

## 2017-06-14 DIAGNOSIS — F0150 Vascular dementia without behavioral disturbance: Secondary | ICD-10-CM | POA: Diagnosis not present

## 2017-06-14 DIAGNOSIS — F418 Other specified anxiety disorders: Secondary | ICD-10-CM | POA: Diagnosis not present

## 2017-06-14 DIAGNOSIS — F0280 Dementia in other diseases classified elsewhere without behavioral disturbance: Secondary | ICD-10-CM | POA: Diagnosis not present

## 2017-06-29 ENCOUNTER — Other Ambulatory Visit: Payer: Self-pay | Admitting: Neurology

## 2017-06-29 DIAGNOSIS — G309 Alzheimer's disease, unspecified: Secondary | ICD-10-CM

## 2017-07-19 ENCOUNTER — Ambulatory Visit
Admission: RE | Admit: 2017-07-19 | Discharge: 2017-07-19 | Disposition: A | Discharge status: Home / Self Care | Payer: No Typology Code available for payment source | Source: Ambulatory Visit | Attending: Neurology | Admitting: Neurology

## 2017-07-19 DIAGNOSIS — G309 Alzheimer's disease, unspecified: Secondary | ICD-10-CM

## 2017-09-15 DIAGNOSIS — F418 Other specified anxiety disorders: Secondary | ICD-10-CM | POA: Diagnosis not present

## 2017-09-15 DIAGNOSIS — F0150 Vascular dementia without behavioral disturbance: Secondary | ICD-10-CM | POA: Diagnosis not present

## 2017-09-15 DIAGNOSIS — F0280 Dementia in other diseases classified elsewhere without behavioral disturbance: Secondary | ICD-10-CM | POA: Diagnosis not present

## 2017-09-15 DIAGNOSIS — G309 Alzheimer's disease, unspecified: Secondary | ICD-10-CM | POA: Diagnosis not present

## 2017-10-03 ENCOUNTER — Ambulatory Visit (INDEPENDENT_AMBULATORY_CARE_PROVIDER_SITE_OTHER): Payer: Medicare Other | Admitting: Family Medicine

## 2017-10-03 ENCOUNTER — Ambulatory Visit: Payer: Self-pay | Admitting: Family Medicine

## 2017-10-03 ENCOUNTER — Ambulatory Visit (INDEPENDENT_AMBULATORY_CARE_PROVIDER_SITE_OTHER): Payer: Medicare Other

## 2017-10-03 ENCOUNTER — Other Ambulatory Visit: Payer: Self-pay | Admitting: Family Medicine

## 2017-10-03 ENCOUNTER — Encounter: Payer: Self-pay | Admitting: Family Medicine

## 2017-10-03 VITALS — BP 162/68 | HR 80 | Temp 98.1°F | Ht 59.0 in | Wt 127.0 lb

## 2017-10-03 VITALS — BP 160/60 | HR 80 | Temp 98.1°F | Ht 60.0 in | Wt 127.2 lb

## 2017-10-03 DIAGNOSIS — G3184 Mild cognitive impairment, so stated: Secondary | ICD-10-CM

## 2017-10-03 DIAGNOSIS — F320 Major depressive disorder, single episode, mild: Secondary | ICD-10-CM | POA: Diagnosis not present

## 2017-10-03 DIAGNOSIS — Z1231 Encounter for screening mammogram for malignant neoplasm of breast: Secondary | ICD-10-CM

## 2017-10-03 DIAGNOSIS — I10 Essential (primary) hypertension: Secondary | ICD-10-CM

## 2017-10-03 DIAGNOSIS — E039 Hypothyroidism, unspecified: Secondary | ICD-10-CM

## 2017-10-03 DIAGNOSIS — Z0000 Encounter for general adult medical examination without abnormal findings: Secondary | ICD-10-CM | POA: Diagnosis not present

## 2017-10-03 DIAGNOSIS — H269 Unspecified cataract: Secondary | ICD-10-CM | POA: Diagnosis not present

## 2017-10-03 MED ORDER — LOSARTAN POTASSIUM 100 MG PO TABS: 100.0000 mg | ORAL_TABLET | Freq: Every day | ORAL | 3 refills | Status: DC

## 2017-10-03 NOTE — Progress Notes (Signed)
Subjective:   Paige Baldwin is a 72 y.o. female who presents for Medicare Annual (Subsequent) preventive examination.  Review of Systems:  N/A  Cardiac Risk Factors include: advanced age (>60men, >12 women);hypertension;dyslipidemia     Objective:     Vitals: BP (!) 160/60 (BP Location: Right Arm)   Pulse 80   Temp 98.1 F (36.7 C) (Oral)   Ht 5' (1.524 m)   Wt 127 lb 3.2 oz (57.7 kg)   LMP  (LMP Unknown)   BMI 24.84 kg/m   Body mass index is 24.84 kg/m.  Advanced Directives 10/03/2017 06/29/2016 06/26/2015 06/25/2014 06/18/2014  Does Patient Have a Medical Advance Directive? Yes No No No No  Type of Paramedic of [place];Living will - - - -  Copy of [place] in Chart? No - copy requested - - - -  Would patient like information on creating a medical advance directive? No - Patient declined No - Patient declined - - No - patient declined information    Tobacco Social History   Tobacco Use  Smoking Status Never Smoker  Smokeless Tobacco Never Used     Counseling given: Not Answered   Clinical Intake:  Pre-visit preparation completed: Yes  Pain : No/denies pain Pain Score: 0-No pain     Nutritional Status: BMI of 19-24  Normal Nutritional Risks: None Diabetes: No  How often do you need to have someone help you when you read instructions, pamphlets, or other written materials from your doctor or pharmacy?: 1 - Never  Interpreter Needed?: No  Information entered by :: [facility], LPN  Past Medical History:  Diagnosis Date  . Dementia   . Depression   . GERD (gastroesophageal reflux disease)   . Hyperlipidemia   . Hypertension   . Hypothyroidism    Past Surgical History:  Procedure Laterality Date  . ABDOMINAL HYSTERECTOMY    . APPENDECTOMY     during cholecystectomy  . BACK SURGERY    . CHOLECYSTECTOMY     removed appendix at this time  . COLONOSCOPY WITH PROPOFOL N/A 06/30/2015   Procedure: COLONOSCOPY WITH  PROPOFOL;  Surgeon: Manya Silvas, MD;  Location: [facility] ENDOSCOPY;  Service: Endoscopy;  Laterality: N/A;tubular adenoma repeat 06/2020  . FINGER ARTHROPLASTY Right 06/25/2014   Procedure: FINGER ARTHROPLASTY;  Surgeon: Christophe Louis, MD;  Location: [facility] ORS;  Service: Orthopedics;  Laterality: Right;  . SALPINGOOPHORECTOMY     Family History  Problem Relation Age of Onset  . Hypertension Father   . Heart disease Father   . Diabetes Father   . Congestive Heart Failure Father   . Stroke Paternal Uncle   . Hypertension Brother   . CAD Brother   . Anemia Brother    Social History   Socioeconomic History  . Marital status: Married    Spouse name: Not on file  . Number of children: 2  . Years of education: Not on file  . Highest education level: Some college, no degree  Occupational History  . Occupation: retired  Scientific laboratory technician  . Financial resource strain: Not hard at all  . Food insecurity:    Worry: Never true    Inability: Never true  . Transportation needs:    Medical: No    Non-medical: No  Tobacco Use  . Smoking status: Never Smoker  . Smokeless tobacco: Never Used  Substance and Sexual Activity  . Alcohol use: No  . Drug use: No  . Sexual activity: Not  on file  Lifestyle  . Physical activity:    Days per week: Not on file    Minutes per session: Not on file  . Stress: Only a little  Relationships  . Social connections:    Talks on phone: Not on file    Gets together: Not on file    Attends religious service: Not on file    Active member of club or organization: Not on file    Attends meetings of clubs or organizations: Not on file    Relationship status: Not on file  Other Topics Concern  . Not on file  Social History Narrative  . Not on file    Outpatient Encounter Medications as of 10/03/2017  Medication Sig  . amLODipine (NORVASC) 10 MG tablet Take 1 tablet (10 mg total) by mouth daily.  . Cholecalciferol (VITAMIN D-3) 5000 units TABS Take by  mouth daily.  . COLOSTRUM PO Take by mouth daily.   Marland Kitchen donepezil (ARICEPT) 5 MG tablet TAKE 1 TABLET BY MOUTH EVERY DAY AT NIGHT  . isosorbide mononitrate (IMDUR) 30 MG 24 hr tablet Take 1 tablet (30 mg total) by mouth every morning.  Marland Kitchen levothyroxine (SYNTHROID, LEVOTHROID) 150 MCG tablet   . losartan (COZAAR) 50 MG tablet Take 1 tablet (50 mg total) by mouth daily.  . Magnesium Malate 1250 (141.7 Mg) MG TABS Take 0.5 tablets by mouth daily.   . metoprolol tartrate (LOPRESSOR) 50 MG tablet TAKE ONE TABLET BY MOUTH EVERY DAY  . NON FORMULARY brain elevate 1-2 capsules daily  . NON FORMULARY Total Restore - 1 capsule daily  . omeprazole (PRILOSEC) 40 MG capsule Take 40 mg by mouth daily.  Marland Kitchen PAPAYA ENZYME PO Take 1 tablet by mouth 3 (three) times daily.   . potassium chloride (K-DUR,KLOR-CON) 10 MEQ tablet TAKE ONE TABLET BY MOUTH EVERY DAY  . Probiotic Product (PROBIOTIC-10 PO) Take 1 capsule by mouth daily.  . rosuvastatin (CRESTOR) 10 MG tablet Take 1 tablet (10 mg total) by mouth at bedtime.  . sertraline (ZOLOFT) 100 MG tablet Take 100 mg by mouth daily.   Marland Kitchen levothyroxine (SYNTHROID, LEVOTHROID) 75 MCG tablet Take 1 tablet (75 mcg total) by mouth daily before breakfast. (Patient not taking: Reported on 10/03/2017)   No facility-administered encounter medications on file as of 10/03/2017.     Activities of Daily Living In your present state of health, do you have any difficulty performing the following activities: 10/03/2017  Hearing? N  Vision? N  Difficulty concentrating or making decisions? Y  Comment On Aricept for dementia.   Walking or climbing stairs? N  Dressing or bathing? N  Doing errands, shopping? N  Preparing Food and eating ? N  Using the Toilet? N  Do you have problems with loss of bowel control? N  Managing your Medications? N  Managing your Finances? N  Housekeeping or managing your Housekeeping? N  Some recent data might be hidden    Patient Care Team: Jerrol Banana., MD as PCP - General (Family Medicine) Vladimir Crofts, MD as Consulting Physician (Neurology)    Assessment:   This is a routine wellness examination for [place].  Exercise Activities and Dietary recommendations Current Exercise Habits: Home exercise routine, Type of exercise: walking, Time (Minutes): 30, Frequency (Times/Week): 1(to 2 times a week ), Weekly Exercise (Minutes/Week): 30, Intensity: Mild, Exercise limited by: None identified  Goals    . Increase water intake     Recommend increasing water intake to  3-4 glasses a day.       Fall Risk Fall Risk  10/03/2017 06/29/2016 06/26/2015  Falls in the past year? No No No   Is the patient's home free of loose throw rugs in walkways, pet beds, electrical cords, etc?   yes      Grab bars in the bathroom? no      Handrails on the stairs?   no      Adequate lighting?   yes  Timed Get Up and Go performed: N/A  Depression Screen PHQ 2/9 Scores 10/03/2017 10/03/2017 06/29/2016 06/29/2016  PHQ - 2 Score 0 0 0 0  PHQ- 9 Score 3 - 0 -     Cognitive Function: Pt declined screening today. Pt has been tested several times recently by Dr Manuella Ghazi.      6CIT Screen 06/29/2016  What Year? 0 points  What month? 0 points  What time? 0 points  Count back from 20 0 points  Months in reverse 0 points  Repeat phrase 2 points  Total Score 2    Immunization History  Administered Date(s) Administered  . Influenza, High Dose Seasonal PF 01/04/2015, 01/09/2016, 10/18/2016  . Pneumococcal Conjugate-13 06/26/2015  . Pneumococcal Polysaccharide-23 06/29/2016  . Tdap 10/17/2006    Qualifies for Shingles Vaccine? Due for Shingles vaccine. Declined my offer to administer today. Education has been provided regarding the importance of this vaccine. Pt has been advised to call her insurance company to determine her out of pocket expense. Advised she may also receive this vaccine at her local pharmacy or Health Dept. Verbalized acceptance and  understanding.  Screening Tests Health Maintenance  Topic Date Due  . MAMMOGRAM  03/29/2016  . TETANUS/TDAP  10/16/2016  . INFLUENZA VACCINE  09/22/2017  . COLONOSCOPY  06/29/2025  . DEXA SCAN  Completed  . Hepatitis C Screening  Completed  . PNA vac Low Risk Adult  Completed    Cancer Screenings: Lung: Low Dose CT Chest recommended if Age 16-80 years, 30 pack-year currently smoking OR have quit w/in 15years. Patient does not qualify. Breast:  Up to date on Mammogram? No, pt to schedule mammogram this year.  Up to date of Bone Density/Dexa? Yes Colorectal: Up to date  Additional Screenings:  Hepatitis C Screening: Up to date     Plan:  I have personally reviewed and addressed the Medicare Annual Wellness questionnaire and have noted the following in the patient's chart:  A. Medical and social history B. Use of alcohol, tobacco or illicit drugs  C. Current medications and supplements D. Functional ability and status E.  Nutritional status F.  Physical activity G. Advance directives H. List of other physicians I.  Hospitalizations, surgeries, and ER visits in previous 12 months J.  [place] such as hearing and vision if needed, cognitive and depression L. Referrals and appointments - none  In addition, I have reviewed and discussed with patient certain preventive protocols, quality metrics, and best practice recommendations. A written personalized care plan for preventive services as well as general preventive health recommendations were provided to patient.  See attached scanned questionnaire for additional information.   Signed,  Fabio Neighbors, LPN Nurse Health Advisor   Nurse Recommendations: Pt to set up a mammogram this year. Declined the tetanus vaccine today.

## 2017-10-03 NOTE — Patient Instructions (Addendum)
Paige Baldwin , Thank you for taking time to come for your Medicare Wellness Visit. I appreciate your ongoing commitment to your health goals. Please review the following plan we discussed and let me know if I can assist you in the future.   Screening recommendations/referrals: Colonoscopy: Up to date Mammogram: Up to date Bone Density: Up to date Recommended yearly ophthalmology/optometry visit for glaucoma screening and checkup Recommended yearly dental visit for hygiene and checkup  Vaccinations: Influenza vaccine: Up to date Pneumococcal vaccine: Up to date Tdap vaccine: Pt declines today.  Shingles vaccine: Pt declines today.     Advanced directives: Please bring a copy of your POA (Power of Attorney) and/or Living Will to your next appointment.   Conditions/risks identified: Recommend to continue working on increasing water intake to 4 or more glasses a day.   Next appointment: 2:00 PM today with Dr Rosanna Randy.    Preventive Care 73 Years and Older, Female Preventive care refers to lifestyle choices and visits with your health care provider that can promote health and wellness. What does preventive care include?  A yearly physical exam. This is also called an annual well check.  Dental exams once or twice a year.  Routine eye exams. Ask your health care provider how often you should have your eyes checked.  Personal lifestyle choices, including:  Daily care of your teeth and gums.  Regular physical activity.  Eating a healthy diet.  Avoiding tobacco and drug use.  Limiting alcohol use.  Practicing safe sex.  Taking low-dose aspirin every day.  Taking vitamin and mineral supplements as recommended by your health care provider. What happens during an annual well check? The services and screenings done by your health care provider during your annual well check will depend on your age, overall health, lifestyle risk factors, and family history of disease. Counseling    Your health care provider may ask you questions about your:  Alcohol use.  Tobacco use.  Drug use.  Emotional well-being.  Home and relationship well-being.  Sexual activity.  Eating habits.  History of falls.  Memory and ability to understand (cognition).  Work and work Statistician.  Reproductive health. Screening  You may have the following tests or measurements:  Height, weight, and BMI.  Blood pressure.  Lipid and cholesterol levels. These may be checked every 5 years, or more frequently if you are over 74 years old.  Skin check.  Lung cancer screening. You may have this screening every year starting at age 54 if you have a 30-pack-year history of smoking and currently smoke or have quit within the past 15 years.  Fecal occult blood test (FOBT) of the stool. You may have this test every year starting at age 58.  Flexible sigmoidoscopy or colonoscopy. You may have a sigmoidoscopy every 5 years or a colonoscopy every 10 years starting at age 6.  Hepatitis C blood test.  Hepatitis B blood test.  Sexually transmitted disease (STD) testing.  Diabetes screening. This is done by checking your blood sugar (glucose) after you have not eaten for a while (fasting). You may have this done every 1-3 years.  Bone density scan. This is done to screen for osteoporosis. You may have this done starting at age 55.  Mammogram. This may be done every 1-2 years. Talk to your health care provider about how often you should have regular mammograms. Talk with your health care provider about your test results, treatment options, and if necessary, the need for more tests.  Vaccines  Your health care provider may recommend certain vaccines, such as:  Influenza vaccine. This is recommended every year.  Tetanus, diphtheria, and acellular pertussis (Tdap, Td) vaccine. You may need a Td booster every 10 years.  Zoster vaccine. You may need this after age 77.  Pneumococcal 13-valent  conjugate (PCV13) vaccine. One dose is recommended after age 75.  Pneumococcal polysaccharide (PPSV23) vaccine. One dose is recommended after age 28. Talk to your health care provider about which screenings and vaccines you need and how often you need them. This information is not intended to replace advice given to you by your health care provider. Make sure you discuss any questions you have with your health care provider. Document Released: 03/07/2015 Document Revised: 10/29/2015 Document Reviewed: 12/10/2014 Elsevier Interactive Patient Education  2017 [place] Prevention in the Home Falls can cause injuries. They can happen to people of all ages. There are many things you can do to make your home safe and to help prevent falls. What can I do on the outside of my home?  Regularly fix the edges of walkways and driveways and fix any cracks.  Remove anything that might make you trip as you walk through a door, such as a raised step or threshold.  Trim any bushes or trees on the path to your home.  Use bright outdoor lighting.  Clear any walking paths of anything that might make someone trip, such as rocks or tools.  Regularly check to see if handrails are loose or broken. Make sure that both sides of any steps have handrails.  Any raised decks and porches should have guardrails on the edges.  Have any leaves, snow, or ice cleared regularly.  Use sand or salt on walking paths during winter.  Clean up any spills in your garage right away. This includes oil or grease spills. What can I do in the bathroom?  Use night lights.  Install grab bars by the toilet and in the tub and shower. Do not use towel bars as grab bars.  Use non-skid mats or decals in the tub or shower.  If you need to sit down in the shower, use a plastic, non-slip stool.  Keep the floor dry. Clean up any water that spills on the floor as soon as it happens.  Remove soap buildup in the tub or  shower regularly.  Attach bath mats securely with double-sided non-slip rug tape.  Do not have throw rugs and other things on the floor that can make you trip. What can I do in the bedroom?  Use night lights.  Make sure that you have a light by your bed that is easy to reach.  Do not use any sheets or blankets that are too big for your bed. They should not hang down onto the floor.  Have a firm chair that has side arms. You can use this for support while you get dressed.  Do not have throw rugs and other things on the floor that can make you trip. What can I do in the kitchen?  Clean up any spills right away.  Avoid walking on wet floors.  Keep items that you use a lot in easy-to-reach places.  If you need to reach something above you, use a strong step stool that has a grab bar.  Keep electrical cords out of the way.  Do not use floor polish or wax that makes floors slippery. If you must use wax, use non-skid floor wax.  Do not have throw rugs and other things on the floor that can make you trip. What can I do with my stairs?  Do not leave any items on the stairs.  Make sure that there are handrails on both sides of the stairs and use them. Fix handrails that are broken or loose. Make sure that handrails are as long as the stairways.  Check any carpeting to make sure that it is firmly attached to the stairs. Fix any carpet that is loose or worn.  Avoid having throw rugs at the top or bottom of the stairs. If you do have throw rugs, attach them to the floor with carpet tape.  Make sure that you have a light switch at the top of the stairs and the bottom of the stairs. If you do not have them, ask someone to add them for you. What else can I do to help prevent falls?  Wear shoes that:  Do not have high heels.  Have rubber bottoms.  Are comfortable and fit you well.  Are closed at the toe. Do not wear sandals.  If you use a stepladder:  Make sure that it is fully  opened. Do not climb a closed stepladder.  Make sure that both sides of the stepladder are locked into place.  Ask someone to hold it for you, if possible.  Clearly mark and make sure that you can see:  Any grab bars or handrails.  First and last steps.  Where the edge of each step is.  Use tools that help you move around (mobility aids) if they are needed. These include:  Canes.  Walkers.  Scooters.  Crutches.  Turn on the lights when you go into a dark area. Replace any light bulbs as soon as they burn out.  Set up your furniture so you have a clear path. Avoid moving your furniture around.  If any of your floors are uneven, fix them.  If there are any pets around you, be aware of where they are.  Review your medicines with your doctor. Some medicines can make you feel dizzy. This can increase your chance of falling. Ask your doctor what other things that you can do to help prevent falls. This information is not intended to replace advice given to you by your health care provider. Make sure you discuss any questions you have with your health care provider. Document Released: 12/05/2008 Document Revised: 07/17/2015 Document Reviewed: 03/15/2014 Elsevier Interactive Patient Education  2017 [employer].

## 2017-10-03 NOTE — Progress Notes (Signed)
Patient: Paige Baldwin, Female    DOB: 09/17/1945, 72 y.o.   MRN: [id number] Visit Date: 10/03/2017  Today's Provider: [name], MD   Chief Complaint  Patient presents with  . Medicare Wellness   Subjective:     Recheck of chronic problems Paige Baldwin is a 72 y.o. female. She feels well. She reports exercising regularly. She reports she is sleeping well.  -----------------------------------------------------------   Review of Systems  Constitutional: Positive for fatigue. Negative for activity change, appetite change, chills, diaphoresis, fever and unexpected weight change.  HENT: Negative.   Eyes: Negative.   Respiratory: Negative.   Cardiovascular: Negative.   Gastrointestinal: Negative.   Endocrine: Negative.   Genitourinary: Negative.   Musculoskeletal: Negative.   Skin: Negative.   Allergic/Immunologic: Negative.   Neurological: Negative.   Hematological: Negative.   Psychiatric/Behavioral: Negative.     Social History   Socioeconomic History  . Marital status: Married    Spouse name: Not on file  . Number of children: 2  . Years of education: Not on file  . Highest education level: Some college, no degree  Occupational History  . Occupation: retired  Scientific laboratory technician  . Financial resource strain: Not hard at all  . Food insecurity:    Worry: Never true    Inability: Never true  . Transportation needs:    Medical: No    Non-medical: No  Tobacco Use  . Smoking status: Never Smoker  . Smokeless tobacco: Never Used  Substance and Sexual Activity  . Alcohol use: No  . Drug use: No  . Sexual activity: Not on file  Lifestyle  . Physical activity:    Days per week: Not on file    Minutes per session: Not on file  . Stress: Only a little  Relationships  . Social connections:    Talks on phone: Not on file    Gets together: Not on file    Attends religious service: Not on file    Active member of club or organization: Not on file   Attends meetings of clubs or organizations: Not on file    Relationship status: Not on file  . Intimate partner violence:    Fear of current or ex partner: Not on file    Emotionally abused: Not on file    Physically abused: Not on file    Forced sexual activity: Not on file  Other Topics Concern  . Not on file  Social History Narrative  . Not on file    Past Medical History:  Diagnosis Date  . Dementia   . Depression   . GERD (gastroesophageal reflux disease)   . Hyperlipidemia   . Hypertension   . Hypothyroidism      Patient Active Problem List   Diagnosis Date Noted  . Anxiety 07/12/2014  . Clinical depression 07/12/2014  . Acid reflux 07/12/2014  . Autoimmune lymphocytic chronic thyroiditis 07/12/2014  . HLD (hyperlipidemia) 07/12/2014  . BP (high blood pressure) 07/12/2014  . Adult hypothyroidism 07/12/2014  . Mild major depression ([place]) 07/12/2014  . Arthritis, degenerative 07/12/2014  . Post menopausal syndrome 07/12/2014  . CMC arthritis, thumb, degenerative 06/25/2014    Past Surgical History:  Procedure Laterality Date  . ABDOMINAL HYSTERECTOMY    . APPENDECTOMY     during cholecystectomy  . BACK SURGERY    . CHOLECYSTECTOMY     removed appendix at this time  . COLONOSCOPY WITH PROPOFOL N/A 06/30/2015  Procedure: COLONOSCOPY WITH PROPOFOL;  Surgeon: Manya Silvas, MD;  Location: [facility] ENDOSCOPY;  Service: Endoscopy;  Laterality: N/A;tubular adenoma repeat 06/2020  . FINGER ARTHROPLASTY Right 06/25/2014   Procedure: FINGER ARTHROPLASTY;  Surgeon: Christophe Louis, MD;  Location: [facility] ORS;  Service: Orthopedics;  Laterality: Right;  . SALPINGOOPHORECTOMY      Her family history includes Anemia in her brother; CAD in her brother; Congestive Heart Failure in her father; Diabetes in her father; Heart disease in her father; Hypertension in her brother and father; Stroke in her paternal uncle.      Current Outpatient Medications:  .  amLODipine (NORVASC) 10  MG tablet, Take 1 tablet (10 mg total) by mouth daily., Disp: 30 tablet, Rfl: 12 .  Cholecalciferol (VITAMIN D-3) 5000 units TABS, Take by mouth daily., Disp: , Rfl:  .  COLOSTRUM PO, Take by mouth daily. , Disp: , Rfl:  .  donepezil (ARICEPT) 5 MG tablet, TAKE 1 TABLET BY MOUTH EVERY DAY AT NIGHT, Disp: , Rfl: 3 .  isosorbide mononitrate (IMDUR) 30 MG 24 hr tablet, Take 1 tablet (30 mg total) by mouth every morning., Disp: 30 tablet, Rfl: 12 .  levothyroxine (SYNTHROID, LEVOTHROID) 150 MCG tablet, , Disp: , Rfl:  .  levothyroxine (SYNTHROID, LEVOTHROID) 75 MCG tablet, Take 1 tablet (75 mcg total) by mouth daily before breakfast., Disp: 30 tablet, Rfl: 5 .  losartan (COZAAR) 50 MG tablet, Take 1 tablet (50 mg total) by mouth daily., Disp: 90 tablet, Rfl: 3 .  Magnesium Malate 1250 (141.7 Mg) MG TABS, Take 0.5 tablets by mouth daily. , Disp: , Rfl:  .  metoprolol tartrate (LOPRESSOR) 50 MG tablet, TAKE ONE TABLET BY MOUTH EVERY DAY, Disp: 30 tablet, Rfl: 12 .  NON FORMULARY, brain elevate 1-2 capsules daily, Disp: , Rfl:  .  NON FORMULARY, Total Restore - 1 capsule daily, Disp: , Rfl:  .  omeprazole (PRILOSEC) 40 MG capsule, Take 40 mg by mouth daily., Disp: , Rfl:  .  PAPAYA ENZYME PO, Take 1 tablet by mouth 3 (three) times daily. , Disp: , Rfl:  .  potassium chloride (K-DUR,KLOR-CON) 10 MEQ tablet, TAKE ONE TABLET BY MOUTH EVERY DAY, Disp: 30 tablet, Rfl: 12 .  Probiotic Product (PROBIOTIC-10 PO), Take 1 capsule by mouth daily., Disp: , Rfl:  .  rosuvastatin (CRESTOR) 10 MG tablet, Take 1 tablet (10 mg total) by mouth at bedtime., Disp: 30 tablet, Rfl: 12 .  sertraline (ZOLOFT) 100 MG tablet, Take 100 mg by mouth daily. , Disp: , Rfl:   Patient Care Team: Jerrol Banana., MD as PCP - General (Family Medicine) Vladimir Crofts, MD as Consulting Physician (Neurology)     Objective:   Vitals: BP (!) 162/68   Pulse 80   Temp 98.1 F (36.7 C)   Ht 4\' 11"  (1.499 m)   Wt 127 lb (57.6  kg)   LMP  (LMP Unknown)   BMI 25.65 kg/m   Physical Exam  Constitutional: She is oriented to person, place, and time. She appears well-developed and well-nourished.  HENT:  Head: Normocephalic and atraumatic.  Right Ear: External ear normal.  Left Ear: External ear normal.  Nose: Nose normal.  Mouth/Throat: Oropharynx is clear and moist.  Eyes: Conjunctivae are normal. No scleral icterus.  Neck: No thyromegaly present.  Cardiovascular: Normal rate, regular rhythm, normal heart sounds and intact distal pulses.  Pulmonary/Chest: Effort normal and breath sounds normal.  Abdominal: Soft.  Musculoskeletal: She exhibits no edema.  Neurological: She is alert and oriented to person, place, and time.  Skin: Skin is warm and dry.  Psychiatric: She has a normal mood and affect. Her behavior is normal. Judgment and thought content normal.    Activities of Daily Living In your present state of health, do you have any difficulty performing the following activities: 10/03/2017  Hearing? N  Vision? N  Difficulty concentrating or making decisions? Y  Comment On Aricept for dementia.   Walking or climbing stairs? N  Dressing or bathing? N  Doing errands, shopping? N  Preparing Food and eating ? N  Using the Toilet? N  Do you have problems with loss of bowel control? N  Managing your Medications? N  Managing your Finances? N  Housekeeping or managing your Housekeeping? N  Some recent data might be hidden    Fall Risk Assessment Fall Risk  10/03/2017 06/29/2016 06/26/2015  Falls in the past year? No No No     Depression Screen PHQ 2/9 Scores 10/03/2017 10/03/2017 06/29/2016 06/29/2016  PHQ - 2 Score 0 0 0 0  PHQ- 9 Score 3 - 0 -      Assessment & Plan:    Reviewed patient's Family Medical History Reviewed and updated list of patient's medical providers Assessment of cognitive impairment was done Assessed patient's functional ability Established a written schedule for health screening  [place] Completed and Reviewed  Exercise Activities and Dietary recommendations Goals    . Increase water intake     Recommend increasing water intake to 3-4 glasses a day.       Immunization History  Administered Date(s) Administered  . Influenza, High Dose Seasonal PF 01/04/2015, 01/09/2016, 10/18/2016  . Pneumococcal Conjugate-13 06/26/2015  . Pneumococcal Polysaccharide-23 06/29/2016  . Tdap 10/17/2006    Health Maintenance  Topic Date Due  . MAMMOGRAM  03/29/2016  . TETANUS/TDAP  10/16/2016  . INFLUENZA VACCINE  09/22/2017  . COLONOSCOPY  06/29/2025  . DEXA SCAN  Completed  . Hepatitis C Screening  Completed  . PNA vac Low Risk Adult  Completed     Discussed health benefits of physical activity, and encouraged her to engage in regular exercise appropriate for her age and condition.  HTN Increase losatan to 100mg  daily.RTC 1 month. Hypothyroid HLD MCI Mild Depression All stable.   ------------------------------------------------------------------------------------------------------------   I have done the exam and reviewed the above chart and it is accurate to the best of my knowledge. Development worker, community has been used in this note in any air is in the dictation or transcription are unintentional.  [name], MD  [place]

## 2017-10-04 ENCOUNTER — Telehealth: Payer: Self-pay

## 2017-10-04 ENCOUNTER — Telehealth: Payer: Self-pay | Admitting: Family Medicine

## 2017-10-04 DIAGNOSIS — E039 Hypothyroidism, unspecified: Secondary | ICD-10-CM

## 2017-10-04 LAB — TSH: TSH: <0.006 u[IU]/mL — ABNORMAL LOW (ref 0.450–4.500)

## 2017-10-04 MED ORDER — LEVOTHYROXINE SODIUM 25 MCG PO TABS: 25.0000 ug | ORAL_TABLET | Freq: Every day | ORAL | 11 refills | Status: DC

## 2017-10-04 NOTE — Telephone Encounter (Signed)
Advised patient of results. Medication was sent into the pharmacy.  

## 2017-10-04 NOTE — Telephone Encounter (Signed)
-----   Message from Jerrol Banana., MD sent at 10/04/2017  8:34 AM EDT ----- Cut synthroid from 75 to 25 mcg daily.

## 2017-10-04 NOTE — Telephone Encounter (Signed)
Spoke with daughter and she reports that the patient was previously taking levothyroxine 139mcg. Do you still want her to take 50mcg?   We were thinking that the patient was on 34mcg previously. I have already sent in 22mcg and the patient's husband has already picked up Rx from the pharmacy. Please advise. Thanks!

## 2017-10-04 NOTE — Telephone Encounter (Signed)
Please call pt's daughter and clarify medication instructions.  She was there when you called her mom but did not understand if she will be taking one or two different medicaitons  Daughters CB # is [phone]  Thanks Con Memos

## 2017-10-05 NOTE — Telephone Encounter (Signed)
Left message to call back on daughter cell.

## 2017-10-05 NOTE — Telephone Encounter (Signed)
If 150 decrease to 100--thanks

## 2017-10-05 NOTE — Telephone Encounter (Signed)
Daughter return call wants to talk with Rachelle.

## 2017-10-06 NOTE — Telephone Encounter (Signed)
Tried calling daughter back and VMbox was full. Will try again later.

## 2017-10-10 MED ORDER — LEVOTHYROXINE SODIUM 100 MCG PO TABS: 100.0000 ug | ORAL_TABLET | Freq: Every day | ORAL | 11 refills | Status: DC

## 2017-10-10 MED ORDER — PANTOPRAZOLE SODIUM 40 MG PO TBEC: 40.0000 mg | DELAYED_RELEASE_TABLET | Freq: Every day | ORAL | 3 refills | Status: DC

## 2017-10-10 NOTE — Telephone Encounter (Signed)
Spoke with husband and advised as below. Sent in levothyroxine 135mcg into the pharmacy. Patient is to not take 33mcg.  He also mentions that omeprazole and tums is not helping the patient's GERD symptoms. Per Dr. Rosanna Randy, patient is to take pantoprazole 40mg  and F/U in 2-3 weeks to see if this helps.

## 2017-10-20 ENCOUNTER — Ambulatory Visit
Admission: RE | Admit: 2017-10-20 | Discharge: 2017-10-20 | Disposition: A | Discharge status: Home / Self Care | Payer: Medicare Other | Source: Ambulatory Visit | Attending: Family Medicine | Admitting: Family Medicine

## 2017-10-20 DIAGNOSIS — Z1231 Encounter for screening mammogram for malignant neoplasm of breast: Secondary | ICD-10-CM | POA: Diagnosis not present

## 2017-10-28 ENCOUNTER — Other Ambulatory Visit: Payer: Self-pay | Admitting: *Deleted

## 2017-10-28 ENCOUNTER — Inpatient Hospital Stay
Admission: RE | Admit: 2017-10-28 | Discharge: 2017-10-28 | Disposition: A | Discharge status: Home / Self Care | Payer: Self-pay | Source: Ambulatory Visit | Attending: *Deleted | Admitting: *Deleted

## 2017-10-28 DIAGNOSIS — Z9289 Personal history of other medical treatment: Secondary | ICD-10-CM

## 2017-11-01 DIAGNOSIS — Z23 Encounter for immunization: Secondary | ICD-10-CM | POA: Diagnosis not present

## 2017-11-25 ENCOUNTER — Telehealth: Payer: Self-pay | Admitting: Family Medicine

## 2017-11-25 NOTE — Telephone Encounter (Signed)
Copied from [place] [id number]. Topic: Referral - Status >> Nov 24, 2017 10:18 AM Parke Poisson wrote: Reason for CRM: Pt cancelled appointment at [facility] for cataract

## 2017-11-25 NOTE — Telephone Encounter (Signed)
Please review. Thanks!  

## 2018-01-10 ENCOUNTER — Ambulatory Visit (INDEPENDENT_AMBULATORY_CARE_PROVIDER_SITE_OTHER): Payer: Medicare Other | Admitting: Family Medicine

## 2018-01-10 ENCOUNTER — Ambulatory Visit
Admission: RE | Admit: 2018-01-10 | Discharge: 2018-01-10 | Disposition: A | Discharge status: Home / Self Care | Payer: Medicare Other | Source: Ambulatory Visit | Attending: Family Medicine | Admitting: Family Medicine

## 2018-01-10 VITALS — BP 152/70 | HR 73 | Temp 97.4°F | Resp 16 | Wt 134.0 lb

## 2018-01-10 DIAGNOSIS — M19042 Primary osteoarthritis, left hand: Secondary | ICD-10-CM | POA: Diagnosis not present

## 2018-01-10 DIAGNOSIS — F325 Major depressive disorder, single episode, in full remission: Secondary | ICD-10-CM

## 2018-01-10 DIAGNOSIS — I10 Essential (primary) hypertension: Secondary | ICD-10-CM | POA: Diagnosis not present

## 2018-01-10 DIAGNOSIS — M069 Rheumatoid arthritis, unspecified: Secondary | ICD-10-CM

## 2018-01-10 DIAGNOSIS — E039 Hypothyroidism, unspecified: Secondary | ICD-10-CM | POA: Diagnosis not present

## 2018-01-10 DIAGNOSIS — G3184 Mild cognitive impairment, so stated: Secondary | ICD-10-CM | POA: Diagnosis not present

## 2018-01-10 MED ORDER — MELOXICAM 7.5 MG PO TABS: 7.5000 mg | ORAL_TABLET | Freq: Every day | ORAL | 0 refills | Status: DC

## 2018-01-10 NOTE — Progress Notes (Signed)
Paige Baldwin  MRN: [id number] DOB: [birth year]  Subjective:  HPI   The patient is a 72 year old female who presents for follow up of chronic issues.  She was last seen on 10/03/17.    Hypertension-At the time of her last visit she was instructed to increase her Losartan to 100 mg daily. BP Readings from Last 3 Encounters:  01/10/18 (!) 152/70  10/03/17 (!) 162/68  10/03/17 (!) 160/60   Depression Depression screen [facility] 2/9 01/10/2018 10/03/2017 10/03/2017 06/29/2016 06/29/2016  Decreased Interest 0 0 0 0 0  Down, Depressed, Hopeless 0 0 0 0 0  PHQ - 2 Score 0 0 0 0 0  Altered sleeping 0 0 - 0 -  Tired, decreased energy 0 3 - 0 -  Change in appetite 0 0 - 0 -  Feeling bad or failure about yourself  0 0 - 0 -  Trouble concentrating 0 0 - 0 -  Moving slowly or fidgety/restless 0 0 - 0 -  Suicidal thoughts 0 0 - 0 -  PHQ-9 Score 0 3 - 0 -  Difficult doing work/chores Not difficult at all Not difficult at all - - -      Patient Active Problem List   Diagnosis Date Noted  . Anxiety 07/12/2014  . Clinical depression 07/12/2014  . Acid reflux 07/12/2014  . Autoimmune lymphocytic chronic thyroiditis 07/12/2014  . HLD (hyperlipidemia) 07/12/2014  . BP (high blood pressure) 07/12/2014  . Adult hypothyroidism 07/12/2014  . Mild major depression ([place]) 07/12/2014  . Arthritis, degenerative 07/12/2014  . Post menopausal syndrome 07/12/2014  . CMC arthritis, thumb, degenerative 06/25/2014    Past Medical History:  Diagnosis Date  . Dementia   . Depression   . GERD (gastroesophageal reflux disease)   . Hyperlipidemia   . Hypertension   . Hypothyroidism     Social History   Socioeconomic History  . Marital status: Married    Spouse name: Not on file  . Number of children: 2  . Years of education: Not on file  . Highest education level: Some college, no degree  Occupational History  . Occupation: retired  Scientific laboratory technician  . Financial resource strain: Not hard at all  . Food  insecurity:    Worry: Never true    Inability: Never true  . Transportation needs:    Medical: No    Non-medical: No  Tobacco Use  . Smoking status: Never Smoker  . Smokeless tobacco: Never Used  Substance and Sexual Activity  . Alcohol use: No  . Drug use: No  . Sexual activity: Not on file  Lifestyle  . Physical activity:    Days per week: Not on file    Minutes per session: Not on file  . Stress: Only a little  Relationships  . Social connections:    Talks on phone: Not on file    Gets together: Not on file    Attends religious service: Not on file    Active member of club or organization: Not on file    Attends meetings of clubs or organizations: Not on file    Relationship status: Not on file  . Intimate partner violence:    Fear of current or ex partner: Not on file    Emotionally abused: Not on file    Physically abused: Not on file    Forced sexual activity: Not on file  Other Topics Concern  . Not on file  Social History Narrative  .  Not on file    Outpatient Encounter Medications as of 01/10/2018  Medication Sig Note  . amLODipine (NORVASC) 10 MG tablet Take 1 tablet (10 mg total) by mouth daily.   . Cholecalciferol (VITAMIN D-3) 5000 units TABS Take by mouth daily.   . COLOSTRUM PO Take by mouth daily.  02/11/2016: Received from: [place]: Take by mouth.  . donepezil (ARICEPT) 5 MG tablet TAKE 1 TABLET BY MOUTH EVERY DAY AT NIGHT   . isosorbide mononitrate (IMDUR) 30 MG 24 hr tablet Take 1 tablet (30 mg total) by mouth every morning.   Marland Kitchen levothyroxine (SYNTHROID, LEVOTHROID) 25 MCG tablet Take 1 tablet (25 mcg total) by mouth daily before breakfast.   . losartan (COZAAR) 100 MG tablet Take 1 tablet (100 mg total) by mouth daily.   . Magnesium Malate 1250 (141.7 Mg) MG TABS Take 0.5 tablets by mouth daily.    . metoprolol tartrate (LOPRESSOR) 50 MG tablet TAKE ONE TABLET BY MOUTH EVERY DAY   . NON FORMULARY brain elevate 1-2  capsules daily   . NON FORMULARY Total Restore - 1 capsule daily   . omeprazole (PRILOSEC) 40 MG capsule Take 40 mg by mouth daily.   . pantoprazole (PROTONIX) 40 MG tablet Take 1 tablet (40 mg total) by mouth daily.   Marland Kitchen PAPAYA ENZYME PO Take 1 tablet by mouth 3 (three) times daily.    . potassium chloride (K-DUR,KLOR-CON) 10 MEQ tablet TAKE ONE TABLET BY MOUTH EVERY DAY   . Probiotic Product (PROBIOTIC-10 PO) Take 1 capsule by mouth daily.   . rosuvastatin (CRESTOR) 10 MG tablet Take 1 tablet (10 mg total) by mouth at bedtime.   . sertraline (ZOLOFT) 100 MG tablet Take 100 mg by mouth daily.    . [DISCONTINUED] levothyroxine (SYNTHROID, LEVOTHROID) 100 MCG tablet Take 1 tablet (100 mcg total) by mouth daily before breakfast.    No facility-administered encounter medications on file as of 01/10/2018.     No Known Allergies  Review of Systems  Constitutional: Negative for fever and malaise/fatigue.  HENT: Negative.   Eyes: Negative.   Respiratory: Negative for cough, shortness of breath and wheezing.   Cardiovascular: Negative for chest pain, palpitations, orthopnea, claudication and leg swelling.  Gastrointestinal: Negative.   Genitourinary: Negative.   Musculoskeletal: Positive for joint pain.  Skin: Negative.   Neurological: Negative.        MCI stable.  Endo/Heme/Allergies: Negative.   Psychiatric/Behavioral: Negative.        Objective:  BP (!) 152/70 (BP Location: Right Arm, Patient Position: Sitting, Cuff Size: Normal)   Pulse 73   Temp (!) 97.4 F (36.3 C) (Oral)   Resp 16   Wt 134 lb (60.8 kg)   LMP  (LMP Unknown)   SpO2 99%   BMI 27.06 kg/m   Physical Exam  Constitutional: She is oriented to person, place, and time and well-developed, well-nourished, and in no distress.  HENT:  Head: Normocephalic and atraumatic.  Right Ear: External ear normal.  Left Ear: External ear normal.  Nose: Nose normal.  Eyes: Conjunctivae are normal. No scleral icterus.  Neck:  No thyromegaly present.  Cardiovascular: Normal rate, regular rhythm and normal heart sounds.  Pulmonary/Chest: Effort normal and breath sounds normal.  Abdominal: Soft.  Musculoskeletal: She exhibits no edema.  Neurological: She is alert and oriented to person, place, and time. Gait normal. GCS score is 15.  Skin: Skin is warm and dry.  Psychiatric: Mood, memory, affect  and judgment normal.    Assessment and Plan :    1. arthritis involving left hand,  (HCC) Try short term NSAID. - DG Hand Complete Left; Future - meloxicam (MOBIC) 7.5 MG tablet; Take 1 tablet (7.5 mg total) by mouth daily.  Dispense: 30 tablet; Refill: 0  2. Essential hypertension   3. Adult hypothyroidism Adjustment made to medication last visit.  Recheck level today. - TSH  4. Depression, major, single episode, complete remission ([place]) Patient scored 0 on her PHQ9 today.  Will discuss weaning medicine in the spring. 5.MCI Stable.RTC 3-4 months.  HPI, Exam and A&P Transcribed under the direction and in the presence of Miguel Aschoff, Brooke Bonito., MD. Electronically Signed: Althea Charon, RMA I have done the exam and reviewed the chart and it is accurate to the best of my knowledge. Development worker, community has been used and  any errors in dictation or transcription are unintentional. Miguel Aschoff M.D. [facility]

## 2018-01-11 ENCOUNTER — Telehealth: Payer: Self-pay

## 2018-01-11 ENCOUNTER — Telehealth: Payer: Self-pay | Admitting: Family Medicine

## 2018-01-11 DIAGNOSIS — E039 Hypothyroidism, unspecified: Secondary | ICD-10-CM | POA: Diagnosis not present

## 2018-01-11 NOTE — Telephone Encounter (Signed)
-----   Message from Jerrol Banana., MD sent at 01/11/2018  9:18 AM EST ----- Arthritis present.

## 2018-01-11 NOTE — Telephone Encounter (Signed)
Please review

## 2018-01-11 NOTE — Telephone Encounter (Signed)
Patient and husb came by office to get xray results from 01/10/18.  Please call with results

## 2018-01-11 NOTE — Telephone Encounter (Signed)
[id number] 

## 2018-01-12 LAB — TSH: TSH: 3.97 u[IU]/mL (ref 0.450–4.500)

## 2018-01-12 NOTE — Telephone Encounter (Signed)
Patient as advised she wants to know what she can take otc for arthritic pain? KW

## 2018-01-12 NOTE — Telephone Encounter (Signed)
Patient advised.

## 2018-01-12 NOTE — Telephone Encounter (Signed)
Tylenol safest.

## 2018-02-08 ENCOUNTER — Telehealth: Payer: Self-pay | Admitting: *Deleted

## 2018-02-08 NOTE — Telephone Encounter (Signed)
Patient states sertraline is no longer working. Patient is requesting a different medication? Please advise?

## 2018-02-08 NOTE — Telephone Encounter (Signed)
Please review. Thanks!  

## 2018-02-09 ENCOUNTER — Other Ambulatory Visit: Payer: Self-pay

## 2018-02-09 ENCOUNTER — Other Ambulatory Visit: Payer: Self-pay | Admitting: Family Medicine

## 2018-02-09 DIAGNOSIS — M069 Rheumatoid arthritis, unspecified: Secondary | ICD-10-CM

## 2018-02-09 MED ORDER — SERTRALINE HCL 100 MG PO TABS: 150.0000 mg | ORAL_TABLET | Freq: Every day | ORAL | 3 refills | Status: DC

## 2018-02-09 NOTE — Telephone Encounter (Signed)
Done

## 2018-02-09 NOTE — Telephone Encounter (Signed)
Increase from 100 to 150mg  daily--see me 1 month

## 2018-03-14 ENCOUNTER — Ambulatory Visit: Payer: Medicare Other | Admitting: Family Medicine

## 2018-03-15 ENCOUNTER — Telehealth: Payer: Self-pay | Admitting: Family Medicine

## 2018-03-15 NOTE — Telephone Encounter (Signed)
Sertraline is making her feel strange.  Its making her have bad dreams.   Is lethargic, has no "get up and go".  Wants to try something else.  [facility].

## 2018-03-15 NOTE — Telephone Encounter (Signed)
Please advise.  It is of note that on 02/08/18 her dose was increased from 100 to 150 because she didn't think it was working anymore.

## 2018-03-20 ENCOUNTER — Encounter: Payer: Self-pay | Admitting: Family Medicine

## 2018-03-20 ENCOUNTER — Ambulatory Visit (INDEPENDENT_AMBULATORY_CARE_PROVIDER_SITE_OTHER): Payer: Medicare Other | Admitting: Family Medicine

## 2018-03-20 VITALS — BP 126/60 | HR 56 | Temp 98.3°F | Resp 16 | Wt 137.0 lb

## 2018-03-20 DIAGNOSIS — G301 Alzheimer's disease with late onset: Secondary | ICD-10-CM

## 2018-03-20 DIAGNOSIS — F418 Other specified anxiety disorders: Secondary | ICD-10-CM | POA: Diagnosis not present

## 2018-03-20 DIAGNOSIS — I10 Essential (primary) hypertension: Secondary | ICD-10-CM

## 2018-03-20 DIAGNOSIS — F0280 Dementia in other diseases classified elsewhere without behavioral disturbance: Secondary | ICD-10-CM | POA: Diagnosis not present

## 2018-03-20 DIAGNOSIS — F320 Major depressive disorder, single episode, mild: Secondary | ICD-10-CM | POA: Diagnosis not present

## 2018-03-20 DIAGNOSIS — G309 Alzheimer's disease, unspecified: Secondary | ICD-10-CM | POA: Diagnosis not present

## 2018-03-20 DIAGNOSIS — F0150 Vascular dementia without behavioral disturbance: Secondary | ICD-10-CM | POA: Diagnosis not present

## 2018-03-20 NOTE — Patient Instructions (Signed)
Decrease back down to Sertraline 100mg  daily.

## 2018-03-20 NOTE — Progress Notes (Signed)
Patient: Paige Baldwin Female    DOB: 1946/02/22   73 y.o.   MRN: [id number] Visit Date: 03/20/2018  Today's Provider: [name], MD   Chief Complaint  Patient presents with  . Depression   Subjective:     HPI Patient comes in today for a follow up. She recently had Sertraline increased from 100mg  to 150mg  daily. Patient then calls about 4 weeks later saying that Sertraline was making her feel strange, with sensations of fatigue and weakness. She also mentions that it gave her bad dreams. She reports that it does not help with her mood and depression.  She is married and now has 7 grandchildren. Depression screen [facility] 2/9 03/20/2018 01/10/2018 10/03/2017  Decreased Interest 0 0 0  Down, Depressed, Hopeless 0 0 0  PHQ - 2 Score 0 0 0  Altered sleeping 0 0 0  Tired, decreased energy 0 0 3  Change in appetite 0 0 0  Feeling bad or failure about yourself  0 0 0  Trouble concentrating 0 0 0  Moving slowly or fidgety/restless 0 0 0  Suicidal thoughts 0 0 0  PHQ-9 Score 0 0 3  Difficult doing work/chores Not difficult at all Not difficult at all Not difficult at all    No Known Allergies   Current Outpatient Medications:  .  amLODipine (NORVASC) 10 MG tablet, Take 1 tablet (10 mg total) by mouth daily., Disp: 30 tablet, Rfl: 12 .  Cholecalciferol (VITAMIN D-3) 5000 units TABS, Take by mouth daily., Disp: , Rfl:  .  COLOSTRUM PO, Take by mouth daily. , Disp: , Rfl:  .  donepezil (ARICEPT) 5 MG tablet, TAKE 1 TABLET BY MOUTH EVERY DAY AT NIGHT, Disp: , Rfl: 3 .  isosorbide mononitrate (IMDUR) 30 MG 24 hr tablet, Take 1 tablet (30 mg total) by mouth every morning., Disp: 30 tablet, Rfl: 12 .  levothyroxine (SYNTHROID, LEVOTHROID) 25 MCG tablet, Take 1 tablet (25 mcg total) by mouth daily before breakfast., Disp: 30 tablet, Rfl: 11 .  losartan (COZAAR) 100 MG tablet, Take 1 tablet (100 mg total) by mouth daily., Disp: 90 tablet, Rfl: 3 .  Magnesium Malate 1250 (141.7 Mg)  MG TABS, Take 0.5 tablets by mouth daily. , Disp: , Rfl:  .  meloxicam (MOBIC) 7.5 MG tablet, TAKE 1 TABLET BY MOUTH EVERY DAY, Disp: 30 tablet, Rfl: 2 .  metoprolol tartrate (LOPRESSOR) 50 MG tablet, TAKE ONE TABLET BY MOUTH EVERY DAY, Disp: 30 tablet, Rfl: 12 .  NON FORMULARY, brain elevate 1-2 capsules daily, Disp: , Rfl:  .  NON FORMULARY, Total Restore - 1 capsule daily, Disp: , Rfl:  .  omeprazole (PRILOSEC) 40 MG capsule, Take 40 mg by mouth daily., Disp: , Rfl:  .  pantoprazole (PROTONIX) 40 MG tablet, Take 1 tablet (40 mg total) by mouth daily., Disp: 30 tablet, Rfl: 3 .  PAPAYA ENZYME PO, Take 1 tablet by mouth 3 (three) times daily. , Disp: , Rfl:  .  potassium chloride (K-DUR,KLOR-CON) 10 MEQ tablet, TAKE ONE TABLET BY MOUTH EVERY DAY, Disp: 30 tablet, Rfl: 12 .  Probiotic Product (PROBIOTIC-10 PO), Take 1 capsule by mouth daily., Disp: , Rfl:  .  rosuvastatin (CRESTOR) 10 MG tablet, Take 1 tablet (10 mg total) by mouth at bedtime., Disp: 30 tablet, Rfl: 12 .  sertraline (ZOLOFT) 100 MG tablet, Take 1.5 tablets by mouth daily., Disp: , Rfl:  .  sertraline (ZOLOFT) 100 MG tablet,  Take 1.5 tablets (150 mg total) by mouth daily., Disp: 135 tablet, Rfl: 3  Review of Systems  Constitutional: Positive for fatigue.  HENT: Negative.   Eyes: Negative.   Respiratory: Negative for cough.   Cardiovascular: Negative for chest pain, palpitations and leg swelling.  Gastrointestinal: Negative.   Endocrine: Negative.   Allergic/Immunologic: Negative.   Neurological: Negative for dizziness, light-headedness and headaches.  Psychiatric/Behavioral: Positive for confusion and decreased concentration. Negative for agitation, self-injury, sleep disturbance and suicidal ideas. The patient is nervous/anxious.     Social History   Tobacco Use  . Smoking status: Never Smoker  . Smokeless tobacco: Never Used  Substance Use Topics  . Alcohol use: No      Objective:   BP 126/60 (BP Location: Left  Arm, Patient Position: Sitting, Cuff Size: Normal)   Pulse (!) 56   Temp 98.3 F (36.8 C)   Resp 16   Wt 137 lb (62.1 kg)   LMP  (LMP Unknown)   SpO2 100%   BMI 27.67 kg/m  Vitals:   03/20/18 1107  BP: 126/60  Pulse: (!) 56  Resp: 16  Temp: 98.3 F (36.8 C)  SpO2: 100%  Weight: 137 lb (62.1 kg)     Physical Exam Constitutional:      Appearance: She is well-developed.  HENT:     Head: Normocephalic and atraumatic.     Right Ear: External ear normal.     Left Ear: External ear normal.     Nose: Nose normal.  Eyes:     General: No scleral icterus.    Conjunctiva/sclera: Conjunctivae normal.  Neck:     Thyroid: No thyromegaly.  Cardiovascular:     Rate and Rhythm: Normal rate and regular rhythm.     Heart sounds: Normal heart sounds.  Pulmonary:     Effort: Pulmonary effort is normal.     Breath sounds: Normal breath sounds.  Abdominal:     Palpations: Abdomen is soft.  Skin:    General: Skin is warm and dry.  Neurological:     Mental Status: She is alert and oriented to person, place, and time.  Psychiatric:        Behavior: Behavior normal.        Thought Content: Thought content normal.        Judgment: Judgment normal.         Assessment & Plan    1. Mild major depression (HCC) RTC 4-6 months  2. Essential hypertension   3. Late onset Alzheimer's disease without behavioral disturbance [facility]) Patient carries diagnoses of combination of mild Alzheimer's and vascular dementia.  Follow clinically.    Laqueta Linden, MD  [facility]

## 2018-03-23 NOTE — Telephone Encounter (Signed)
Patient was seen and meds were adjusted

## 2018-03-24 DIAGNOSIS — F0280 Dementia in other diseases classified elsewhere without behavioral disturbance: Secondary | ICD-10-CM | POA: Insufficient documentation

## 2018-03-24 DIAGNOSIS — G309 Alzheimer's disease, unspecified: Secondary | ICD-10-CM

## 2018-03-29 ENCOUNTER — Other Ambulatory Visit: Payer: Self-pay | Admitting: Family Medicine

## 2018-03-29 DIAGNOSIS — I10 Essential (primary) hypertension: Secondary | ICD-10-CM

## 2018-04-11 ENCOUNTER — Encounter: Payer: Self-pay | Admitting: Family Medicine

## 2018-04-11 ENCOUNTER — Ambulatory Visit (INDEPENDENT_AMBULATORY_CARE_PROVIDER_SITE_OTHER): Payer: Medicare Other | Admitting: Family Medicine

## 2018-04-11 VITALS — BP 124/72 | HR 66 | Temp 98.3°F | Resp 16 | Wt 134.0 lb

## 2018-04-11 DIAGNOSIS — K219 Gastro-esophageal reflux disease without esophagitis: Secondary | ICD-10-CM

## 2018-04-11 DIAGNOSIS — R059 Cough, unspecified: Secondary | ICD-10-CM

## 2018-04-11 DIAGNOSIS — G301 Alzheimer's disease with late onset: Secondary | ICD-10-CM | POA: Diagnosis not present

## 2018-04-11 DIAGNOSIS — I10 Essential (primary) hypertension: Secondary | ICD-10-CM | POA: Diagnosis not present

## 2018-04-11 DIAGNOSIS — F0280 Dementia in other diseases classified elsewhere without behavioral disturbance: Secondary | ICD-10-CM

## 2018-04-11 DIAGNOSIS — J441 Chronic obstructive pulmonary disease with (acute) exacerbation: Secondary | ICD-10-CM | POA: Diagnosis not present

## 2018-04-11 DIAGNOSIS — F3341 Major depressive disorder, recurrent, in partial remission: Secondary | ICD-10-CM | POA: Diagnosis not present

## 2018-04-11 DIAGNOSIS — E039 Hypothyroidism, unspecified: Secondary | ICD-10-CM

## 2018-04-11 DIAGNOSIS — R05 Cough: Secondary | ICD-10-CM

## 2018-04-11 MED ORDER — DOXYCYCLINE HYCLATE 100 MG PO TABS: 100.0000 mg | ORAL_TABLET | Freq: Two times a day (BID) | ORAL | 0 refills | Status: AC

## 2018-04-11 NOTE — Progress Notes (Signed)
Patient: Paige Baldwin Female    DOB: 11-26-1945   73 y.o.   MRN: [id number] Visit Date: 04/11/2018  Today's Provider: [name], MD   Chief Complaint  Patient presents with  . Cough   Subjective:     Cough  This is a new problem. The current episode started 1 to 4 weeks ago (about 4 weeks). The problem has been unchanged. The cough is non-productive. Associated symptoms include postnasal drip, rhinorrhea and a sore throat. Pertinent negatives include no ear pain, fever or headaches. Nothing aggravates the symptoms. She has tried OTC cough suppressant for the symptoms. The treatment provided no relief. There is no history of asthma, COPD or environmental allergies.  Her husband is with her today and they report that she is essentially had a cough since Christmas.  She describes a little chest tightness at times.  It does not seem to be exertional.  No orthopnea or PND. Patient reports that she has taken Mucinex with no relief.   No Known Allergies   Current Outpatient Medications:  .  amLODipine (NORVASC) 10 MG tablet, TAKE ONE TABLET BY MOUTH EVERY DAY, Disp: 30 tablet, Rfl: 12 .  Cholecalciferol (VITAMIN D-3) 5000 units TABS, Take by mouth daily., Disp: , Rfl:  .  COLOSTRUM PO, Take by mouth daily. , Disp: , Rfl:  .  donepezil (ARICEPT) 5 MG tablet, TAKE 1 TABLET BY MOUTH EVERY DAY AT NIGHT, Disp: , Rfl: 3 .  isosorbide mononitrate (IMDUR) 30 MG 24 hr tablet, Take 1 tablet (30 mg total) by mouth every morning., Disp: 30 tablet, Rfl: 12 .  levothyroxine (SYNTHROID, LEVOTHROID) 25 MCG tablet, Take 1 tablet (25 mcg total) by mouth daily before breakfast., Disp: 30 tablet, Rfl: 11 .  losartan (COZAAR) 100 MG tablet, Take 1 tablet (100 mg total) by mouth daily., Disp: 90 tablet, Rfl: 3 .  Magnesium Malate 1250 (141.7 Mg) MG TABS, Take 0.5 tablets by mouth daily. , Disp: , Rfl:  .  meloxicam (MOBIC) 7.5 MG tablet, TAKE 1 TABLET BY MOUTH EVERY DAY, Disp: 30 tablet, Rfl: 2 .   metoprolol tartrate (LOPRESSOR) 50 MG tablet, TAKE ONE TABLET BY MOUTH EVERY DAY, Disp: 30 tablet, Rfl: 12 .  NON FORMULARY, brain elevate 1-2 capsules daily, Disp: , Rfl:  .  NON FORMULARY, Total Restore - 1 capsule daily, Disp: , Rfl:  .  omeprazole (PRILOSEC) 40 MG capsule, Take 40 mg by mouth daily., Disp: , Rfl:  .  pantoprazole (PROTONIX) 40 MG tablet, Take 1 tablet (40 mg total) by mouth daily., Disp: 30 tablet, Rfl: 3 .  PAPAYA ENZYME PO, Take 1 tablet by mouth 3 (three) times daily. , Disp: , Rfl:  .  potassium chloride (K-DUR,KLOR-CON) 10 MEQ tablet, TAKE ONE TABLET BY MOUTH EVERY DAY, Disp: 30 tablet, Rfl: 12 .  Probiotic Product (PROBIOTIC-10 PO), Take 1 capsule by mouth daily., Disp: , Rfl:  .  rosuvastatin (CRESTOR) 10 MG tablet, Take 1 tablet (10 mg total) by mouth at bedtime., Disp: 30 tablet, Rfl: 12 .  sertraline (ZOLOFT) 100 MG tablet, Take 1.5 tablets by mouth daily., Disp: , Rfl:  .  sertraline (ZOLOFT) 100 MG tablet, Take 1.5 tablets (150 mg total) by mouth daily., Disp: 135 tablet, Rfl: 3  Review of Systems  Constitutional: Negative for fever.  HENT: Positive for postnasal drip, rhinorrhea and sore throat. Negative for ear pain.   Respiratory: Positive for cough.   Allergic/Immunologic: Negative for  environmental allergies.  Neurological: Negative for headaches.    Social History   Tobacco Use  . Smoking status: Never Smoker  . Smokeless tobacco: Never Used  Substance Use Topics  . Alcohol use: No      Objective:   BP 124/72 (BP Location: Left Arm, Patient Position: Sitting, Cuff Size: Normal)   Pulse 66   Temp 98.3 F (36.8 C)   Resp 16   Wt 134 lb (60.8 kg)   LMP  (LMP Unknown)   SpO2 98%   BMI 27.06 kg/m  Vitals:   04/11/18 1138  BP: 124/72  Pulse: 66  Resp: 16  Temp: 98.3 F (36.8 C)  SpO2: 98%  Weight: 134 lb (60.8 kg)     Physical Exam Vitals signs reviewed.  Constitutional:      Appearance: Normal appearance. She is well-developed  and normal weight.  HENT:     Head: Normocephalic and atraumatic.     Right Ear: External ear normal.     Left Ear: Tympanic membrane and external ear normal.     Nose: Nose normal.     Mouth/Throat:     Pharynx: Oropharynx is clear.  Eyes:     General: No scleral icterus.    Conjunctiva/sclera: Conjunctivae normal.  Neck:     Thyroid: No thyromegaly.  Cardiovascular:     Rate and Rhythm: Normal rate and regular rhythm.     Heart sounds: Normal heart sounds.  Pulmonary:     Effort: Pulmonary effort is normal.     Breath sounds: Normal breath sounds.  Abdominal:     Palpations: Abdomen is soft.  Musculoskeletal:     Right lower leg: No edema.     Left lower leg: No edema.  Lymphadenopathy:     Cervical: No cervical adenopathy.  Skin:    General: Skin is warm and dry.  Neurological:     General: No focal deficit present.     Mental Status: She is alert and oriented to person, place, and time. Mental status is at baseline.  Psychiatric:        Mood and Affect: Mood normal.        Behavior: Behavior normal.        Thought Content: Thought content normal.        Judgment: Judgment normal.   ECG reveals sinus bradycardia with nonspecific T wave inversion anteriorly      Assessment & Plan    1. Cough Use Robitussin at least twice a day.  Increase fluid intake.  May need pulmonary referral. - EKG 12-Lead  2. COPD exacerbation ([place]) May need chest x-ray - doxycycline (VIBRA-TABS) 100 MG tablet; Take 1 tablet (100 mg total) by mouth 2 (two) times daily for 7 days.  Dispense: 14 tablet; Refill: 0  3. Essential hypertension Controlled  4. Gastroesophageal reflux disease, esophagitis presence not specified Continue PPI  5. Adult hypothyroidism   6. Late onset Alzheimer's disease without behavioral disturbance ([place]) MMSE on her next visit  7. Recurrent major depressive disorder, in partial remission ([place]) I think her depressive symptoms years ago were probably  anxiety mixed with her very early dementia.  Consider stopping the antidepressant in the future although I do do think it helps her anxiety.    I have done the exam and reviewed the above chart and it is accurate to the best of my knowledge. Development worker, community has been used in this note in any air is in the dictation or transcription are  unintentional.  [name], MD  [facility]

## 2018-04-11 NOTE — Patient Instructions (Signed)
Increase fluids and Robitussin DM at bedtime for cough.

## 2018-04-13 ENCOUNTER — Other Ambulatory Visit: Payer: Self-pay | Admitting: Family Medicine

## 2018-05-05 ENCOUNTER — Other Ambulatory Visit: Payer: Self-pay | Admitting: Family Medicine

## 2018-05-05 DIAGNOSIS — M069 Rheumatoid arthritis, unspecified: Secondary | ICD-10-CM

## 2018-05-11 ENCOUNTER — Ambulatory Visit: Payer: Self-pay | Admitting: Family Medicine

## 2018-05-25 ENCOUNTER — Other Ambulatory Visit: Payer: Self-pay | Admitting: Family Medicine

## 2018-08-21 ENCOUNTER — Other Ambulatory Visit: Payer: Self-pay

## 2018-08-21 ENCOUNTER — Ambulatory Visit (INDEPENDENT_AMBULATORY_CARE_PROVIDER_SITE_OTHER): Payer: Medicare Other | Admitting: Family Medicine

## 2018-08-21 ENCOUNTER — Encounter: Payer: Self-pay | Admitting: Family Medicine

## 2018-08-21 VITALS — BP 120/68 | HR 67 | Temp 98.3°F | Resp 16 | Wt 136.6 lb

## 2018-08-21 DIAGNOSIS — F0280 Dementia in other diseases classified elsewhere without behavioral disturbance: Secondary | ICD-10-CM | POA: Diagnosis not present

## 2018-08-21 DIAGNOSIS — I10 Essential (primary) hypertension: Secondary | ICD-10-CM | POA: Diagnosis not present

## 2018-08-21 DIAGNOSIS — E039 Hypothyroidism, unspecified: Secondary | ICD-10-CM | POA: Diagnosis not present

## 2018-08-21 DIAGNOSIS — G301 Alzheimer's disease with late onset: Secondary | ICD-10-CM

## 2018-08-21 DIAGNOSIS — F419 Anxiety disorder, unspecified: Secondary | ICD-10-CM | POA: Diagnosis not present

## 2018-08-21 MED ORDER — DONEPEZIL HCL 10 MG PO TABS: 10.0000 mg | ORAL_TABLET | Freq: Every day | ORAL | 1 refills | Status: DC

## 2018-08-21 NOTE — Progress Notes (Signed)
Patient: Paige Baldwin Female    DOB: Oct 02, 1945   73 y.o.   MRN: [id number] Visit Date: 08/21/2018  Today's Provider: [name], MD   Chief Complaint  Patient presents with  . Follow-up   Subjective:     HPI  5 Month Follow Up. Patient states that she is having some feelings of anxiety while on Sertraline.  MMSE - Mini Mental State Exam 08/21/2018 01/10/2018  Orientation to time 3 4  Orientation to Place 5 5  Registration 3 3  Attention/ Calculation 2 5  Recall 2 0  Language- name 2 objects 2 2  Language- repeat 0 1  Language- follow 3 step command 3 3  Language- read & follow direction 1 1  Write a sentence 1 1  Copy design 1 1  Total score 23 26   GAD 7 : Generalized Anxiety Score 08/21/2018  Nervous, Anxious, on Edge 2  Control/stop worrying 2  Worry too much - different things 3  Trouble relaxing 3  Restless 0  Easily annoyed or irritable 1  Afraid - awful might happen 0  Total GAD 7 Score 11  Anxiety Difficulty Somewhat difficult       No Known Allergies   Current Outpatient Medications:  .  amLODipine (NORVASC) 10 MG tablet, TAKE ONE TABLET BY MOUTH EVERY DAY, Disp: 30 tablet, Rfl: 12 .  Cholecalciferol (VITAMIN D-3) 5000 units TABS, Take by mouth daily., Disp: , Rfl:  .  COLOSTRUM PO, Take by mouth daily. , Disp: , Rfl:  .  isosorbide mononitrate (IMDUR) 30 MG 24 hr tablet, Take 1 tablet (30 mg total) by mouth every morning., Disp: 30 tablet, Rfl: 12 .  levothyroxine (SYNTHROID, LEVOTHROID) 25 MCG tablet, Take 1 tablet (25 mcg total) by mouth daily before breakfast., Disp: 30 tablet, Rfl: 11 .  losartan (COZAAR) 100 MG tablet, Take 1 tablet (100 mg total) by mouth daily., Disp: 90 tablet, Rfl: 3 .  meloxicam (MOBIC) 7.5 MG tablet, TAKE 1 TABLET BY MOUTH EVERY DAY, Disp: 30 tablet, Rfl: 2 .  metoprolol tartrate (LOPRESSOR) 50 MG tablet, TAKE ONE TABLET BY MOUTH EVERY DAY, Disp: 30 tablet, Rfl: 12 .  NON FORMULARY, brain elevate 1-2  capsules daily, Disp: , Rfl:  .  NON FORMULARY, Total Restore - 1 capsule daily, Disp: , Rfl:  .  potassium chloride (K-DUR,KLOR-CON) 10 MEQ tablet, TAKE ONE TABLET BY MOUTH EVERY DAY, Disp: 30 tablet, Rfl: 12 .  Probiotic Product (PROBIOTIC-10 PO), Take 1 capsule by mouth daily., Disp: , Rfl:  .  rosuvastatin (CRESTOR) 10 MG tablet, Take 1 tablet (10 mg total) by mouth at bedtime., Disp: 30 tablet, Rfl: 12 .  sertraline (ZOLOFT) 100 MG tablet, Take 1.5 tablets by mouth daily., Disp: , Rfl:  .  sertraline (ZOLOFT) 100 MG tablet, Take 1.5 tablets (150 mg total) by mouth daily., Disp: 135 tablet, Rfl: 3 .  donepezil (ARICEPT) 10 MG tablet, Take 1 tablet (10 mg total) by mouth at bedtime., Disp: 90 tablet, Rfl: 1 .  Magnesium Malate 1250 (141.7 Mg) MG TABS, Take 0.5 tablets by mouth daily. , Disp: , Rfl:  .  omeprazole (PRILOSEC) 40 MG capsule, Take 40 mg by mouth daily., Disp: , Rfl:  .  pantoprazole (PROTONIX) 40 MG tablet, TAKE 1 TABLET BY MOUTH EVERY DAY (Patient not taking: Reported on 08/21/2018), Disp: 90 tablet, Rfl: 1 .  PAPAYA ENZYME PO, Take 1 tablet by mouth 3 (three) times daily. ,  Disp: , Rfl:   Review of Systems  Constitutional: Negative.   Eyes: Negative.   Respiratory: Negative.   Cardiovascular: Negative.   Gastrointestinal: Negative.   Genitourinary: Negative.   Skin: Negative.   Neurological:       Memory loss.  Psychiatric/Behavioral: The patient is nervous/anxious.   All other systems reviewed and are negative.   Social History   Tobacco Use  . Smoking status: Never Smoker  . Smokeless tobacco: Never Used  Substance Use Topics  . Alcohol use: No      Objective:   BP 120/68 (BP Location: Right Arm, Patient Position: Sitting, Cuff Size: Normal)   Pulse 67   Temp 98.3 F (36.8 C) (Oral)   Resp 16   Wt 136 lb 9.6 oz (62 kg)   LMP  (LMP Unknown)   SpO2 98%   BMI 27.59 kg/m  Vitals:   08/21/18 1148  BP: 120/68  Pulse: 67  Resp: 16  Temp: 98.3 F (36.8  C)  TempSrc: Oral  SpO2: 98%  Weight: 136 lb 9.6 oz (62 kg)     Physical Exam Vitals signs reviewed.  Constitutional:      Appearance: Normal appearance. She is well-developed and normal weight.  HENT:     Head: Normocephalic and atraumatic.     Right Ear: External ear normal.     Left Ear: Tympanic membrane and external ear normal.     Nose: Nose normal.     Mouth/Throat:     Pharynx: Oropharynx is clear.  Eyes:     General: No scleral icterus.    Conjunctiva/sclera: Conjunctivae normal.  Neck:     Thyroid: No thyromegaly.  Cardiovascular:     Rate and Rhythm: Normal rate and regular rhythm.     Heart sounds: Normal heart sounds.  Pulmonary:     Effort: Pulmonary effort is normal.     Breath sounds: Normal breath sounds.  Abdominal:     Palpations: Abdomen is soft.  Musculoskeletal:     Right lower leg: No edema.     Left lower leg: No edema.  Lymphadenopathy:     Cervical: No cervical adenopathy.  Skin:    General: Skin is warm and dry.  Neurological:     General: No focal deficit present.     Mental Status: She is alert and oriented to person, place, and time. Mental status is at baseline.  Psychiatric:        Mood and Affect: Mood normal.        Behavior: Behavior normal.        Thought Content: Thought content normal.        Judgment: Judgment normal.   MMSE--23   No results found for any visits on 08/21/18.     Assessment & Plan    1. Late onset Alzheimer's disease without behavioral disturbance (HCC) Mild but will increase Donepezil dose to 10mg . - donepezil (ARICEPT) 10 MG tablet; Take 1 tablet (10 mg total) by mouth at bedtime.  Dispense: 90 tablet; Refill: 1  2. Anxiety Fairly stable.  3. Essential hypertension Controlled on Norvasc and Metoprolol.  4. Adult hypothyroidism      [name], MD  [facility]

## 2018-10-01 ENCOUNTER — Other Ambulatory Visit: Payer: Self-pay | Admitting: Family Medicine

## 2018-10-01 DIAGNOSIS — I10 Essential (primary) hypertension: Secondary | ICD-10-CM

## 2018-10-04 NOTE — Progress Notes (Signed)
Subjective:   Paige Baldwin is a 73 y.o. female who presents for Medicare Annual (Subsequent) preventive examination.    This visit is being conducted through telemedicine due to the COVID-19 pandemic. This patient has given me verbal consent via doximity to conduct this visit, patient states they are participating from their home address. Some vital signs may be absent or patient reported.    Patient identification: identified by name, DOB, and current address  Review of Systems:  N/A  Cardiac Risk Factors include: advanced age (>38men, >4 women);dyslipidemia;hypertension     Objective:     Vitals: LMP  (LMP Unknown)   There is no height or weight on file to calculate BMI. Unable to obtain vitals due to visit being conducted via telephonically.   Advanced Directives 10/05/2018 10/03/2017 06/29/2016 06/26/2015 06/25/2014 06/18/2014  Does Patient Have a Medical Advance Directive? Yes Yes No No No No  Type of Paramedic of [place];Living will [place];Living will - - - -  Copy of [place] in Chart? No - copy requested No - copy requested - - - -  Would patient like information on creating a medical advance directive? - No - Patient declined No - Patient declined - - No - patient declined information    Tobacco Social History   Tobacco Use  Smoking Status Never Smoker  Smokeless Tobacco Never Used     Counseling given: Not Answered   Clinical Intake:  Pre-visit preparation completed: Yes  Pain : No/denies pain Pain Score: 0-No pain     Nutritional Risks: None Diabetes: No  How often do you need to have someone help you when you read instructions, pamphlets, or other written materials from your doctor or pharmacy?: 1 - Never  Interpreter Needed?: No  Information entered by :: [facility], LPN  Past Medical History:  Diagnosis Date  . Dementia ([place])   . Depression   . GERD (gastroesophageal reflux  disease)   . Hyperlipidemia   . Hypertension   . Hypothyroidism    Past Surgical History:  Procedure Laterality Date  . ABDOMINAL HYSTERECTOMY    . APPENDECTOMY     during cholecystectomy  . BACK SURGERY    . CHOLECYSTECTOMY     removed appendix at this time  . COLONOSCOPY WITH PROPOFOL N/A 06/30/2015   Procedure: COLONOSCOPY WITH PROPOFOL;  Surgeon: Manya Silvas, MD;  [facility] ENDOSCOPY;  Service: Endoscopy;  Laterality: N/A;tubular adenoma repeat 06/2020  . FINGER ARTHROPLASTY Right 06/25/2014   Procedure: FINGER ARTHROPLASTY;  Surgeon: Christophe Louis, MD;  Location: [facility] ORS;  Service: Orthopedics;  Laterality: Right;  . SALPINGOOPHORECTOMY     Family History  Problem Relation Age of Onset  . Hypertension Father   . Heart disease Father   . Diabetes Father   . Congestive Heart Failure Father   . Stroke Paternal Uncle   . Hypertension Brother   . CAD Brother   . Anemia Brother   . Breast cancer Maternal Aunt    Social History   Socioeconomic History  . Marital status: Married    Spouse name: Not on file  . Number of children: 2  . Years of education: Not on file  . Highest education level: Some college, no degree  Occupational History  . Occupation: retired  Scientific laboratory technician  . Financial resource strain: Not hard at all  . Food insecurity    Worry: Never true    Inability: Never true  .  Transportation needs    Medical: No    Non-medical: No  Tobacco Use  . Smoking status: Never Smoker  . Smokeless tobacco: Never Used  Substance and Sexual Activity  . Alcohol use: No  . Drug use: No  . Sexual activity: Not on file  Lifestyle  . Physical activity    Days per week: 3 days    Minutes per session: 30 min  . Stress: Not at all  Relationships  . Social Herbalist on phone: Patient refused    Gets together: Patient refused    Attends religious service: Patient refused    Active member of club or organization: Patient refused    Attends  meetings of clubs or organizations: Patient refused    Relationship status: Patient refused  Other Topics Concern  . Not on file  Social History Narrative  . Not on file    Outpatient Encounter Medications as of 10/05/2018  Medication Sig  . amLODipine (NORVASC) 10 MG tablet TAKE ONE TABLET BY MOUTH EVERY DAY  . Cholecalciferol (VITAMIN D-3) 5000 units TABS Take by mouth daily.  Marland Kitchen donepezil (ARICEPT) 10 MG tablet Take 1 tablet (10 mg total) by mouth at bedtime.  . isosorbide mononitrate (IMDUR) 30 MG 24 hr tablet Take 1 tablet (30 mg total) by mouth every morning.  Marland Kitchen levothyroxine (SYNTHROID) 100 MCG tablet TAKE 1 TABLET (100 MCG TOTAL) BY MOUTH DAILY BEFORE BREAKFAST.  Marland Kitchen losartan (COZAAR) 100 MG tablet TAKE 1 TABLET BY MOUTH EVERY DAY  . meloxicam (MOBIC) 7.5 MG tablet TAKE 1 TABLET BY MOUTH EVERY DAY  . metoprolol tartrate (LOPRESSOR) 50 MG tablet TAKE ONE TABLET BY MOUTH EVERY DAY  . pantoprazole (PROTONIX) 40 MG tablet TAKE 1 TABLET BY MOUTH EVERY DAY  . potassium chloride (K-DUR,KLOR-CON) 10 MEQ tablet TAKE ONE TABLET BY MOUTH EVERY DAY  . Probiotic Product (PROBIOTIC-10 PO) Take 1 capsule by mouth daily.  . sertraline (ZOLOFT) 100 MG tablet Take 1.5 tablets (150 mg total) by mouth daily.  . COLOSTRUM PO Take by mouth daily.   Marland Kitchen levothyroxine (SYNTHROID, LEVOTHROID) 25 MCG tablet Take 1 tablet (25 mcg total) by mouth daily before breakfast. (Patient not taking: Reported on 10/05/2018)  . Magnesium Malate 1250 (141.7 Mg) MG TABS Take 0.5 tablets by mouth daily.   . NON FORMULARY brain elevate 1-2 capsules daily  . NON FORMULARY Total Restore - 1 capsule daily  . omeprazole (PRILOSEC) 40 MG capsule Take 40 mg by mouth daily.  Marland Kitchen PAPAYA ENZYME PO Take 1 tablet by mouth 3 (three) times daily.   . rosuvastatin (CRESTOR) 10 MG tablet Take 1 tablet (10 mg total) by mouth at bedtime. (Patient not taking: Reported on 10/05/2018)  . sertraline (ZOLOFT) 100 MG tablet Take 1.5 tablets by mouth  daily.   No facility-administered encounter medications on file as of 10/05/2018.     Activities of Daily Living In your present state of health, do you have any difficulty performing the following activities: 10/05/2018  Hearing? N  Vision? N  Difficulty concentrating or making decisions? Y  Comment Currently on Aricept for dementia.  Walking or climbing stairs? N  Dressing or bathing? N  Doing errands, shopping? N  Preparing Food and eating ? N  Using the Toilet? N  In the past six months, have you accidently leaked urine? N  Do you have problems with loss of bowel control? N  Managing your Medications? N  Managing your Finances? N  Housekeeping or  managing your Housekeeping? N  Some recent data might be hidden    Patient Care Team: Jerrol Banana., MD as PCP - General (Family Medicine) Vladimir Crofts, MD as Consulting Physician (Neurology)    Assessment:   This is a routine wellness examination for [place].  Exercise Activities and Dietary recommendations Current Exercise Habits: Home exercise routine, Time (Minutes): 30, Frequency (Times/Week): 3, Weekly Exercise (Minutes/Week): 90, Intensity: Moderate, Exercise limited by: None identified  Goals    . Increase water intake     Recommend increasing water intake to 3-4 glasses a day.       Fall Risk: Fall Risk  10/05/2018 10/03/2017 06/29/2016 06/26/2015  Falls in the past year? 0 No No No    FALL RISK PREVENTION PERTAINING TO THE HOME:  Any stairs in or around the home? Yes  If so, are there any without handrails? Yes, only two steps.   Home free of loose throw rugs in walkways, pet beds, electrical cords, etc? Yes  Adequate lighting in your home to reduce risk of falls? Yes   ASSISTIVE DEVICES UTILIZED TO PREVENT FALLS:  Life alert? No  Use of a cane, walker or w/c? No  Grab bars in the bathroom? No  Shower chair or bench in shower? No  Elevated toilet seat or a handicapped toilet? No    TIMED UP AND  GO:  Was the test performed? No .    Depression Screen PHQ 2/9 Scores 10/05/2018 08/21/2018 03/20/2018 01/10/2018  PHQ - 2 Score 0 0 0 0  PHQ- 9 Score - - 0 0     Cognitive Function MMSE - Mini Mental State Exam 08/21/2018 01/10/2018  Orientation to time 3 4  Orientation to Place 5 5  Registration 3 3  Attention/ Calculation 2 5  Recall 2 0  Language- name 2 objects 2 2  Language- repeat 0 1  Language- follow 3 step command 3 3  Language- read & follow direction 1 1  Write a sentence 1 1  Copy design 1 1  Total score 23 26     6CIT Screen 10/05/2018 06/29/2016  What Year? 0 points 0 points  What month? 0 points 0 points  What time? 0 points 0 points  Count back from 20 0 points 0 points  Months in reverse 0 points 0 points  Repeat phrase 10 points 2 points  Total Score 10 2    Immunization History  Administered Date(s) Administered  . Influenza, High Dose Seasonal PF 01/04/2015, 01/09/2016, 10/18/2016, 11/01/2017  . Pneumococcal Conjugate-13 06/26/2015  . Pneumococcal Polysaccharide-23 06/29/2016  . Tdap 10/17/2006    Qualifies for Shingles Vaccine? Yes . Due for Shingrix. Education has been provided regarding the importance of this vaccine. Pt has been advised to call insurance company to determine out of pocket expense. Advised may also receive vaccine at local pharmacy or Health Dept. Verbalized acceptance and understanding.  Tdap: Although this vaccine is not a covered service during a Wellness Exam, does the patient still wish to receive this vaccine today?  No .   Flu Vaccine: Due fall 2020  Pneumococcal Vaccine: Completed series  Screening Tests Health Maintenance  Topic Date Due  . TETANUS/TDAP  10/16/2016  . INFLUENZA VACCINE  09/23/2018  . MAMMOGRAM  10/21/2019  . COLONOSCOPY  06/29/2020  . DEXA SCAN  07/15/2020  . Hepatitis C Screening  Completed  . PNA vac Low Risk Adult  Completed    Cancer Screenings:  Colorectal Screening: Completed  06/30/15.  Repeat every 5 years.  Mammogram: Completed 10/28/17.   Bone Density: Completed 07/16/15. Results reflect OSTEOPENIA. Repeat every 5 years.   Lung Cancer Screening: (Low Dose CT Chest recommended if Age 62-80 years, 30 pack-year currently smoking OR have quit w/in 15years.) does not qualify.   Additional Screening:  Hepatitis C Screening: Up to date  Vision Screening: Recommended annual ophthalmology exams for early detection of glaucoma and other disorders of the eye.  Dental Screening: Recommended annual dental exams for proper oral hygiene  Community Resource Referral:  CRR required this visit?  No       Plan:  I have personally reviewed and addressed the Medicare Annual Wellness questionnaire and have noted the following in the patient's chart:  A. Medical and social history B. Use of alcohol, tobacco or illicit drugs  C. Current medications and supplements D. Functional ability and status E.  Nutritional status F.  Physical activity G. Advance directives H. List of other physicians I.  Hospitalizations, surgeries, and ER visits in previous 12 months J.  [place] such as hearing and vision if needed, cognitive and depression L. Referrals and appointments   In addition, I have reviewed and discussed with patient certain preventive protocols, quality metrics, and best practice recommendations. A written personalized care plan for preventive services as well as general preventive health recommendations were provided to patient. Nurse Health Advisor  Signed,    Joaquina Nissen [place], [place]  1/49/7026 Nurse Health Advisor   Nurse Notes: None.

## 2018-10-05 ENCOUNTER — Encounter: Payer: Medicare Other | Admitting: Family Medicine

## 2018-10-05 ENCOUNTER — Other Ambulatory Visit: Payer: Self-pay

## 2018-10-05 ENCOUNTER — Ambulatory Visit (INDEPENDENT_AMBULATORY_CARE_PROVIDER_SITE_OTHER): Payer: Medicare Other

## 2018-10-05 DIAGNOSIS — Z0000 Encounter for general adult medical examination without abnormal findings: Secondary | ICD-10-CM

## 2018-10-05 NOTE — Patient Instructions (Addendum)
Paige Baldwin , Thank you for taking time to come for your Medicare Wellness Visit. I appreciate your ongoing commitment to your health goals. Please review the following plan we discussed and let me know if I can assist you in the future.   Screening recommendations/referrals: Colonoscopy: Up to date, due 06/2020 Mammogram: Up to date, due 10/2019 Bone Density: Up to date, due 06/2020 Recommended yearly ophthalmology/optometry visit for glaucoma screening and checkup Recommended yearly dental visit for hygiene and checkup  Vaccinations: Influenza vaccine: Due fall 2020 Pneumococcal vaccine: Completed series Tdap vaccine: Pt declines today.  Shingles vaccine: Pt declines today.     Advanced directives: Please bring a copy of your POA (Power of Attorney) and/or Living Will to your next appointment.   Conditions/risks identified: Continue to increase water intake to 6-8 8 oz glasses a day as tolerated.   Next appointment: 2:00 PM today with Dr Rosanna Randy. Declined scheduling an AWV for 2021 at this time.    Preventive Care 74 Years and Older, Female Preventive care refers to lifestyle choices and visits with your health care provider that can promote health and wellness. What does preventive care include?  A yearly physical exam. This is also called an annual well check.  Dental exams once or twice a year.  Routine eye exams. Ask your health care provider how often you should have your eyes checked.  Personal lifestyle choices, including:  Daily care of your teeth and gums.  Regular physical activity.  Eating a healthy diet.  Avoiding tobacco and drug use.  Limiting alcohol use.  Practicing safe sex.  Taking low-dose aspirin every day.  Taking vitamin and mineral supplements as recommended by your health care provider. What happens during an annual well check? The services and screenings done by your health care provider during your annual well check will depend on your age,  overall health, lifestyle risk factors, and family history of disease. Counseling  Your health care provider may ask you questions about your:  Alcohol use.  Tobacco use.  Drug use.  Emotional well-being.  Home and relationship well-being.  Sexual activity.  Eating habits.  History of falls.  Memory and ability to understand (cognition).  Work and work Statistician.  Reproductive health. Screening  You may have the following tests or measurements:  Height, weight, and BMI.  Blood pressure.  Lipid and cholesterol levels. These may be checked every 5 years, or more frequently if you are over 108 years old.  Skin check.  Lung cancer screening. You may have this screening every year starting at age 97 if you have a 30-pack-year history of smoking and currently smoke or have quit within the past 15 years.  Fecal occult blood test (FOBT) of the stool. You may have this test every year starting at age 34.  Flexible sigmoidoscopy or colonoscopy. You may have a sigmoidoscopy every 5 years or a colonoscopy every 10 years starting at age 64.  Hepatitis C blood test.  Hepatitis B blood test.  Sexually transmitted disease (STD) testing.  Diabetes screening. This is done by checking your blood sugar (glucose) after you have not eaten for a while (fasting). You may have this done every 1-3 years.  Bone density scan. This is done to screen for osteoporosis. You may have this done starting at age 53.  Mammogram. This may be done every 1-2 years. Talk to your health care provider about how often you should have regular mammograms. Talk with your health care provider about your  test results, treatment options, and if necessary, the need for more tests. Vaccines  Your health care provider may recommend certain vaccines, such as:  Influenza vaccine. This is recommended every year.  Tetanus, diphtheria, and acellular pertussis (Tdap, Td) vaccine. You may need a Td booster every 10  years.  Zoster vaccine. You may need this after age 33.  Pneumococcal 13-valent conjugate (PCV13) vaccine. One dose is recommended after age 60.  Pneumococcal polysaccharide (PPSV23) vaccine. One dose is recommended after age 61. Talk to your health care provider about which screenings and vaccines you need and how often you need them. This information is not intended to replace advice given to you by your health care provider. Make sure you discuss any questions you have with your health care provider. Document Released: 03/07/2015 Document Revised: 10/29/2015 Document Reviewed: 12/10/2014 Elsevier Interactive Patient Education  2017 [place] Prevention in the Home Falls can cause injuries. They can happen to people of all ages. There are many things you can do to make your home safe and to help prevent falls. What can I do on the outside of my home?  Regularly fix the edges of walkways and driveways and fix any cracks.  Remove anything that might make you trip as you walk through a door, such as a raised step or threshold.  Trim any bushes or trees on the path to your home.  Use bright outdoor lighting.  Clear any walking paths of anything that might make someone trip, such as rocks or tools.  Regularly check to see if handrails are loose or broken. Make sure that both sides of any steps have handrails.  Any raised decks and porches should have guardrails on the edges.  Have any leaves, snow, or ice cleared regularly.  Use sand or salt on walking paths during winter.  Clean up any spills in your garage right away. This includes oil or grease spills. What can I do in the bathroom?  Use night lights.  Install grab bars by the toilet and in the tub and shower. Do not use towel bars as grab bars.  Use non-skid mats or decals in the tub or shower.  If you need to sit down in the shower, use a plastic, non-slip stool.  Keep the floor dry. Clean up any water that  spills on the floor as soon as it happens.  Remove soap buildup in the tub or shower regularly.  Attach bath mats securely with double-sided non-slip rug tape.  Do not have throw rugs and other things on the floor that can make you trip. What can I do in the bedroom?  Use night lights.  Make sure that you have a light by your bed that is easy to reach.  Do not use any sheets or blankets that are too big for your bed. They should not hang down onto the floor.  Have a firm chair that has side arms. You can use this for support while you get dressed.  Do not have throw rugs and other things on the floor that can make you trip. What can I do in the kitchen?  Clean up any spills right away.  Avoid walking on wet floors.  Keep items that you use a lot in easy-to-reach places.  If you need to reach something above you, use a strong step stool that has a grab bar.  Keep electrical cords out of the way.  Do not use floor polish or wax that  makes floors slippery. If you must use wax, use non-skid floor wax.  Do not have throw rugs and other things on the floor that can make you trip. What can I do with my stairs?  Do not leave any items on the stairs.  Make sure that there are handrails on both sides of the stairs and use them. Fix handrails that are broken or loose. Make sure that handrails are as long as the stairways.  Check any carpeting to make sure that it is firmly attached to the stairs. Fix any carpet that is loose or worn.  Avoid having throw rugs at the top or bottom of the stairs. If you do have throw rugs, attach them to the floor with carpet tape.  Make sure that you have a light switch at the top of the stairs and the bottom of the stairs. If you do not have them, ask someone to add them for you. What else can I do to help prevent falls?  Wear shoes that:  Do not have high heels.  Have rubber bottoms.  Are comfortable and fit you well.  Are closed at the  toe. Do not wear sandals.  If you use a stepladder:  Make sure that it is fully opened. Do not climb a closed stepladder.  Make sure that both sides of the stepladder are locked into place.  Ask someone to hold it for you, if possible.  Clearly mark and make sure that you can see:  Any grab bars or handrails.  First and last steps.  Where the edge of each step is.  Use tools that help you move around (mobility aids) if they are needed. These include:  Canes.  Walkers.  Scooters.  Crutches.  Turn on the lights when you go into a dark area. Replace any light bulbs as soon as they burn out.  Set up your furniture so you have a clear path. Avoid moving your furniture around.  If any of your floors are uneven, fix them.  If there are any pets around you, be aware of where they are.  Review your medicines with your doctor. Some medicines can make you feel dizzy. This can increase your chance of falling. Ask your doctor what other things that you can do to help prevent falls. This information is not intended to replace advice given to you by your health care provider. Make sure you discuss any questions you have with your health care provider. Document Released: 12/05/2008 Document Revised: 07/17/2015 Document Reviewed: 03/15/2014 Elsevier Interactive Patient Education  2017 [employer].

## 2018-10-18 DIAGNOSIS — G309 Alzheimer's disease, unspecified: Secondary | ICD-10-CM | POA: Diagnosis not present

## 2018-10-18 DIAGNOSIS — F0150 Vascular dementia without behavioral disturbance: Secondary | ICD-10-CM | POA: Diagnosis not present

## 2018-10-18 DIAGNOSIS — F0280 Dementia in other diseases classified elsewhere without behavioral disturbance: Secondary | ICD-10-CM | POA: Diagnosis not present

## 2018-10-18 DIAGNOSIS — F418 Other specified anxiety disorders: Secondary | ICD-10-CM | POA: Diagnosis not present

## 2018-11-22 ENCOUNTER — Encounter: Payer: Self-pay | Admitting: Family Medicine

## 2018-11-22 ENCOUNTER — Ambulatory Visit (INDEPENDENT_AMBULATORY_CARE_PROVIDER_SITE_OTHER): Payer: Medicare Other | Admitting: Family Medicine

## 2018-11-22 ENCOUNTER — Other Ambulatory Visit: Payer: Self-pay

## 2018-11-22 VITALS — BP 110/78 | HR 71 | Temp 96.8°F | Resp 16 | Ht 59.0 in | Wt 135.0 lb

## 2018-11-22 DIAGNOSIS — G3184 Mild cognitive impairment, so stated: Secondary | ICD-10-CM | POA: Diagnosis not present

## 2018-11-22 DIAGNOSIS — E039 Hypothyroidism, unspecified: Secondary | ICD-10-CM | POA: Diagnosis not present

## 2018-11-22 DIAGNOSIS — I10 Essential (primary) hypertension: Secondary | ICD-10-CM

## 2018-11-22 DIAGNOSIS — K219 Gastro-esophageal reflux disease without esophagitis: Secondary | ICD-10-CM

## 2018-11-22 DIAGNOSIS — F320 Major depressive disorder, single episode, mild: Secondary | ICD-10-CM | POA: Diagnosis not present

## 2018-11-22 DIAGNOSIS — Z23 Encounter for immunization: Secondary | ICD-10-CM | POA: Diagnosis not present

## 2018-11-22 NOTE — Progress Notes (Signed)
Patient: Paige Baldwin Female    DOB: [birth year]   73 y.o.   MRN: [id number] Visit Date: 11/22/2018  Today's Provider: [name], MD   Chief Complaint  Patient presents with  . Follow-up  . Anxiety  . Hypothyroidism   Subjective:     HPI   Late onset Alzheimer's disease without behavioral disturbance ([place]) From 08/21/2018- increased Donepezil dose to 10mg .  Anxiety From 08/21/2018-Fairly stable.  Essential hypertension From 08/21/2018-no changes. Controlled on Norvasc and Metoprolol.  Adult hypothyroidism From 08/21/2018-no changes Overall everything is stable and she is feeling well.  She has no complaints today and she is taking her medication as prescribed. No Known Allergies   Current Outpatient Medications:  .  amLODipine (NORVASC) 10 MG tablet, TAKE ONE TABLET BY MOUTH EVERY DAY, Disp: 30 tablet, Rfl: 12 .  Cholecalciferol (VITAMIN D-3) 5000 units TABS, Take by mouth daily., Disp: , Rfl:  .  levothyroxine (SYNTHROID) 100 MCG tablet, TAKE 1 TABLET (100 MCG TOTAL) BY MOUTH DAILY BEFORE BREAKFAST., Disp: 90 tablet, Rfl: 3 .  losartan (COZAAR) 100 MG tablet, TAKE 1 TABLET BY MOUTH EVERY DAY, Disp: 90 tablet, Rfl: 3 .  meloxicam (MOBIC) 7.5 MG tablet, TAKE 1 TABLET BY MOUTH EVERY DAY, Disp: 30 tablet, Rfl: 2 .  metoprolol tartrate (LOPRESSOR) 50 MG tablet, TAKE ONE TABLET BY MOUTH EVERY DAY, Disp: 30 tablet, Rfl: 12 .  NON FORMULARY, brain elevate 1-2 capsules daily, Disp: , Rfl:  .  NON FORMULARY, Total Restore - 1 capsule daily, Disp: , Rfl:  .  potassium chloride (K-DUR,KLOR-CON) 10 MEQ tablet, TAKE ONE TABLET BY MOUTH EVERY DAY, Disp: 30 tablet, Rfl: 12 .  sertraline (ZOLOFT) 100 MG tablet, Take 1.5 tablets (150 mg total) by mouth daily., Disp: 135 tablet, Rfl: 3 .  COLOSTRUM PO, Take by mouth daily. , Disp: , Rfl:  .  donepezil (ARICEPT) 10 MG tablet, Take 1 tablet (10 mg total) by mouth at bedtime. (Patient not taking: Reported on 11/22/2018), Disp:  90 tablet, Rfl: 1 .  isosorbide mononitrate (IMDUR) 30 MG 24 hr tablet, Take 1 tablet (30 mg total) by mouth every morning. (Patient not taking: Reported on 11/22/2018), Disp: 30 tablet, Rfl: 12 .  levothyroxine (SYNTHROID, LEVOTHROID) 25 MCG tablet, Take 1 tablet (25 mcg total) by mouth daily before breakfast. (Patient not taking: Reported on 10/05/2018), Disp: 30 tablet, Rfl: 11 .  Magnesium Malate 1250 (141.7 Mg) MG TABS, Take 0.5 tablets by mouth daily. , Disp: , Rfl:  .  omeprazole (PRILOSEC) 40 MG capsule, Take 40 mg by mouth daily., Disp: , Rfl:  .  pantoprazole (PROTONIX) 40 MG tablet, TAKE 1 TABLET BY MOUTH EVERY DAY (Patient not taking: Reported on 11/22/2018), Disp: 90 tablet, Rfl: 1 .  PAPAYA ENZYME PO, Take 1 tablet by mouth 3 (three) times daily. , Disp: , Rfl:  .  Probiotic Product (PROBIOTIC-10 PO), Take 1 capsule by mouth daily., Disp: , Rfl:  .  rosuvastatin (CRESTOR) 10 MG tablet, Take 1 tablet (10 mg total) by mouth at bedtime. (Patient not taking: Reported on 10/05/2018), Disp: 30 tablet, Rfl: 12 .  sertraline (ZOLOFT) 100 MG tablet, Take 1.5 tablets by mouth daily., Disp: , Rfl:   Review of Systems  Constitutional: Negative for appetite change, chills, fatigue and fever.  HENT: Negative.   Eyes: Negative.   Respiratory: Negative for chest tightness and shortness of breath.   Cardiovascular: Negative for chest pain and palpitations.  Gastrointestinal: Negative for abdominal pain, nausea and vomiting.  Endocrine: Negative.   Allergic/Immunologic: Negative.   Neurological: Negative for dizziness and weakness.  Hematological: Negative.   Psychiatric/Behavioral: Negative.     Social History   Tobacco Use  . Smoking status: Never Smoker  . Smokeless tobacco: Never Used  Substance Use Topics  . Alcohol use: No      Objective:   BP 110/78 (BP Location: Left Arm, Patient Position: Sitting, Cuff Size: Large)   Pulse 71   Temp (!) 96.8 F (36 C) (Other (Comment))   Resp  16   Ht 4\' 11"  (1.499 m)   Wt 135 lb (61.2 kg)   LMP  (LMP Unknown)   SpO2 97%   BMI 27.27 kg/m  Vitals:   11/22/18 1140  BP: 110/78  Pulse: 71  Resp: 16  Temp: (!) 96.8 F (36 C)  TempSrc: Other (Comment)  SpO2: 97%  Weight: 135 lb (61.2 kg)  Height: 4\' 11"  (1.499 m)  Body mass index is 27.27 kg/m.   Physical Exam Vitals signs reviewed.  Constitutional:      Appearance: Normal appearance. She is well-developed and normal weight.  HENT:     Head: Normocephalic and atraumatic.     Right Ear: External ear normal.     Left Ear: Tympanic membrane and external ear normal.     Nose: Nose normal.     Mouth/Throat:     Pharynx: Oropharynx is clear.  Eyes:     General: No scleral icterus.    Conjunctiva/sclera: Conjunctivae normal.  Neck:     Thyroid: No thyromegaly.  Cardiovascular:     Rate and Rhythm: Normal rate and regular rhythm.     Heart sounds: Normal heart sounds.  Pulmonary:     Effort: Pulmonary effort is normal.     Breath sounds: Normal breath sounds.  Abdominal:     Palpations: Abdomen is soft.  Musculoskeletal:     Right lower leg: No edema.     Left lower leg: No edema.  Lymphadenopathy:     Cervical: No cervical adenopathy.  Skin:    General: Skin is warm and dry.  Neurological:     General: No focal deficit present.     Mental Status: She is alert and oriented to person, place, and time. Mental status is at baseline.  Psychiatric:        Mood and Affect: Mood normal.        Behavior: Behavior normal.        Thought Content: Thought content normal.        Judgment: Judgment normal.      No results found for any visits on 11/22/18.     Assessment & Plan    1. MCI (mild cognitive impairment) Possible early Alzheimer's disease without behavioral disturbance.  On Aricept 10 mg daily - CBC w/Diff/Platelet - TSH - Comprehensive Metabolic Panel (CMET)  2. Need for influenza vaccination  - Flu Vaccine QUAD High Dose(Fluad)  3. Essential  hypertension  - CBC w/Diff/Platelet - TSH - Comprehensive Metabolic Panel (CMET)  4. Adult hypothyroidism Follow-up TSH - CBC w/Diff/Platelet - TSH - Comprehensive Metabolic Panel (CMET)  5. Mild major depression (HCC) In remission. - CBC w/Diff/Platelet - TSH - Comprehensive Metabolic Panel (CMET)  6. Gastroesophageal reflux disease, esophagitis presence not specified Symptoms controlled.  Return to clinic 6 months. - CBC w/Diff/Platelet - TSH - Comprehensive Metabolic Panel (CMET)     [name], MD  [facility]  [facility]  

## 2018-11-22 NOTE — Patient Instructions (Addendum)
1. Late onset Alzheimer's disease without behavioral disturbance ([place]) -CBC -TSH -METC  2.Anxiety -CBC -TSH -METC  3. Essential hypertension -CBC -TSH -METC  4. Adult hypothyroidism -CBC -TSH -METC  -influenza vaccination  Follow-up 6 months.

## 2018-11-23 LAB — CBC WITH DIFFERENTIAL/PLATELET
Basophils Absolute: 0 10*3/uL (ref 0.0–0.2)
Basos: 0 %
EOS (ABSOLUTE): 0 10*3/uL (ref 0.0–0.4)
Eos: 1 %
Hematocrit: 39.8 % (ref 34.0–46.6)
Hemoglobin: 13.1 g/dL (ref 11.1–15.9)
Immature Grans (Abs): 0 10*3/uL (ref 0.0–0.1)
Immature Granulocytes: 0 %
Lymphocytes Absolute: 1.8 10*3/uL (ref 0.7–3.1)
Lymphs: 28 %
MCH: 28.7 pg (ref 26.6–33.0)
MCHC: 32.9 g/dL (ref 31.5–35.7)
MCV: 87 fL (ref 79–97)
Monocytes Absolute: 1.5 10*3/uL — ABNORMAL HIGH (ref 0.1–0.9)
Monocytes: 24 %
Neutrophils Absolute: 2.9 10*3/uL (ref 1.4–7.0)
Neutrophils: 47 %
Platelets: 182 10*3/uL (ref 150–450)
RBC: 4.57 x10E6/uL (ref 3.77–5.28)
RDW: 13.1 % (ref 11.7–15.4)
WBC: 6.2 10*3/uL (ref 3.4–10.8)

## 2018-11-23 LAB — COMPREHENSIVE METABOLIC PANEL
ALT: 16 IU/L (ref 0–32)
AST: 20 IU/L (ref 0–40)
Albumin/Globulin Ratio: 1.6 (ref 1.2–2.2)
Albumin: 4.4 g/dL (ref 3.7–4.7)
Alkaline Phosphatase: 112 IU/L (ref 39–117)
BUN/Creatinine Ratio: 12 (ref 12–28)
BUN: 11 mg/dL (ref 8–27)
Bilirubin Total: 0.4 mg/dL (ref 0.0–1.2)
CO2: 24 mmol/L (ref 20–29)
Calcium: 9.5 mg/dL (ref 8.7–10.3)
Chloride: 103 mmol/L (ref 96–106)
Creatinine, Ser: 0.92 mg/dL (ref 0.57–1.00)
GFR calc Af Amer: 71 mL/min/{1.73_m2} (ref >59)
GFR calc non Af Amer: 62 mL/min/{1.73_m2} (ref >59)
Globulin, Total: 2.7 g/dL (ref 1.5–4.5)
Glucose: 97 mg/dL (ref 65–99)
Potassium: 4.2 mmol/L (ref 3.5–5.2)
Sodium: 141 mmol/L (ref 134–144)
Total Protein: 7.1 g/dL (ref 6.0–8.5)

## 2018-11-23 LAB — TSH: TSH: 5.81 u[IU]/mL — ABNORMAL HIGH (ref 0.450–4.500)

## 2018-11-27 ENCOUNTER — Telehealth: Payer: Self-pay

## 2018-11-27 NOTE — Telephone Encounter (Signed)
-----   Message from Jerrol Banana., MD sent at 11/26/2018  9:15 AM EDT ----- Labs ok

## 2018-11-27 NOTE — Telephone Encounter (Signed)
Pt advised.   Thanks,   -Kingston Guiles  

## 2019-02-05 ENCOUNTER — Ambulatory Visit (INDEPENDENT_AMBULATORY_CARE_PROVIDER_SITE_OTHER): Payer: Medicare Other | Admitting: Family Medicine

## 2019-02-05 ENCOUNTER — Other Ambulatory Visit: Payer: Self-pay

## 2019-02-05 VITALS — BP 176/89 | HR 88 | Temp 97.3°F | Resp 16 | Ht 60.0 in | Wt 138.2 lb

## 2019-02-05 DIAGNOSIS — W19XXXA Unspecified fall, initial encounter: Secondary | ICD-10-CM | POA: Diagnosis not present

## 2019-02-05 DIAGNOSIS — M7072 Other bursitis of hip, left hip: Secondary | ICD-10-CM | POA: Diagnosis not present

## 2019-02-05 DIAGNOSIS — M7071 Other bursitis of hip, right hip: Secondary | ICD-10-CM | POA: Diagnosis not present

## 2019-02-05 DIAGNOSIS — G3184 Mild cognitive impairment, so stated: Secondary | ICD-10-CM

## 2019-02-05 MED ORDER — METHYLPREDNISOLONE ACETATE 80 MG/ML IJ SUSP
80.0000 mg | Freq: Once | INTRAMUSCULAR | Status: AC
  Administered 2019-02-05: 80 mg via INTRAMUSCULAR

## 2019-02-05 NOTE — Progress Notes (Signed)
Patient: Paige Baldwin Female    DOB: 1945/10/02   73 y.o.   MRN: [id number] Visit Date: 02/05/2019  Today's Provider: [name], MD   Chief Complaint  Patient presents with  . Sciatica   Subjective:     Leg Pain  There was no injury mechanism. The pain is present in the left leg, left hip and left thigh. The pain is at a severity of 4/10. The pain is moderate. The pain has been intermittent since onset. Associated symptoms comments: pain. The symptoms are aggravated by movement.  The pain is worse when she rolls over on her left hip at night.  No Known Allergies   Current Outpatient Medications:  .  amLODipine (NORVASC) 10 MG tablet, TAKE ONE TABLET BY MOUTH EVERY DAY, Disp: 30 tablet, Rfl: 12 .  Cholecalciferol (VITAMIN D-3) 5000 units TABS, Take by mouth daily., Disp: , Rfl:  .  COLOSTRUM PO, Take by mouth daily. , Disp: , Rfl:  .  donepezil (ARICEPT) 10 MG tablet, Take 1 tablet (10 mg total) by mouth at bedtime. (Patient not taking: Reported on 11/22/2018), Disp: 90 tablet, Rfl: 1 .  isosorbide mononitrate (IMDUR) 30 MG 24 hr tablet, Take 1 tablet (30 mg total) by mouth every morning. (Patient not taking: Reported on 11/22/2018), Disp: 30 tablet, Rfl: 12 .  levothyroxine (SYNTHROID) 100 MCG tablet, TAKE 1 TABLET (100 MCG TOTAL) BY MOUTH DAILY BEFORE BREAKFAST., Disp: 90 tablet, Rfl: 3 .  levothyroxine (SYNTHROID, LEVOTHROID) 25 MCG tablet, Take 1 tablet (25 mcg total) by mouth daily before breakfast. (Patient not taking: Reported on 10/05/2018), Disp: 30 tablet, Rfl: 11 .  losartan (COZAAR) 100 MG tablet, TAKE 1 TABLET BY MOUTH EVERY DAY, Disp: 90 tablet, Rfl: 3 .  Magnesium Malate 1250 (141.7 Mg) MG TABS, Take 0.5 tablets by mouth daily. , Disp: , Rfl:  .  meloxicam (MOBIC) 7.5 MG tablet, TAKE 1 TABLET BY MOUTH EVERY DAY, Disp: 30 tablet, Rfl: 2 .  metoprolol tartrate (LOPRESSOR) 50 MG tablet, TAKE ONE TABLET BY MOUTH EVERY DAY, Disp: 30 tablet, Rfl: 12 .  NON  FORMULARY, brain elevate 1-2 capsules daily, Disp: , Rfl:  .  NON FORMULARY, Total Restore - 1 capsule daily, Disp: , Rfl:  .  omeprazole (PRILOSEC) 40 MG capsule, Take 40 mg by mouth daily., Disp: , Rfl:  .  pantoprazole (PROTONIX) 40 MG tablet, TAKE 1 TABLET BY MOUTH EVERY DAY (Patient not taking: Reported on 11/22/2018), Disp: 90 tablet, Rfl: 1 .  PAPAYA ENZYME PO, Take 1 tablet by mouth 3 (three) times daily. , Disp: , Rfl:  .  potassium chloride (K-DUR,KLOR-CON) 10 MEQ tablet, TAKE ONE TABLET BY MOUTH EVERY DAY, Disp: 30 tablet, Rfl: 12 .  Probiotic Product (PROBIOTIC-10 PO), Take 1 capsule by mouth daily., Disp: , Rfl:  .  rosuvastatin (CRESTOR) 10 MG tablet, Take 1 tablet (10 mg total) by mouth at bedtime. (Patient not taking: Reported on 10/05/2018), Disp: 30 tablet, Rfl: 12 .  sertraline (ZOLOFT) 100 MG tablet, Take 1.5 tablets by mouth daily., Disp: , Rfl:  .  sertraline (ZOLOFT) 100 MG tablet, Take 1.5 tablets (150 mg total) by mouth daily., Disp: 135 tablet, Rfl: 3  Review of Systems  Constitutional: Negative.   HENT: Negative.   Eyes: Negative.   Respiratory: Negative.   Cardiovascular: Negative.   Gastrointestinal: Negative.   Endocrine: Negative.   Genitourinary: Negative.   Musculoskeletal: Positive for arthralgias, gait problem and myalgias.  Skin: Negative.   Allergic/Immunologic: Negative.   Hematological: Negative.   Psychiatric/Behavioral: Negative.     Social History   Tobacco Use  . Smoking status: Never Smoker  . Smokeless tobacco: Never Used  Substance Use Topics  . Alcohol use: No      Objective:   BP (!) 176/89   Pulse 88   Temp (!) 97.3 F (36.3 C) (Temporal)   Resp 16   Ht 5' (1.524 m)   Wt 138 lb 3.2 oz (62.7 kg)   LMP  (LMP Unknown)   SpO2 97%   BMI 26.99 kg/m  Vitals:   02/05/19 1612  BP: (!) 176/89  Pulse: 88  Resp: 16  Temp: (!) 97.3 F (36.3 C)  TempSrc: Temporal  SpO2: 97%  Weight: 138 lb 3.2 oz (62.7 kg)  Height: 5' (1.524  m)  Body mass index is 26.99 kg/m.   Physical Exam Vitals reviewed.  Constitutional:      Appearance: Normal appearance. She is well-developed and normal weight.  HENT:     Head: Normocephalic and atraumatic.     Right Ear: External ear normal.     Left Ear: Tympanic membrane and external ear normal.     Nose: Nose normal.     Mouth/Throat:     Pharynx: Oropharynx is clear.  Eyes:     General: No scleral icterus.    Conjunctiva/sclera: Conjunctivae normal.  Neck:     Thyroid: No thyromegaly.  Cardiovascular:     Rate and Rhythm: Normal rate and regular rhythm.     Heart sounds: Normal heart sounds.  Pulmonary:     Effort: Pulmonary effort is normal.     Breath sounds: Normal breath sounds.  Abdominal:     Palpations: Abdomen is soft.  Musculoskeletal:     Right lower leg: No edema.     Left lower leg: No edema.     Comments: Straight leg raise is negative bilaterally and figure-of-four is normal.  She does have tenderness over the left greater trochanter  Lymphadenopathy:     Cervical: No cervical adenopathy.  Skin:    General: Skin is warm and dry.  Neurological:     General: No focal deficit present.     Mental Status: She is alert and oriented to person, place, and time. Mental status is at baseline.  Psychiatric:        Mood and Affect: Mood normal.        Behavior: Behavior normal.        Thought Content: Thought content normal.        Judgment: Judgment normal.      No results found for any visits on 02/05/19.     Assessment & Plan    1. Bursitis of left  hips, Trochanteric bursitis  - methylPREDNISolone acetate (DEPO-MEDROL) injection 80 mg  2. Fall, initial encounter  - DG Hip Unilat W OR W/O Pelvis 2-3 Views Left  3.MCI  Richard Cranford Mon, MD  [facility]

## 2019-02-06 ENCOUNTER — Ambulatory Visit
Admission: RE | Admit: 2019-02-06 | Discharge: 2019-02-06 | Disposition: A | Discharge status: Home / Self Care | Payer: Medicare Other | Source: Ambulatory Visit | Attending: Family Medicine | Admitting: Family Medicine

## 2019-02-06 ENCOUNTER — Other Ambulatory Visit: Payer: Self-pay

## 2019-02-06 DIAGNOSIS — M25552 Pain in left hip: Secondary | ICD-10-CM | POA: Diagnosis present

## 2019-02-06 DIAGNOSIS — W19XXXA Unspecified fall, initial encounter: Secondary | ICD-10-CM | POA: Insufficient documentation

## 2019-02-08 ENCOUNTER — Telehealth: Payer: Self-pay

## 2019-02-08 NOTE — Telephone Encounter (Signed)
-----   Message from Jerrol Banana., MD sent at 02/06/2019  5:42 PM EST ----- Normal.x ray

## 2019-02-08 NOTE — Telephone Encounter (Signed)
Called and spoke with the patient and notified of normal xray results.

## 2019-04-20 DIAGNOSIS — G309 Alzheimer's disease, unspecified: Secondary | ICD-10-CM | POA: Diagnosis not present

## 2019-04-20 DIAGNOSIS — F418 Other specified anxiety disorders: Secondary | ICD-10-CM | POA: Diagnosis not present

## 2019-04-20 DIAGNOSIS — F0280 Dementia in other diseases classified elsewhere without behavioral disturbance: Secondary | ICD-10-CM | POA: Diagnosis not present

## 2019-04-20 DIAGNOSIS — F0150 Vascular dementia without behavioral disturbance: Secondary | ICD-10-CM | POA: Diagnosis not present

## 2019-04-20 NOTE — Progress Notes (Deleted)
Patient: Paige Baldwin Female    DOB: Dec 04, 1945   74 y.o.   MRN: [id number] Visit Date: 04/20/2019  Today's Provider: [name], MD   No chief complaint on file.  Subjective:     HPI   MCI (mild cognitive impairment) From 11/22/2018-Possible early Alzheimer's disease without behavioral disturbance.  On Aricept 10 mg daily  Essential hypertension From 11/22/2018-labs checked showing-okay.  Adult hypothyroidism From 11/22/2018-labs checked showing-okay.  Mild major depression ([place]) From 11/22/2018-labs checked showing-okay. In remission.  Gastroesophageal reflux disease, esophagitis presence not specified From 11/22/2018-Symptoms controlled. Labs checked showing-okay.  No Known Allergies   Current Outpatient Medications:  .  amLODipine (NORVASC) 10 MG tablet, TAKE ONE TABLET BY MOUTH EVERY DAY, Disp: 30 tablet, Rfl: 12 .  Cholecalciferol (VITAMIN D-3) 5000 units TABS, Take by mouth daily., Disp: , Rfl:  .  COLOSTRUM PO, Take by mouth daily. , Disp: , Rfl:  .  donepezil (ARICEPT) 10 MG tablet, Take 1 tablet (10 mg total) by mouth at bedtime. (Patient not taking: Reported on 11/22/2018), Disp: 90 tablet, Rfl: 1 .  isosorbide mononitrate (IMDUR) 30 MG 24 hr tablet, Take 1 tablet (30 mg total) by mouth every morning. (Patient not taking: Reported on 11/22/2018), Disp: 30 tablet, Rfl: 12 .  levothyroxine (SYNTHROID) 100 MCG tablet, TAKE 1 TABLET (100 MCG TOTAL) BY MOUTH DAILY BEFORE BREAKFAST., Disp: 90 tablet, Rfl: 3 .  levothyroxine (SYNTHROID, LEVOTHROID) 25 MCG tablet, Take 1 tablet (25 mcg total) by mouth daily before breakfast. (Patient not taking: Reported on 10/05/2018), Disp: 30 tablet, Rfl: 11 .  losartan (COZAAR) 100 MG tablet, TAKE 1 TABLET BY MOUTH EVERY DAY, Disp: 90 tablet, Rfl: 3 .  Magnesium Malate 1250 (141.7 Mg) MG TABS, Take 0.5 tablets by mouth daily. , Disp: , Rfl:  .  meloxicam (MOBIC) 7.5 MG tablet, TAKE 1 TABLET BY MOUTH EVERY DAY, Disp: 30  tablet, Rfl: 2 .  metoprolol tartrate (LOPRESSOR) 50 MG tablet, TAKE ONE TABLET BY MOUTH EVERY DAY, Disp: 30 tablet, Rfl: 12 .  NON FORMULARY, brain elevate 1-2 capsules daily, Disp: , Rfl:  .  NON FORMULARY, Total Restore - 1 capsule daily, Disp: , Rfl:  .  omeprazole (PRILOSEC) 40 MG capsule, Take 40 mg by mouth daily., Disp: , Rfl:  .  pantoprazole (PROTONIX) 40 MG tablet, TAKE 1 TABLET BY MOUTH EVERY DAY (Patient not taking: Reported on 11/22/2018), Disp: 90 tablet, Rfl: 1 .  PAPAYA ENZYME PO, Take 1 tablet by mouth 3 (three) times daily. , Disp: , Rfl:  .  potassium chloride (K-DUR,KLOR-CON) 10 MEQ tablet, TAKE ONE TABLET BY MOUTH EVERY DAY, Disp: 30 tablet, Rfl: 12 .  Probiotic Product (PROBIOTIC-10 PO), Take 1 capsule by mouth daily., Disp: , Rfl:  .  rosuvastatin (CRESTOR) 10 MG tablet, Take 1 tablet (10 mg total) by mouth at bedtime. (Patient not taking: Reported on 10/05/2018), Disp: 30 tablet, Rfl: 12 .  sertraline (ZOLOFT) 100 MG tablet, Take 1.5 tablets by mouth daily., Disp: , Rfl:  .  sertraline (ZOLOFT) 100 MG tablet, Take 1.5 tablets (150 mg total) by mouth daily., Disp: 135 tablet, Rfl: 3  Review of Systems  Constitutional: Negative for appetite change, chills, fatigue and fever.  Respiratory: Negative for chest tightness and shortness of breath.   Cardiovascular: Negative for chest pain and palpitations.  Gastrointestinal: Negative for abdominal pain, nausea and vomiting.  Neurological: Negative for dizziness and weakness.    Social History  Tobacco Use  . Smoking status: Never Smoker  . Smokeless tobacco: Never Used  Substance Use Topics  . Alcohol use: No      Objective:   LMP  (LMP Unknown)  There were no vitals filed for this visit.There is no height or weight on file to calculate BMI.   Physical Exam   No results found for any visits on 04/23/19.     Assessment & Plan        [name], MD  [facility]

## 2019-04-23 ENCOUNTER — Ambulatory Visit: Payer: Self-pay | Admitting: Family Medicine

## 2019-04-24 ENCOUNTER — Other Ambulatory Visit: Payer: Self-pay

## 2019-04-24 ENCOUNTER — Ambulatory Visit (INDEPENDENT_AMBULATORY_CARE_PROVIDER_SITE_OTHER): Payer: Medicare Other | Admitting: Family Medicine

## 2019-04-24 ENCOUNTER — Encounter: Payer: Self-pay | Admitting: Family Medicine

## 2019-04-24 VITALS — BP 155/84 | HR 75 | Temp 96.9°F | Resp 16 | Ht 60.0 in | Wt 137.0 lb

## 2019-04-24 DIAGNOSIS — F0280 Dementia in other diseases classified elsewhere without behavioral disturbance: Secondary | ICD-10-CM

## 2019-04-24 DIAGNOSIS — F419 Anxiety disorder, unspecified: Secondary | ICD-10-CM

## 2019-04-24 DIAGNOSIS — I10 Essential (primary) hypertension: Secondary | ICD-10-CM

## 2019-04-24 DIAGNOSIS — G301 Alzheimer's disease with late onset: Secondary | ICD-10-CM | POA: Diagnosis not present

## 2019-04-24 NOTE — Patient Instructions (Addendum)
Obtain a home blood pressure cuff, to check your blood pressures. [employer] or [employer]. Follow up in 3 months.

## 2019-04-24 NOTE — Progress Notes (Signed)
Patient: Paige Baldwin Female    DOB: [birth year]   74 y.o.   MRN: [id number] Visit Date: 04/24/2019  Today's Provider: [name], MD   Chief Complaint  Patient presents with  . Follow-up  . Hypertension  . Hypothyroidism  . Gastroesophageal Reflux  . Depression   Subjective:     HPI  She has had both Covid vaccines.  Overall she feels well and has no complaints.  She states her memory is stable. MCI (mild cognitive impairment) From 11/22/2019-Possible early Alzheimer's disease without behavioral disturbance.  On Aricept 10 mg daily  Essential hypertension From 11/22/2019-labs checked showing-okay.  Adult hypothyroidism From 11/22/2019-labs checked showing-okay.  Mild major depression ([place]) From 11/22/2019-In remission.   Gastroesophageal reflux disease, esophagitis presence not specified From 11/22/2019-Symptoms controlled.  Return to clinic 6 months.  No Known Allergies   Current Outpatient Medications:  .  amLODipine (NORVASC) 10 MG tablet, TAKE ONE TABLET BY MOUTH EVERY DAY, Disp: 30 tablet, Rfl: 12 .  Cholecalciferol (VITAMIN D-3) 5000 units TABS, Take by mouth daily., Disp: , Rfl:  .  levothyroxine (SYNTHROID) 100 MCG tablet, TAKE 1 TABLET (100 MCG TOTAL) BY MOUTH DAILY BEFORE BREAKFAST., Disp: 90 tablet, Rfl: 3 .  losartan (COZAAR) 100 MG tablet, TAKE 1 TABLET BY MOUTH EVERY DAY, Disp: 90 tablet, Rfl: 3 .  meloxicam (MOBIC) 7.5 MG tablet, TAKE 1 TABLET BY MOUTH EVERY DAY, Disp: 30 tablet, Rfl: 2 .  metoprolol tartrate (LOPRESSOR) 50 MG tablet, TAKE ONE TABLET BY MOUTH EVERY DAY, Disp: 30 tablet, Rfl: 12 .  NON FORMULARY, brain elevate 1-2 capsules daily, Disp: , Rfl:  .  omeprazole (PRILOSEC) 40 MG capsule, Take 40 mg by mouth daily., Disp: , Rfl:  .  potassium chloride (K-DUR,KLOR-CON) 10 MEQ tablet, TAKE ONE TABLET BY MOUTH EVERY DAY, Disp: 30 tablet, Rfl: 12 .  Probiotic Product (PROBIOTIC-10 PO), Take 1 capsule by mouth daily., Disp: , Rfl:  .   sertraline (ZOLOFT) 100 MG tablet, Take 1.5 tablets (150 mg total) by mouth daily., Disp: 135 tablet, Rfl: 3 .  COLOSTRUM PO, Take by mouth daily. , Disp: , Rfl:  .  donepezil (ARICEPT) 10 MG tablet, Take 1 tablet (10 mg total) by mouth at bedtime. (Patient not taking: Reported on 11/22/2018), Disp: 90 tablet, Rfl: 1 .  isosorbide mononitrate (IMDUR) 30 MG 24 hr tablet, Take 1 tablet (30 mg total) by mouth every morning. (Patient not taking: Reported on 11/22/2018), Disp: 30 tablet, Rfl: 12 .  levothyroxine (SYNTHROID, LEVOTHROID) 25 MCG tablet, Take 1 tablet (25 mcg total) by mouth daily before breakfast. (Patient not taking: Reported on 10/05/2018), Disp: 30 tablet, Rfl: 11 .  Magnesium Malate 1250 (141.7 Mg) MG TABS, Take 0.5 tablets by mouth daily. , Disp: , Rfl:  .  NON FORMULARY, Total Restore - 1 capsule daily, Disp: , Rfl:  .  pantoprazole (PROTONIX) 40 MG tablet, TAKE 1 TABLET BY MOUTH EVERY DAY (Patient not taking: Reported on 11/22/2018), Disp: 90 tablet, Rfl: 1 .  PAPAYA ENZYME PO, Take 1 tablet by mouth 3 (three) times daily. , Disp: , Rfl:  .  rosuvastatin (CRESTOR) 10 MG tablet, Take 1 tablet (10 mg total) by mouth at bedtime. (Patient not taking: Reported on 10/05/2018), Disp: 30 tablet, Rfl: 12 .  sertraline (ZOLOFT) 100 MG tablet, Take 1.5 tablets by mouth daily., Disp: , Rfl:   Review of Systems  Constitutional: Negative for appetite change, chills, fatigue and fever.  HENT: Negative.   Eyes: Negative.   Respiratory: Negative for chest tightness and shortness of breath.   Cardiovascular: Negative for chest pain and palpitations.  Gastrointestinal: Negative for abdominal pain, nausea and vomiting.  Endocrine: Negative.   Allergic/Immunologic: Negative.   Neurological: Negative for dizziness and weakness.  Psychiatric/Behavioral: Negative.     Social History   Tobacco Use  . Smoking status: Never Smoker  . Smokeless tobacco: Never Used  Substance Use Topics  . Alcohol use:  No      Objective:   BP (!) 155/84 (BP Location: Right Arm, Patient Position: Sitting, Cuff Size: Normal)   Pulse 75   Temp (!) 96.9 F (36.1 C) (Other (Comment))   Resp 16   Ht 5' (1.524 m)   Wt 137 lb (62.1 kg)   LMP  (LMP Unknown)   SpO2 98%   BMI 26.76 kg/m  Vitals:   04/24/19 1417  BP: (!) 155/84  Pulse: 75  Resp: 16  Temp: (!) 96.9 F (36.1 C)  TempSrc: Other (Comment)  SpO2: 98%  Weight: 137 lb (62.1 kg)  Height: 5' (1.524 m)  Body mass index is 26.76 kg/m.   Physical Exam Vitals reviewed.  Constitutional:      Appearance: Normal appearance. She is well-developed and normal weight.  HENT:     Head: Normocephalic and atraumatic.     Right Ear: External ear normal.     Left Ear: Tympanic membrane and external ear normal.     Nose: Nose normal.     Mouth/Throat:     Pharynx: Oropharynx is clear.  Eyes:     General: No scleral icterus.    Conjunctiva/sclera: Conjunctivae normal.  Neck:     Thyroid: No thyromegaly.  Cardiovascular:     Rate and Rhythm: Normal rate and regular rhythm.     Heart sounds: Normal heart sounds.  Pulmonary:     Effort: Pulmonary effort is normal.     Breath sounds: Normal breath sounds.  Abdominal:     Palpations: Abdomen is soft.  Musculoskeletal:     Right lower leg: No edema.     Left lower leg: No edema.  Lymphadenopathy:     Cervical: No cervical adenopathy.  Skin:    General: Skin is warm and dry.  Neurological:     General: No focal deficit present.     Mental Status: She is alert and oriented to person, place, and time. Mental status is at baseline.  Psychiatric:        Mood and Affect: Mood normal.        Behavior: Behavior normal.        Thought Content: Thought content normal.        Judgment: Judgment normal.      No results found for any visits on 04/24/19.     Assessment & Plan    1. Essential hypertension Obtain a home blood pressure cuff, to check your blood pressures. [employer] or [employer]. Follow up in 3 months.   2. Late onset Alzheimer's disease without behavioral disturbance (HCC) Continue Aricept 10 mg daily, MMSE on next visit  3. Anxiety Clinically stable.    I,Rafaela Dinius,acting as a scribe for [name], MD.,have documented all relevant documentation on the behalf of [name], MD,as directed by  [name], MD while in the presence of [name], MD.      [name], MD  [facility]

## 2019-07-30 NOTE — Progress Notes (Signed)
Established patient visit  I,[name],acting as a scribe for [name], MD.,have documented all relevant documentation on the behalf of [name], MD,as directed by  [name], MD while in the presence of [name], MD.   Patient: Paige Baldwin   DOB: 11/11/1945   73 y.o. Female  MRN: [id number] Visit Date: 07/31/2019  Today's healthcare provider: [name], MD   Chief Complaint  Patient presents with  . Follow-up  . Hypertension   Subjective    HPI  Pt comes in for routine f/u with her husband.  She says she has stopped all of her meds--"just did not want to take them anymore." She and her husbband both appear to be having cognitive problems today. They are cooperative. She did have some minor trauma to her left hand about 3 months ago and still has some pain over left thumb area. Hypertension, follow-up  BP Readings from Last 3 Encounters:  07/31/19 (!) 169/76  04/24/19 (!) 155/84  02/05/19 (!) 176/89   Wt Readings from Last 3 Encounters:  07/31/19 135 lb (61.2 kg)  04/24/19 137 lb (62.1 kg)  02/05/19 138 lb 3.2 oz (62.7 kg)     She was last seen for hypertension 3 months ago.  BP at that visit was 155/84. Management since that visit includes check blood pressure at home.  She reports good compliance with treatment. She is not having side effects. none She is following a Regular diet. She some walking exercising. She does not smoke.  Use of agents associated with hypertension: none.   Outside blood pressures are none.   Pertinent labs: Lab Results  Component Value Date   CHOL 172 03/03/2017   HDL 73 03/03/2017   LDLCALC 87 03/03/2017   TRIG 62 03/03/2017   CHOLHDL 3.7 12/16/2016   Lab Results  Component Value Date   NA 141 11/22/2018   K 4.2 11/22/2018   CREATININE 0.92 11/22/2018   GFRNONAA 62 11/22/2018   GFRAA 71 11/22/2018   GLUCOSE 97 11/22/2018     The 10-year ASCVD risk score Mikey Bussing DC  Jr., et al., 2013) is: 30.2%   --------------------------------------------------------------------  Late onset Alzheimer's disease without behavioral disturbance (HCC) From 04/24/2019-Continue Aricept 10 mg daily, MMSE on next visit.      Medications: Outpatient Medications Prior to Visit  Medication Sig  . amLODipine (NORVASC) 10 MG tablet TAKE ONE TABLET BY MOUTH EVERY DAY (Patient not taking: Reported on 07/31/2019)  . Cholecalciferol (VITAMIN D-3) 5000 units TABS Take by mouth daily.  . COLOSTRUM PO Take by mouth daily.   Marland Kitchen donepezil (ARICEPT) 10 MG tablet Take 1 tablet (10 mg total) by mouth at bedtime. (Patient not taking: Reported on 11/22/2018)  . isosorbide mononitrate (IMDUR) 30 MG 24 hr tablet Take 1 tablet (30 mg total) by mouth every morning. (Patient not taking: Reported on 11/22/2018)  . levothyroxine (SYNTHROID) 100 MCG tablet TAKE 1 TABLET (100 MCG TOTAL) BY MOUTH DAILY BEFORE BREAKFAST. (Patient not taking: Reported on 07/31/2019)  . levothyroxine (SYNTHROID, LEVOTHROID) 25 MCG tablet Take 1 tablet (25 mcg total) by mouth daily before breakfast. (Patient not taking: Reported on 10/05/2018)  . losartan (COZAAR) 100 MG tablet TAKE 1 TABLET BY MOUTH EVERY DAY (Patient not taking: Reported on 07/31/2019)  . Magnesium Malate 1250 (141.7 Mg) MG TABS Take 0.5 tablets by mouth daily.   . meloxicam (MOBIC) 7.5 MG tablet TAKE 1 TABLET BY MOUTH EVERY DAY (Patient not  taking: Reported on 07/31/2019)  . metoprolol tartrate (LOPRESSOR) 50 MG tablet TAKE ONE TABLET BY MOUTH EVERY DAY (Patient not taking: Reported on 07/31/2019)  . NON FORMULARY brain elevate 1-2 capsules daily  . NON FORMULARY Total Restore - 1 capsule daily  . omeprazole (PRILOSEC) 40 MG capsule Take 40 mg by mouth daily.  . pantoprazole (PROTONIX) 40 MG tablet TAKE 1 TABLET BY MOUTH EVERY DAY (Patient not taking: Reported on 11/22/2018)  . PAPAYA ENZYME PO Take 1 tablet by mouth 3 (three) times daily.   . potassium chloride  (K-DUR,KLOR-CON) 10 MEQ tablet TAKE ONE TABLET BY MOUTH EVERY DAY (Patient not taking: Reported on 07/31/2019)  . Probiotic Product (PROBIOTIC-10 PO) Take 1 capsule by mouth daily.  . rosuvastatin (CRESTOR) 10 MG tablet Take 1 tablet (10 mg total) by mouth at bedtime. (Patient not taking: Reported on 10/05/2018)  . sertraline (ZOLOFT) 100 MG tablet Take 1.5 tablets by mouth daily.  . sertraline (ZOLOFT) 100 MG tablet Take 1.5 tablets (150 mg total) by mouth daily. (Patient not taking: Reported on 07/31/2019)   No facility-administered medications prior to visit.    Review of Systems  Constitutional: Negative for appetite change, chills, fatigue and fever.  HENT: Negative.   Eyes: Negative.   Respiratory: Negative for chest tightness and shortness of breath.   Cardiovascular: Negative for chest pain and palpitations.  Gastrointestinal: Negative for abdominal pain, nausea and vomiting.  Endocrine: Negative.   Genitourinary: Negative.   Allergic/Immunologic: Negative.   Neurological: Negative for dizziness and weakness.  Psychiatric/Behavioral: Positive for confusion.       Objective    BP (!) 169/76 (BP Location: Right Arm, Patient Position: Sitting, Cuff Size: Normal)   Pulse 78   Temp (!) 97.2 F (36.2 C) (Other (Comment))   Resp 16   Ht 5' (1.524 m)   Wt 135 lb (61.2 kg)   LMP  (LMP Unknown)   SpO2 96%   BMI 26.37 kg/m   BP Readings from Last 3 Encounters:  07/31/19 (!) 169/76  04/24/19 (!) 155/84  02/05/19 (!) 176/89   Wt Readings from Last 3 Encounters:  07/31/19 135 lb (61.2 kg)  04/24/19 137 lb (62.1 kg)  02/05/19 138 lb 3.2 oz (62.7 kg)      MMSE - Mini Mental State Exam 07/31/2019 08/21/2018 01/10/2018  Orientation to time 3 3 4   Orientation to Place 5 5 5   Registration 3 3 3   Attention/ Calculation 5 2 5   Recall 3 2 0  Language- name 2 objects 2 2 2   Language- repeat 1 0 1  Language- follow 3 step command 3 3 3   Language- read & follow direction 1 1 1   Write  a sentence 1 1 1   Copy design 1 1 1   Total score 28 23 26     Physical Exam Vitals reviewed.  Constitutional:      Appearance: She is well-developed and normal weight.     Comments: Pt appears disheveled compared to hr normal appearance.  HENT:     Head: Normocephalic and atraumatic.     Right Ear: External ear normal.     Left Ear: Tympanic membrane and external ear normal.     Nose: Nose normal.     Mouth/Throat:     Pharynx: Oropharynx is clear.  Eyes:     General: No scleral icterus.    Conjunctiva/sclera: Conjunctivae normal.  Neck:     Thyroid: No thyromegaly.  Cardiovascular:     Rate and Rhythm:  Normal rate and regular rhythm.     Heart sounds: Normal heart sounds.  Pulmonary:     Effort: Pulmonary effort is normal.     Breath sounds: Normal breath sounds.  Abdominal:     Palpations: Abdomen is soft.  Musculoskeletal:        General: No swelling.     Right lower leg: No edema.     Left lower leg: No edema.     Comments: Mild tenderness base of left thumb.  Lymphadenopathy:     Cervical: No cervical adenopathy.  Skin:    General: Skin is warm and dry.  Neurological:     General: No focal deficit present.     Mental Status: She is oriented to person, place, and time. Mental status is at baseline.  Psychiatric:        Behavior: Behavior normal.     Comments: Slightly withdrawn today     MMSE 28/30 today.  No results found for any visits on 07/31/19.  Assessment & Plan      1. Essential hypertension Restart all meds and RTC 1 month.  2. Gastroesophageal reflux disease, unspecified whether esophagitis present   3. Hypothyroid More than 50% 25 minute visit spent in counseling or coordination of care   4. Late onset Alzheimer's disease without behavioral disturbance ([place]) Consider CCM consult as I am worried about pt and her husband.  5. Post-traumatic osteoarthritis of first carpometacarpal joint of left hand   6. Pure  hypercholesterolemia   7. Mild major depression ([place]) Restart SSRI  8. Anxiety SSRI  No follow-ups on file.      I, [name], MD, have reviewed all documentation for this visit. The documentation on 08/02/19 for the exam, diagnosis, procedures, and orders are all accurate and complete.    Callan Norden Cranford Mon, MD  [facility] [phone] (phone) [phone] (fax)  [place]

## 2019-07-31 ENCOUNTER — Ambulatory Visit (INDEPENDENT_AMBULATORY_CARE_PROVIDER_SITE_OTHER): Payer: Medicare Other | Admitting: Family Medicine

## 2019-07-31 ENCOUNTER — Other Ambulatory Visit: Payer: Self-pay

## 2019-07-31 ENCOUNTER — Encounter: Payer: Self-pay | Admitting: Family Medicine

## 2019-07-31 VITALS — BP 169/76 | HR 78 | Temp 97.2°F | Resp 16 | Ht 60.0 in | Wt 135.0 lb

## 2019-07-31 DIAGNOSIS — E063 Autoimmune thyroiditis: Secondary | ICD-10-CM

## 2019-07-31 DIAGNOSIS — G301 Alzheimer's disease with late onset: Secondary | ICD-10-CM

## 2019-07-31 DIAGNOSIS — K219 Gastro-esophageal reflux disease without esophagitis: Secondary | ICD-10-CM

## 2019-07-31 DIAGNOSIS — M1832 Unilateral post-traumatic osteoarthritis of first carpometacarpal joint, left hand: Secondary | ICD-10-CM | POA: Diagnosis not present

## 2019-07-31 DIAGNOSIS — E7800 Pure hypercholesterolemia, unspecified: Secondary | ICD-10-CM | POA: Diagnosis not present

## 2019-07-31 DIAGNOSIS — F0280 Dementia in other diseases classified elsewhere without behavioral disturbance: Secondary | ICD-10-CM | POA: Diagnosis not present

## 2019-07-31 DIAGNOSIS — F320 Major depressive disorder, single episode, mild: Secondary | ICD-10-CM

## 2019-07-31 DIAGNOSIS — F419 Anxiety disorder, unspecified: Secondary | ICD-10-CM

## 2019-07-31 DIAGNOSIS — I10 Essential (primary) hypertension: Secondary | ICD-10-CM

## 2019-07-31 NOTE — Patient Instructions (Addendum)
Restart the following medications:    Medication Sig  . amLODipine (NORVASC) 10 MG tablet TAKE ONE TABLET BY MOUTH EVERY DAY (Patient not taking: Reported on 07/31/2019)  . Cholecalciferol (VITAMIN D-3) 5000 units TABS Take by mouth daily.      Marland Kitchen donepezil (ARICEPT) 10 MG tablet Take 1 tablet (10 mg total) by mouth at bedtime. (Patient not taking: Reported on 11/22/2018)  . isosorbide mononitrate (IMDUR) 30 MG 24 hr tablet Take 1 tablet (30 mg total) by mouth every morning. (Patient not taking: Reported on 11/22/2018)  . levothyroxine (SYNTHROID) 100 MCG tablet TAKE 1 TABLET (100 MCG TOTAL) BY MOUTH DAILY BEFORE BREAKFAST. (Patient not taking: Reported on 07/31/2019)      . losartan (COZAAR) 100 MG tablet TAKE 1 TABLET BY MOUTH EVERY DAY (Patient not taking: Reported on 07/31/2019)          . metoprolol tartrate (LOPRESSOR) 50 MG tablet TAKE ONE TABLET BY MOUTH EVERY DAY (Patient not taking: Reported on 07/31/2019)  .    .        . pantoprazole (PROTONIX) 40 MG tablet TAKE 1 TABLET BY MOUTH EVERY DAY (Patient not taking: Reported on 11/22/2018)              . rosuvastatin (CRESTOR) 10 MG tablet Take 1 tablet (10 mg total) by mouth at bedtime. (Patient not taking: Reported on 10/05/2018)      . sertraline (ZOLOFT) 100 MG tablet Take 1.5 tablets (150 mg total) by mouth daily. (Patient not taking: Reported on 07/31/2019)

## 2019-08-01 ENCOUNTER — Telehealth: Payer: Self-pay

## 2019-08-01 ENCOUNTER — Other Ambulatory Visit: Payer: Self-pay | Admitting: Family Medicine

## 2019-08-01 MED ORDER — LEVOTHYROXINE SODIUM 100 MCG PO TABS: 100.0000 ug | ORAL_TABLET | Freq: Every day | ORAL | 0 refills | Status: DC

## 2019-08-01 NOTE — Telephone Encounter (Signed)
Last TSH lab reviewed by PCP- aware of result. Rx refilled per protocol. Patient is requesting additional medication- copy of call sent to office for review

## 2019-08-01 NOTE — Telephone Encounter (Signed)
Has already been picked up by patient's spouse.

## 2019-08-01 NOTE — Telephone Encounter (Signed)
Medication Refill - Medication: levothyroxine (SYNTHROID) 100 MCG tablet   Has the patient contacted their pharmacy? Yes.   Patient was unable to reach anyone. (Agent: If no, request that the patient contact the pharmacy for the refill.) (Agent: If yes, when and what did the pharmacy advise?)  Preferred Pharmacy (with phone number or street name): [facility] [address]  Patient would also like to have something prescribed for her nerves.  Agent: Please be advised that RX refills may take up to 3 business days. We ask that you follow-up with your pharmacy.

## 2019-08-01 NOTE — Telephone Encounter (Signed)
Copied from [place] [id number]. Topic: General - Inquiry >> Aug 01, 2019  9:21 AM Richardo Priest, NT wrote: Reason for CRM: Patient's husband called in stating that paperwork from yesterday was destroyed and that he will be swinging by the office to receive a new copy of AVS papers if that's okay. Please advise.

## 2019-08-07 ENCOUNTER — Telehealth: Payer: Self-pay

## 2019-08-07 NOTE — Telephone Encounter (Signed)
Copied from [place] [phone]. Topic: General - Other >> Aug 07, 2019  3:04 PM Hinda Lenis D wrote: Reason for CRM: PT daughter T. Louretta Shorten [phone] has some questions about Pt medicines / she's not in the emergency contacts / please advise

## 2019-08-08 NOTE — Telephone Encounter (Signed)
Called to advise patient's daughter that we have to have [place] add her to the [facility] form before we can give out any of her information.

## 2019-08-23 ENCOUNTER — Other Ambulatory Visit: Payer: Self-pay | Admitting: Adult Health

## 2019-08-23 ENCOUNTER — Other Ambulatory Visit: Payer: Self-pay | Admitting: Family Medicine

## 2019-08-23 MED ORDER — SERTRALINE HCL 50 MG PO TABS: 50.0000 mg | ORAL_TABLET | Freq: Every day | ORAL | 0 refills | Status: DC

## 2019-08-23 MED ORDER — PANTOPRAZOLE SODIUM 40 MG PO TBEC: 40.0000 mg | DELAYED_RELEASE_TABLET | Freq: Every day | ORAL | 1 refills | Status: DC

## 2019-08-23 NOTE — Telephone Encounter (Signed)
Medication Refill - Medication: omeprazole (PRILOSEC) 40 MG capsule    Has the patient contacted their pharmacy? yes (Agent: If no, request that the patient contact the pharmacy for the refill.) (Agent: If yes, when and what did the pharmacy advise?)contact pcp  Preferred Pharmacy (with phone number or street name):  [id number] [place], [place] - [address] Phone:  [phone]  Fax:  [phone]       Agent: Please be advised that RX refills may take up to 3 business days. We ask that you follow-up with your pharmacy.

## 2019-08-23 NOTE — Telephone Encounter (Signed)
Requested medication (s) are due for refill today: yes  Requested medication (s) are on the active medication list: yes  Last refill:  02/10/20  Future visit scheduled: yes  Notes to clinic:  pt says it has been a while since she took this med; see note dated 08/16/19; pt would like script sent to [facility]   Requested Prescriptions  Pending Prescriptions Disp Refills   sertraline (ZOLOFT) 100 MG tablet 135 tablet 3    Sig: Take 1.5 tablets (150 mg total) by mouth daily.      Psychiatry:  Antidepressants - SSRI Passed - 08/23/2019 11:54 AM      Passed - Completed PHQ-2 or PHQ-9 in the last 360 days.      Passed - Valid encounter within last 6 months    Recent Outpatient Visits           3 weeks ago Essential hypertension   [facility] Jerrol Banana., MD   4 months ago Essential hypertension   [facility] Jerrol Banana., MD   6 months ago Bursitis of both hips, unspecified bursa   [facility] Jerrol Banana., MD   9 months ago MCI (mild cognitive impairment)   [facility] Jerrol Banana., MD   1 year ago Late onset Alzheimer's disease without behavioral disturbance [facility])   [facility] Jerrol Banana., MD       Future Appointments             In 1 week Jerrol Banana., MD [facility] [place]

## 2019-08-23 NOTE — Telephone Encounter (Signed)
Dr. Rosanna Randy ,   I see in your note you were restarting her ssri Zoloft , of note I did a med reconciliation and see she was started on Abilify by neurology in care everywhere February as well. I just wanted to make you aware, I have not yet sent a script wanted to get your preference.   Benay Spice, [place] - 04/20/2019 10:30 AM EST   1. Mixed dementia (probable Alzheimer's Disease + Vascular dementia) in patient with hippocampal volume less than 5% percentile for age and gender + seudodementia of depression -We encouraged patient to stay physically active and exercise on regular basis. Exercise can be very beneficial in many neurological conditions.

## 2019-08-23 NOTE — Telephone Encounter (Signed)
Contacted the pt to verify her requested medication; she states it is sertraline; pt encouraged to her pharmacy for refills will expedite process because a request will be sent electronically; she verbalized understanding.

## 2019-08-23 NOTE — Telephone Encounter (Signed)
Refill request for prescription to help with her anxiety (she couldn't remember the name)  [place]

## 2019-08-23 NOTE — Telephone Encounter (Signed)
Noted and e - prescribed.   Thank you,  Laverna Peace MSN, AGNP-C, FNP-C  Family Nurse Practitioner  Adult Geriatric Nurse Practitioner

## 2019-08-23 NOTE — Telephone Encounter (Signed)
RX was printed instead of escribed

## 2019-08-23 NOTE — Telephone Encounter (Signed)
Discussed with Dr. Rosanna Randy regarding the Abilify ok to restart Zoloft.  This is a restart medication - provider restarting at 50 mg and then can increase as tolerated since she has not been on it in over a year. Please advise patient and can follow up in one month if tolerating and symptoms not improving for dose adjustment.

## 2019-08-23 NOTE — Telephone Encounter (Signed)
Attempted to call the pt. To inquire if she is requesting a refill on Sertraline (Zoloft).  Left vm to return call to the office.

## 2019-08-29 ENCOUNTER — Telehealth: Payer: Self-pay

## 2019-08-29 NOTE — Telephone Encounter (Signed)
Copied from [place] [id number]. Topic: General - Other >> Aug 29, 2019  9:20 AM Celene Kras wrote: Reason for CRM: Pts husband called and is requesting to have a print out of the patients insurance info that he can pick up from the office. Pts husband states that the pt has lost her pocketbook with all of the information. Please advise .

## 2019-08-31 DIAGNOSIS — F325 Major depressive disorder, single episode, in full remission: Secondary | ICD-10-CM | POA: Insufficient documentation

## 2019-08-31 DIAGNOSIS — G301 Alzheimer's disease with late onset: Secondary | ICD-10-CM | POA: Insufficient documentation

## 2019-08-31 DIAGNOSIS — F0280 Dementia in other diseases classified elsewhere without behavioral disturbance: Secondary | ICD-10-CM | POA: Insufficient documentation

## 2019-08-31 NOTE — Progress Notes (Deleted)
Established patient visit   Patient: Paige Baldwin   DOB: 1945/12/26   74 y.o. Female  MRN: [id number] Visit Date: 09/04/2019  Today's healthcare provider: [name], MD   No chief complaint on file.  Subjective    HPI  Hypertension, follow-up  BP Readings from Last 3 Encounters:  07/31/19 (!) 169/76  04/24/19 (!) 155/84  02/05/19 (!) 176/89   Wt Readings from Last 3 Encounters:  07/31/19 135 lb (61.2 kg)  04/24/19 137 lb (62.1 kg)  02/05/19 138 lb 3.2 oz (62.7 kg)     She was last seen for hypertension 1 months ago.  BP at that visit was 169/76. Management since that visit includes restart all medications.  She reports {excellent/good/fair/poor:19665} compliance with treatment. She {is/is not:9024} having side effects. {document side effects if present:1} She is following a {diet:21022986} diet. She {is/is not:9024} exercising. She {does/does not:200015} smoke.  Use of agents associated with hypertension: {bp agents assoc with hypertension:511::"none"}.   Outside blood pressures are {***enter patient reported home BP readings, or 'not being checked':1}.  --------------------------------------------------------------------------------------------------- Late onset Alzheimer's disease without behavioral disturbance ([place])- 07/31/19 Consider CCM consult as I am worried about pt and her husband  Mild major depression ([place])- 07/31/19 Restart SSRI  {Show patient history (optional):23778::" "}   Medications: Outpatient Medications Prior to Visit  Medication Sig  . amLODipine (NORVASC) 10 MG tablet TAKE ONE TABLET BY MOUTH EVERY DAY (Patient not taking: Reported on 07/31/2019)  . ARIPiprazole (ABILIFY) 2 MG tablet Take 2 mg by mouth daily.  . Cholecalciferol (VITAMIN D-3) 5000 units TABS Take by mouth daily.  . COLOSTRUM PO Take by mouth daily.   Marland Kitchen donepezil (ARICEPT) 10 MG tablet Take 1 tablet (10 mg total) by mouth at bedtime. (Patient not taking: Reported on  11/22/2018)  . isosorbide mononitrate (IMDUR) 30 MG 24 hr tablet Take 1 tablet (30 mg total) by mouth every morning. (Patient not taking: Reported on 11/22/2018)  . levothyroxine (SYNTHROID) 100 MCG tablet Take 1 tablet (100 mcg total) by mouth daily before breakfast.  . levothyroxine (SYNTHROID, LEVOTHROID) 25 MCG tablet Take 1 tablet (25 mcg total) by mouth daily before breakfast. (Patient not taking: Reported on 10/05/2018)  . losartan (COZAAR) 100 MG tablet TAKE 1 TABLET BY MOUTH EVERY DAY (Patient not taking: Reported on 07/31/2019)  . Magnesium Malate 1250 (141.7 Mg) MG TABS Take 0.5 tablets by mouth daily.   . meloxicam (MOBIC) 7.5 MG tablet TAKE 1 TABLET BY MOUTH EVERY DAY (Patient not taking: Reported on 07/31/2019)  . metoprolol tartrate (LOPRESSOR) 50 MG tablet TAKE ONE TABLET BY MOUTH EVERY DAY (Patient not taking: Reported on 07/31/2019)  . NON FORMULARY brain elevate 1-2 capsules daily  . NON FORMULARY Total Restore - 1 capsule daily  . omeprazole (PRILOSEC) 40 MG capsule Take 40 mg by mouth daily.  . pantoprazole (PROTONIX) 40 MG tablet Take 1 tablet (40 mg total) by mouth daily.  Marland Kitchen PAPAYA ENZYME PO Take 1 tablet by mouth 3 (three) times daily.   . potassium chloride (K-DUR,KLOR-CON) 10 MEQ tablet TAKE ONE TABLET BY MOUTH EVERY DAY (Patient not taking: Reported on 07/31/2019)  . Probiotic Product (PROBIOTIC-10 PO) Take 1 capsule by mouth daily.  . rosuvastatin (CRESTOR) 10 MG tablet Take 1 tablet (10 mg total) by mouth at bedtime. (Patient not taking: Reported on 10/05/2018)  . sertraline (ZOLOFT) 50 MG tablet Take 1 tablet (50 mg total) by mouth daily.   No facility-administered medications prior to  visit.    Review of Systems  {Heme  Chem  Endocrine  Serology  Results Review (optional):23779::" "}  Objective    LMP  (LMP Unknown)  {Show previous vital signs (optional):23777::" "}  Physical Exam  ***  No results found for any visits on 09/04/19.  Assessment & Plan      ***  No follow-ups on file.      {provider attestation***:1}   [name], MD  [facility] [phone] (phone) [phone] (fax)  [place]

## 2019-09-04 ENCOUNTER — Ambulatory Visit: Payer: Self-pay | Admitting: Family Medicine

## 2019-09-19 ENCOUNTER — Other Ambulatory Visit: Payer: Self-pay

## 2019-09-19 ENCOUNTER — Telehealth: Payer: Self-pay

## 2019-09-19 DIAGNOSIS — I10 Essential (primary) hypertension: Secondary | ICD-10-CM

## 2019-09-19 DIAGNOSIS — E7800 Pure hypercholesterolemia, unspecified: Secondary | ICD-10-CM

## 2019-09-19 DIAGNOSIS — E039 Hypothyroidism, unspecified: Secondary | ICD-10-CM

## 2019-09-19 MED ORDER — QUETIAPINE FUMARATE 25 MG PO TABS: 25.0000 mg | ORAL_TABLET | Freq: Every day | ORAL | 1 refills | Status: DC

## 2019-09-19 MED ORDER — LORAZEPAM 0.5 MG PO TABS: 0.5000 mg | ORAL_TABLET | Freq: Two times a day (BID) | ORAL | 1 refills | Status: DC | PRN

## 2019-09-19 NOTE — Telephone Encounter (Signed)
Left message for Marland Kitchen to call back.  We tried to get the patient in today and we were told she couldn't come in today.   Copied from [place] [phone]. Topic: General - Inquiry >> Sep 19, 2019 12:12 PM Mathis Bud wrote: Reason for CRM: Patient son Marland Kitchen) called stating he needs an earlier appt then Monday 8/2 for her dementia.  Patient husband is in the hospital so she is home alone.  Patient is not eating, not taking her medication.  Patient son is very concerned stating her dementia is very bad right now.  Patient son is trying to get patient into home but needs Fl2 forms.  Patient son states she cannot function as of right now. Call back [phone]

## 2019-09-19 NOTE — Telephone Encounter (Signed)
Copied from [place] [id number]. Topic: General - Other >> Sep 19, 2019  9:26 AM Celene Kras wrote: Reason for CRM: Pts daughter called and is requesting to have pt on waitlist for appt. She states that she believes the pt needs to be seen sooner than 8//2/21. Please advise.

## 2019-09-19 NOTE — Telephone Encounter (Signed)
Returned call, she said she is unable to make it today because she has to go to see her spouse at the hospital. Paige Baldwin that she would like to keep her appointment for Monday.

## 2019-09-19 NOTE — Telephone Encounter (Signed)
Spoke with the son and they are working on getting patient put into a nursing facility as her permanent residency.  Due to the situation with her husband being in the hospital she is not doing well at all per the family.  She is not eating, She is not taking her medication and is now extremely confused, a little combative and all out of sorts.    Family would like to know if she could possibly have a mild sedative to take for the next couple to few weeks to help during the transition.    [facility]

## 2019-09-20 NOTE — Progress Notes (Deleted)
Established patient visit   Patient: Paige Baldwin   DOB: Aug 16, 1945   74 y.o. Female  MRN: [id number] Visit Date: 09/24/2019  Today's healthcare provider: [name], MD   No chief complaint on file.  Subjective    HPI  ***  {Show patient history (optional):23778::" "}   Medications: Outpatient Medications Prior to Visit  Medication Sig  . amLODipine (NORVASC) 10 MG tablet TAKE ONE TABLET BY MOUTH EVERY DAY (Patient not taking: Reported on 07/31/2019)  . ARIPiprazole (ABILIFY) 2 MG tablet Take 2 mg by mouth daily.  . Cholecalciferol (VITAMIN D-3) 5000 units TABS Take by mouth daily.  . COLOSTRUM PO Take by mouth daily.   Marland Kitchen donepezil (ARICEPT) 10 MG tablet Take 1 tablet (10 mg total) by mouth at bedtime. (Patient not taking: Reported on 11/22/2018)  . isosorbide mononitrate (IMDUR) 30 MG 24 hr tablet Take 1 tablet (30 mg total) by mouth every morning. (Patient not taking: Reported on 11/22/2018)  . levothyroxine (SYNTHROID) 100 MCG tablet Take 1 tablet (100 mcg total) by mouth daily before breakfast.  . levothyroxine (SYNTHROID, LEVOTHROID) 25 MCG tablet Take 1 tablet (25 mcg total) by mouth daily before breakfast. (Patient not taking: Reported on 10/05/2018)  . LORazepam (ATIVAN) 0.5 MG tablet Take 1 tablet (0.5 mg total) by mouth 2 (two) times daily as needed for anxiety.  Marland Kitchen losartan (COZAAR) 100 MG tablet TAKE 1 TABLET BY MOUTH EVERY DAY (Patient not taking: Reported on 07/31/2019)  . Magnesium Malate 1250 (141.7 Mg) MG TABS Take 0.5 tablets by mouth daily.   . meloxicam (MOBIC) 7.5 MG tablet TAKE 1 TABLET BY MOUTH EVERY DAY (Patient not taking: Reported on 07/31/2019)  . metoprolol tartrate (LOPRESSOR) 50 MG tablet TAKE ONE TABLET BY MOUTH EVERY DAY (Patient not taking: Reported on 07/31/2019)  . NON FORMULARY brain elevate 1-2 capsules daily  . NON FORMULARY Total Restore - 1 capsule daily  . omeprazole (PRILOSEC) 40 MG capsule Take 40 mg by mouth daily.  . pantoprazole  (PROTONIX) 40 MG tablet Take 1 tablet (40 mg total) by mouth daily.  Marland Kitchen PAPAYA ENZYME PO Take 1 tablet by mouth 3 (three) times daily.   . potassium chloride (K-DUR,KLOR-CON) 10 MEQ tablet TAKE ONE TABLET BY MOUTH EVERY DAY (Patient not taking: Reported on 07/31/2019)  . Probiotic Product (PROBIOTIC-10 PO) Take 1 capsule by mouth daily.  . QUEtiapine (SEROQUEL) 25 MG tablet Take 1 tablet (25 mg total) by mouth at bedtime.  . rosuvastatin (CRESTOR) 10 MG tablet Take 1 tablet (10 mg total) by mouth at bedtime. (Patient not taking: Reported on 10/05/2018)  . sertraline (ZOLOFT) 50 MG tablet Take 1 tablet (50 mg total) by mouth daily.   No facility-administered medications prior to visit.    Review of Systems  Constitutional: Negative for appetite change, chills, fatigue and fever.  Respiratory: Negative for chest tightness and shortness of breath.   Cardiovascular: Negative for chest pain and palpitations.  Gastrointestinal: Negative for abdominal pain, nausea and vomiting.  Neurological: Negative for dizziness and weakness.    {Heme  Chem  Endocrine  Serology  Results Review (optional):23779::" "}  Objective    LMP  (LMP Unknown)  {Show previous vital signs (optional):23777::" "}  Physical Exam  ***  No results found for any visits on 09/24/19.  Assessment & Plan     ***  No follow-ups on file.      {provider attestation***:1}   [name], MD  [facility] [phone] (phone) [phone] (fax)  [place]

## 2019-09-24 ENCOUNTER — Ambulatory Visit: Payer: Self-pay | Admitting: Family Medicine

## 2019-09-26 ENCOUNTER — Telehealth: Payer: Self-pay | Admitting: Family Medicine

## 2019-09-26 NOTE — Telephone Encounter (Signed)
Copied from [place] [phone]. Topic: Medicare AWV >> Sep 26, 2019  2:14 PM Cher Nakai R wrote: Reason for CRM:   Left message for patient to call back and schedule Medicare Annual Wellness Visit (AWV) either virtually or in office.  Last AWV  10/05/2018  Please schedule at anytime with [facility] Health Advisor.

## 2019-10-01 ENCOUNTER — Telehealth: Payer: Self-pay | Admitting: Family Medicine

## 2019-10-01 NOTE — Telephone Encounter (Signed)
Please advise 

## 2019-10-01 NOTE — Telephone Encounter (Signed)
Patient's daughter Lockie Mola is calling to request TB -[place]. CB- [phone]

## 2019-10-02 NOTE — Telephone Encounter (Signed)
I assume you mean TB skin test.  We no longer do them at the office.  I hear pharmacies offer the skin test or we can order the QuantiFERON gold blood test.

## 2019-10-03 ENCOUNTER — Telehealth: Payer: Self-pay

## 2019-10-03 NOTE — Telephone Encounter (Signed)
Copied from [place] [phone]. Topic: General - Call Back - No Documentation >> Oct 03, 2019 10:00 AM Jaynie Collins D wrote: Reason for CRM: Patient's daughter was returning phone call, regarding TB test. Please call daughter back.

## 2019-10-03 NOTE — Telephone Encounter (Signed)
Patient's son Suezanne Jacquet was advised.

## 2019-10-03 NOTE — Telephone Encounter (Signed)
Per Dr. Rosanna Randy okay to give pt's daughter Lisabeth Pick information below. Lisabeth Pick is not in pt's dpr. LMOVM for Lisabeth Pick to return call.

## 2019-10-08 DIAGNOSIS — E538 Deficiency of other specified B group vitamins: Secondary | ICD-10-CM | POA: Diagnosis not present

## 2019-10-08 DIAGNOSIS — G309 Alzheimer's disease, unspecified: Secondary | ICD-10-CM | POA: Diagnosis not present

## 2019-10-08 DIAGNOSIS — E519 Thiamine deficiency, unspecified: Secondary | ICD-10-CM | POA: Diagnosis not present

## 2019-10-08 DIAGNOSIS — F0150 Vascular dementia without behavioral disturbance: Secondary | ICD-10-CM | POA: Diagnosis not present

## 2019-10-08 DIAGNOSIS — F418 Other specified anxiety disorders: Secondary | ICD-10-CM | POA: Diagnosis not present

## 2019-10-08 DIAGNOSIS — F0280 Dementia in other diseases classified elsewhere without behavioral disturbance: Secondary | ICD-10-CM | POA: Diagnosis not present

## 2019-10-10 ENCOUNTER — Other Ambulatory Visit: Payer: Self-pay | Admitting: Neurology

## 2019-10-10 DIAGNOSIS — F0150 Vascular dementia without behavioral disturbance: Secondary | ICD-10-CM

## 2019-10-24 ENCOUNTER — Other Ambulatory Visit: Payer: Self-pay

## 2019-10-24 ENCOUNTER — Encounter: Payer: Self-pay | Admitting: Family Medicine

## 2019-10-24 ENCOUNTER — Ambulatory Visit (INDEPENDENT_AMBULATORY_CARE_PROVIDER_SITE_OTHER): Payer: Medicare Other | Admitting: Family Medicine

## 2019-10-24 VITALS — BP 136/72 | HR 89 | Temp 98.4°F | Wt 131.0 lb

## 2019-10-24 DIAGNOSIS — F419 Anxiety disorder, unspecified: Secondary | ICD-10-CM | POA: Diagnosis not present

## 2019-10-24 DIAGNOSIS — G459 Transient cerebral ischemic attack, unspecified: Secondary | ICD-10-CM | POA: Diagnosis not present

## 2019-10-24 DIAGNOSIS — G5602 Carpal tunnel syndrome, left upper limb: Secondary | ICD-10-CM | POA: Diagnosis not present

## 2019-10-24 DIAGNOSIS — E039 Hypothyroidism, unspecified: Secondary | ICD-10-CM | POA: Diagnosis not present

## 2019-10-24 DIAGNOSIS — G9001 Carotid sinus syncope: Secondary | ICD-10-CM | POA: Diagnosis not present

## 2019-10-24 DIAGNOSIS — I10 Essential (primary) hypertension: Secondary | ICD-10-CM

## 2019-10-24 DIAGNOSIS — F0280 Dementia in other diseases classified elsewhere without behavioral disturbance: Secondary | ICD-10-CM

## 2019-10-24 DIAGNOSIS — G301 Alzheimer's disease with late onset: Secondary | ICD-10-CM | POA: Diagnosis not present

## 2019-10-24 NOTE — Progress Notes (Deleted)
Trena Platt Nicodemus Denk,acting as a scribe for [name], MD.,have documented all relevant documentation on the behalf of [name], MD,as directed by  [name], MD while in the presence of [name], MD.  Established patient visit   Patient: Paige Baldwin   DOB: [birth year]   73 y.o. Female  MRN: [id number] Visit Date: 10/24/2019  Today's healthcare provider: [name], MD   Chief Complaint  Patient presents with  . Hyperlipidemia  . Hypertension  . Hypothyroidism   Subjective    HPI   Hypertension, follow-up  BP Readings from Last 3 Encounters:  07/31/19 (!) 169/76  04/24/19 (!) 155/84  02/05/19 (!) 176/89   Wt Readings from Last 3 Encounters:  07/31/19 135 lb (61.2 kg)  04/24/19 137 lb (62.1 kg)  02/05/19 138 lb 3.2 oz (62.7 kg)     She was last seen for hypertension 3 months ago.  BP at that visit was 169/76. Management since that visit includes restarting meds.  She reports {excellent/good/fair/poor:19665} compliance with treatment. She {is/is not:9024} having side effects. {document side effects if present:1} She is following a {diet:21022986} diet. She {is/is not:9024} exercising. She {does/does not:200015} smoke.  Use of agents associated with hypertension: {bp agents assoc with hypertension:511::"none"}.   Outside blood pressures are {***enter patient reported home BP readings, or 'not being checked':1}.  Pertinent labs: Lab Results  Component Value Date   CHOL 172 03/03/2017   HDL 73 03/03/2017   LDLCALC 87 03/03/2017   TRIG 62 03/03/2017   CHOLHDL 3.7 12/16/2016   Lab Results  Component Value Date   NA 141 11/22/2018   K 4.2 11/22/2018   CREATININE 0.92 11/22/2018   GFRNONAA 62 11/22/2018   GFRAA 71 11/22/2018   GLUCOSE 97 11/22/2018     The 10-year ASCVD risk score Mikey Bussing DC Jr., et al., 2013) is: 19.6%    ---------------------------------------------------------------------------------------------------  {Show patient history (optional):23778::" "}   Medications: Outpatient Medications Prior to Visit  Medication Sig  . amLODipine (NORVASC) 10 MG tablet TAKE ONE TABLET BY MOUTH EVERY DAY (Patient not taking: Reported on 07/31/2019)  . ARIPiprazole (ABILIFY) 2 MG tablet Take 2 mg by mouth daily.  . Cholecalciferol (VITAMIN D-3) 5000 units TABS Take by mouth daily.  . COLOSTRUM PO Take by mouth daily.   Marland Kitchen donepezil (ARICEPT) 10 MG tablet Take 1 tablet (10 mg total) by mouth at bedtime. (Patient not taking: Reported on 11/22/2018)  . isosorbide mononitrate (IMDUR) 30 MG 24 hr tablet Take 1 tablet (30 mg total) by mouth every morning. (Patient not taking: Reported on 11/22/2018)  . levothyroxine (SYNTHROID) 100 MCG tablet Take 1 tablet (100 mcg total) by mouth daily before breakfast.  . levothyroxine (SYNTHROID, LEVOTHROID) 25 MCG tablet Take 1 tablet (25 mcg total) by mouth daily before breakfast. (Patient not taking: Reported on 10/05/2018)  . LORazepam (ATIVAN) 0.5 MG tablet Take 1 tablet (0.5 mg total) by mouth 2 (two) times daily as needed for anxiety.  Marland Kitchen losartan (COZAAR) 100 MG tablet TAKE 1 TABLET BY MOUTH EVERY DAY (Patient not taking: Reported on 07/31/2019)  . Magnesium Malate 1250 (141.7 Mg) MG TABS Take 0.5 tablets by mouth daily.   . meloxicam (MOBIC) 7.5 MG tablet TAKE 1 TABLET BY MOUTH EVERY DAY (Patient not taking: Reported on 07/31/2019)  . metoprolol tartrate (LOPRESSOR) 50 MG tablet TAKE ONE TABLET BY MOUTH EVERY DAY (Patient not taking: Reported on 07/31/2019)  . NON FORMULARY brain elevate 1-2 capsules  daily  . NON FORMULARY Total Restore - 1 capsule daily  . omeprazole (PRILOSEC) 40 MG capsule Take 40 mg by mouth daily.  . pantoprazole (PROTONIX) 40 MG tablet Take 1 tablet (40 mg total) by mouth daily.  Marland Kitchen PAPAYA ENZYME PO Take 1 tablet by mouth 3 (three) times daily.   . potassium  chloride (K-DUR,KLOR-CON) 10 MEQ tablet TAKE ONE TABLET BY MOUTH EVERY DAY (Patient not taking: Reported on 07/31/2019)  . Probiotic Product (PROBIOTIC-10 PO) Take 1 capsule by mouth daily.  . QUEtiapine (SEROQUEL) 25 MG tablet Take 1 tablet (25 mg total) by mouth at bedtime.  . rosuvastatin (CRESTOR) 10 MG tablet Take 1 tablet (10 mg total) by mouth at bedtime. (Patient not taking: Reported on 10/05/2018)  . sertraline (ZOLOFT) 50 MG tablet Take 1 tablet (50 mg total) by mouth daily.   No facility-administered medications prior to visit.    Review of Systems  {Heme  Chem  Endocrine  Serology  Results Review (optional):23779::" "}  Objective    LMP  (LMP Unknown)  {Show previous vital signs (optional):23777::" "}  Physical Exam  ***  No results found for any visits on 10/24/19.  Assessment & Plan     ***  No follow-ups on file.      {provider attestation***:1}   [name], MD  [facility] [phone] (phone) [phone] (fax)  [place]

## 2019-10-24 NOTE — Progress Notes (Signed)
Established patient visit   Patient: Paige Baldwin   DOB: 1945-10-18   74 y.o. Female  MRN: [id number] Visit Date: 10/24/2019  Today's healthcare provider: [name], MD   Chief Complaint  Patient presents with  . Follow-up   Subjective    HPI   Patient complaining of numbness and tingling In her fingers on left hand, her daughter said that her first time bringing it up to her was on Saturday.  She states she has having tenderness on the left side of her neck in the area of the carotid sinus. The tingling in her fingers appears to be in the middle 3 fingers of her left hand.  Patient's daughter also thinks that she she may have had a TIA on Sunday, she says that her mothers face was drooping and her speech was slurred.  This only lasted a few minutes and then she is asymptomatic.   MMSE - Mini Mental State Exam 10/24/2019 07/31/2019 08/21/2018  Orientation to time 4 3 3   Orientation to Place 4 5 5   Registration 3 3 3   Attention/ Calculation 5 5 2   Recall 3 3 2   Language- name 2 objects 2 2 2   Language- repeat 1 1 0  Language- follow 3 step command 3 3 3   Language- read & follow direction 1 1 1   Write a sentence 1 1 1   Copy design 1 1 1   Total score 28 28 23    Past Medical History:  Diagnosis Date  . Dementia ([place])   . Depression   . GERD (gastroesophageal reflux disease)   . Hyperlipidemia   . Hypertension   . Hypothyroidism        Medications: Outpatient Medications Prior to Visit  Medication Sig  . amLODipine (NORVASC) 10 MG tablet TAKE ONE TABLET BY MOUTH EVERY DAY  . metoprolol tartrate (LOPRESSOR) 50 MG tablet TAKE ONE TABLET BY MOUTH EVERY DAY  . omeprazole (PRILOSEC) 40 MG capsule Take 40 mg by mouth daily.  . pantoprazole (PROTONIX) 40 MG tablet Take 1 tablet (40 mg total) by mouth daily.  . potassium chloride (K-DUR,KLOR-CON) 10 MEQ tablet TAKE ONE TABLET BY MOUTH EVERY DAY  . rosuvastatin (CRESTOR) 10 MG tablet Take 1 tablet (10 mg total) by  mouth at bedtime.  . sertraline (ZOLOFT) 50 MG tablet Take 1 tablet (50 mg total) by mouth daily.  . ARIPiprazole (ABILIFY) 2 MG tablet Take 2 mg by mouth daily. (Patient not taking: Reported on 10/24/2019)  . Cholecalciferol (VITAMIN D-3) 5000 units TABS Take by mouth daily. (Patient not taking: Reported on 10/24/2019)  . COLOSTRUM PO Take by mouth daily.  (Patient not taking: Reported on 10/24/2019)  . donepezil (ARICEPT) 10 MG tablet Take 1 tablet (10 mg total) by mouth at bedtime. (Patient not taking: Reported on 11/22/2018)  . isosorbide mononitrate (IMDUR) 30 MG 24 hr tablet Take 1 tablet (30 mg total) by mouth every morning. (Patient not taking: Reported on 11/22/2018)  . levothyroxine (SYNTHROID) 100 MCG tablet Take 1 tablet (100 mcg total) by mouth daily before breakfast. (Patient not taking: Reported on 10/24/2019)  . levothyroxine (SYNTHROID, LEVOTHROID) 25 MCG tablet Take 1 tablet (25 mcg total) by mouth daily before breakfast. (Patient not taking: Reported on 10/05/2018)  . LORazepam (ATIVAN) 0.5 MG tablet Take 1 tablet (0.5 mg total) by mouth 2 (two) times daily as needed for anxiety. (Patient not taking: Reported on 10/24/2019)  . losartan (COZAAR) 100 MG tablet TAKE 1 TABLET BY  MOUTH EVERY DAY (Patient not taking: Reported on 07/31/2019)  . Magnesium Malate 1250 (141.7 Mg) MG TABS Take 0.5 tablets by mouth daily.  (Patient not taking: Reported on 10/24/2019)  . meloxicam (MOBIC) 7.5 MG tablet TAKE 1 TABLET BY MOUTH EVERY DAY (Patient not taking: Reported on 07/31/2019)  . NON FORMULARY brain elevate 1-2 capsules daily  . NON FORMULARY Total Restore - 1 capsule daily (Patient not taking: Reported on 10/24/2019)  . PAPAYA ENZYME PO Take 1 tablet by mouth 3 (three) times daily.  (Patient not taking: Reported on 10/24/2019)  . Probiotic Product (PROBIOTIC-10 PO) Take 1 capsule by mouth daily. (Patient not taking: Reported on 10/24/2019)  . QUEtiapine (SEROQUEL) 25 MG tablet Take 1 tablet (25 mg total) by mouth  at bedtime. (Patient not taking: Reported on 10/24/2019)   No facility-administered medications prior to visit.    Review of Systems  Last thyroid functions Lab Results  Component Value Date   TSH 5.810 (H) 11/22/2018      Objective    BP 136/72 (BP Location: Left Arm, Patient Position: Sitting, Cuff Size: Normal)   Pulse 89   Temp 98.4 F (36.9 C) (Oral)   Wt 131 lb (59.4 kg)   LMP  (LMP Unknown)   BMI 25.58 kg/m  BP Readings from Last 3 Encounters:  10/24/19 136/72  07/31/19 (!) 169/76  04/24/19 (!) 155/84   Wt Readings from Last 3 Encounters:  10/24/19 131 lb (59.4 kg)  07/31/19 135 lb (61.2 kg)  04/24/19 137 lb (62.1 kg)      Physical Exam Vitals reviewed.  Constitutional:      Appearance: Normal appearance. She is well-developed and normal weight.  HENT:     Head: Normocephalic and atraumatic.     Right Ear: External ear normal.     Left Ear: Tympanic membrane and external ear normal.     Nose: Nose normal.     Mouth/Throat:     Pharynx: Oropharynx is clear.  Eyes:     General: No scleral icterus.    Conjunctiva/sclera: Conjunctivae normal.  Neck:     Thyroid: No thyromegaly.     Comments: No cervical adenopathy but she does have minimal tenderness over the left carotid sinus area Cardiovascular:     Rate and Rhythm: Normal rate and regular rhythm.     Heart sounds: Normal heart sounds.  Pulmonary:     Effort: Pulmonary effort is normal.     Breath sounds: Normal breath sounds.  Abdominal:     Palpations: Abdomen is soft.  Musculoskeletal:     Right lower leg: No edema.     Left lower leg: No edema.  Lymphadenopathy:     Cervical: No cervical adenopathy.  Skin:    General: Skin is warm and dry.  Neurological:     General: No focal deficit present.     Mental Status: She is alert and oriented to person, place, and time. Mental status is at baseline.  Psychiatric:        Mood and Affect: Mood normal.        Behavior: Behavior normal.         Thought Content: Thought content normal.        Judgment: Judgment normal.     MMSE 28/30 today  No results found for any visits on 10/24/19.  Assessment & Plan     1. TIA (transient ischemic attack) Clinically it seems the patient did have a TIA by description.  Will obtain  carotid Dopplers.  Neuro exam is nonfocal today so no imaging presently.  Follow-up 2 to 3 months. - US Carotid Duplex Bilateral  2. Carpal tunnel syndrome of left wrist Try cock-up wrist splint.  3. Carotidynia Carotid Dopplers arranged  4. Late onset Alzheimer's disease without behavioral disturbance ([place]) She seems of help mild cognitive impairment by her mid MMSE but her family describes some problems with ADLs.  5. Essential hypertension Good control.  6. Adult hypothyroidism Follow TSH.  7. Anxiety Clinically she has always had more anxiety than depression.  She is on donepezil for her cognitive issues and as needed lorazepam although this is not an ideal issue with her aging.   No follow-ups on file.         Kyrianna Barletta Cranford Mon, MD  [facility] [phone] (phone) [phone] (fax)  [place]

## 2019-10-24 NOTE — Patient Instructions (Signed)
TRY COAK UP WRIST SPLINT !!!

## 2019-10-25 ENCOUNTER — Other Ambulatory Visit: Payer: Self-pay | Admitting: Family Medicine

## 2019-11-02 ENCOUNTER — Telehealth: Payer: Self-pay

## 2019-11-02 ENCOUNTER — Ambulatory Visit
Admission: RE | Admit: 2019-11-02 | Discharge: 2019-11-02 | Disposition: A | Discharge status: Home / Self Care | Payer: Medicare Other | Source: Ambulatory Visit | Attending: Neurology | Admitting: Neurology

## 2019-11-02 ENCOUNTER — Other Ambulatory Visit: Payer: Self-pay

## 2019-11-02 DIAGNOSIS — S066X0A Traumatic subarachnoid hemorrhage without loss of consciousness, initial encounter: Secondary | ICD-10-CM | POA: Diagnosis not present

## 2019-11-02 DIAGNOSIS — F0150 Vascular dementia without behavioral disturbance: Secondary | ICD-10-CM | POA: Insufficient documentation

## 2019-11-02 DIAGNOSIS — G309 Alzheimer's disease, unspecified: Secondary | ICD-10-CM

## 2019-11-02 DIAGNOSIS — R413 Other amnesia: Secondary | ICD-10-CM | POA: Diagnosis not present

## 2019-11-02 DIAGNOSIS — R29810 Facial weakness: Secondary | ICD-10-CM | POA: Diagnosis not present

## 2019-11-02 DIAGNOSIS — F0280 Dementia in other diseases classified elsewhere without behavioral disturbance: Secondary | ICD-10-CM | POA: Diagnosis not present

## 2019-11-02 DIAGNOSIS — R4781 Slurred speech: Secondary | ICD-10-CM | POA: Diagnosis not present

## 2019-11-02 MED ORDER — GADOBUTROL 1 MMOL/ML IV SOLN
5.0000 mL | Freq: Once | INTRAVENOUS | Status: AC | PRN
  Administered 2019-11-02: 5 mL via INTRAVENOUS

## 2019-11-02 NOTE — Telephone Encounter (Signed)
Attempted to reach patient/daughter was unable to get a hold of anyone on phone voicemail box is full. KW

## 2019-11-02 NOTE — Telephone Encounter (Signed)
Copied from [place] [id number]. Topic: General - Inquiry >> Nov 02, 2019 10:25 AM Alanda Slim E wrote: Reason for CRM: Pts daughter called about an appt or order for Ultrasound of the neck area / please advise asap

## 2019-11-07 ENCOUNTER — Telehealth: Payer: Self-pay

## 2019-11-07 NOTE — Telephone Encounter (Signed)
Returned patient's call, LVMTCB.

## 2019-11-07 NOTE — Telephone Encounter (Signed)
Copied from [place] [id number]. Topic: General - Other >> Nov 07, 2019 12:25 PM Antonieta Iba C wrote: Reason for CRM: pt called in for assistance. Pt says that she just received a call from the office, not sure from whom. Pt says that she was made aware of a few concerns that she has. Pt would like to pick up a copy today if possible.    Please assist pt further.

## 2019-11-07 NOTE — Telephone Encounter (Signed)
I believe that the call was from [place] to let them know that she had ordered the neck US for them.

## 2019-11-08 ENCOUNTER — Other Ambulatory Visit: Payer: Self-pay

## 2019-11-08 ENCOUNTER — Ambulatory Visit
Admission: RE | Admit: 2019-11-08 | Discharge: 2019-11-08 | Disposition: A | Discharge status: Home / Self Care | Payer: Medicare Other | Source: Ambulatory Visit | Attending: Family Medicine | Admitting: Family Medicine

## 2019-11-08 DIAGNOSIS — G459 Transient cerebral ischemic attack, unspecified: Secondary | ICD-10-CM | POA: Diagnosis not present

## 2019-11-08 DIAGNOSIS — I6523 Occlusion and stenosis of bilateral carotid arteries: Secondary | ICD-10-CM | POA: Diagnosis not present

## 2019-11-19 ENCOUNTER — Other Ambulatory Visit: Payer: Self-pay | Admitting: Adult Health

## 2019-11-23 ENCOUNTER — Emergency Department
Admission: EM | Admit: 2019-11-23 | Discharge: 2019-11-23 | Disposition: A | Discharge status: Home / Self Care | Payer: Medicare Other | Attending: Emergency Medicine | Admitting: Emergency Medicine

## 2019-11-23 ENCOUNTER — Other Ambulatory Visit: Payer: Self-pay

## 2019-11-23 ENCOUNTER — Ambulatory Visit: Payer: Self-pay

## 2019-11-23 ENCOUNTER — Emergency Department: Payer: Medicare Other

## 2019-11-23 DIAGNOSIS — M542 Cervicalgia: Secondary | ICD-10-CM | POA: Diagnosis not present

## 2019-11-23 DIAGNOSIS — R202 Paresthesia of skin: Secondary | ICD-10-CM | POA: Insufficient documentation

## 2019-11-23 DIAGNOSIS — R29810 Facial weakness: Secondary | ICD-10-CM | POA: Diagnosis not present

## 2019-11-23 DIAGNOSIS — R200 Anesthesia of skin: Secondary | ICD-10-CM | POA: Diagnosis not present

## 2019-11-23 DIAGNOSIS — R479 Unspecified speech disturbances: Secondary | ICD-10-CM | POA: Diagnosis not present

## 2019-11-23 DIAGNOSIS — Z5321 Procedure and treatment not carried out due to patient leaving prior to being seen by health care provider: Secondary | ICD-10-CM | POA: Insufficient documentation

## 2019-11-23 DIAGNOSIS — M79602 Pain in left arm: Secondary | ICD-10-CM | POA: Diagnosis not present

## 2019-11-23 DIAGNOSIS — R4781 Slurred speech: Secondary | ICD-10-CM | POA: Diagnosis not present

## 2019-11-23 DIAGNOSIS — F0390 Unspecified dementia without behavioral disturbance: Secondary | ICD-10-CM | POA: Insufficient documentation

## 2019-11-23 DIAGNOSIS — R4182 Altered mental status, unspecified: Secondary | ICD-10-CM | POA: Diagnosis not present

## 2019-11-23 LAB — CBC
HCT: 40.4 % (ref 36.0–46.0)
Hemoglobin: 13.7 g/dL (ref 12.0–15.0)
MCH: 29.4 pg (ref 26.0–34.0)
MCHC: 33.9 g/dL (ref 30.0–36.0)
MCV: 86.7 fL (ref 80.0–100.0)
Platelets: 212 10*3/uL (ref 150–400)
RBC: 4.66 MIL/uL (ref 3.87–5.11)
RDW: 13.1 % (ref 11.5–15.5)
WBC: 6.1 10*3/uL (ref 4.0–10.5)
nRBC: 0 % (ref 0.0–0.2)

## 2019-11-23 LAB — COMPREHENSIVE METABOLIC PANEL
ALT: 15 U/L (ref 0–44)
AST: 18 U/L (ref 15–41)
Albumin: 4.5 g/dL (ref 3.5–5.0)
Alkaline Phosphatase: 72 U/L (ref 38–126)
Anion gap: 10 (ref 5–15)
BUN: 11 mg/dL (ref 8–23)
CO2: 26 mmol/L (ref 22–32)
Calcium: 9.1 mg/dL (ref 8.9–10.3)
Chloride: 103 mmol/L (ref 98–111)
Creatinine, Ser: 0.87 mg/dL (ref 0.44–1.00)
GFR calc Af Amer: >60 mL/min (ref >60)
GFR calc non Af Amer: >60 mL/min (ref >60)
Glucose, Bld: 109 mg/dL — ABNORMAL HIGH (ref 70–99)
Potassium: 4 mmol/L (ref 3.5–5.1)
Sodium: 139 mmol/L (ref 135–145)
Total Bilirubin: 0.6 mg/dL (ref 0.3–1.2)
Total Protein: 7.9 g/dL (ref 6.5–8.1)

## 2019-11-23 LAB — APTT: aPTT: 32 seconds (ref 24–36)

## 2019-11-23 LAB — DIFFERENTIAL
Abs Immature Granulocytes: 0.05 10*3/uL (ref 0.00–0.07)
Basophils Absolute: 0 10*3/uL (ref 0.0–0.1)
Basophils Relative: 0 %
Eosinophils Absolute: 0 10*3/uL (ref 0.0–0.5)
Eosinophils Relative: 0 %
Immature Granulocytes: 1 %
Lymphocytes Relative: 27 %
Lymphs Abs: 1.7 10*3/uL (ref 0.7–4.0)
Monocytes Absolute: 1.6 10*3/uL — ABNORMAL HIGH (ref 0.1–1.0)
Monocytes Relative: 26 %
Neutro Abs: 2.8 10*3/uL (ref 1.7–7.7)
Neutrophils Relative %: 46 %

## 2019-11-23 LAB — PROTIME-INR
INR: 0.9 (ref 0.8–1.2)
Prothrombin Time: 12 seconds (ref 11.4–15.2)

## 2019-11-23 MED ORDER — SODIUM CHLORIDE 0.9% FLUSH: 3.0000 mL | Freq: Once | INTRAVENOUS | Status: DC

## 2019-11-23 NOTE — ED Triage Notes (Signed)
Pt comes via POV from home with c/o left arm pain and numbness. Pt states pain in neck as well. Pt's son states pt has had several mini strokes recently. Pt seen by PCP and evaluated.  Son also reports a fainting spell earlier this week on Wednesday. Pt does have hx of dementia.  Son states 2 more mini stroke on Monday per symptoms. Son reports some facial droop and incomprehensible speech as well.  Pt states she just feels plain weak. MINi neuro negative and Van negative.  Pt denies any blood thinners. Pt denies any recent falls.

## 2019-11-23 NOTE — ED Notes (Signed)
Pt and son to first nurse desk. States that pt is going home and will follow up with her PCP.

## 2019-11-23 NOTE — Telephone Encounter (Signed)
Pt.'s son, Suezanne Jacquet, reports pt. Is weak. "Wobbly on her feet."Pt. states she Fainted 2 days ago. Complaining of tingling in left arm and shoulder. Does not think she is eating and drinking well. Concerned she is dehydrated. Concerned about possible stoke activity. Instructed to call 911 if pt. can not get in a vehicle. States he will take her to the ED.  Reason for Disposition . Patient sounds very sick or weak to the triager  Answer Assessment - Initial Assessment Questions 1. DESCRIPTION: "Describe how you are feeling."     Weak 2. SEVERITY: "How bad is it?"  "Can you stand and walk?"   - MILD - Feels weak or tired, but does not interfere with work, school or normal activities   - [place] to stand and walk; weakness interferes with work, school, or normal activities   - SEVERE - Unable to stand or walk     Moderate 3. ONSET:  "When did the weakness begin?"     Today 4. CAUSE: "What do you think is causing the weakness?"     Unsure 5. MEDICINES: "Have you recently started a new medicine or had a change in the amount of a medicine?"     No 6. OTHER SYMPTOMS: "Do you have any other symptoms?" (e.g., chest pain, fever, cough, SOB, vomiting, diarrhea, bleeding, other areas of pain)     Tingling left arm and shoulder 7. PREGNANCY: "Is there any chance you are pregnant?" "When was your last menstrual period?"     No  Protocols used: WEAKNESS (GENERALIZED) AND FATIGUE-A-AH

## 2019-11-23 NOTE — Telephone Encounter (Signed)
FYI

## 2019-11-26 ENCOUNTER — Telehealth: Payer: Self-pay

## 2019-11-26 NOTE — Telephone Encounter (Signed)
Copied from [place] [id number] [id number]. Topic: General - Inquiry >> Nov 26, 2019  9:09 AM Greggory Keen D wrote: Reason for CRM: Pt's son Suezanne Jacquet called saying he took his mom to the ER Friday night for what he thought was TIA's .  They did CT, Labs, EKG but there was a 19 hr wait and no one ever came back in and gave results and mom wanted to leave.  They left and now son wants Dr. Rosanna Randy to look at the test and let them know results.  He says mom seems to be doing better.  She has dementia so it is hard to tell.  CB#  [phone]

## 2019-11-28 DIAGNOSIS — F0280 Dementia in other diseases classified elsewhere without behavioral disturbance: Secondary | ICD-10-CM | POA: Diagnosis not present

## 2019-11-28 DIAGNOSIS — R569 Unspecified convulsions: Secondary | ICD-10-CM | POA: Diagnosis not present

## 2019-11-28 DIAGNOSIS — Z8673 Personal history of transient ischemic attack (TIA), and cerebral infarction without residual deficits: Secondary | ICD-10-CM | POA: Diagnosis not present

## 2019-11-28 DIAGNOSIS — F418 Other specified anxiety disorders: Secondary | ICD-10-CM | POA: Diagnosis not present

## 2019-11-28 DIAGNOSIS — G309 Alzheimer's disease, unspecified: Secondary | ICD-10-CM | POA: Diagnosis not present

## 2019-11-28 DIAGNOSIS — F0150 Vascular dementia without behavioral disturbance: Secondary | ICD-10-CM | POA: Diagnosis not present

## 2019-12-18 ENCOUNTER — Telehealth: Payer: Self-pay | Admitting: Family Medicine

## 2019-12-18 NOTE — Telephone Encounter (Signed)
Paperwork for [facility] was left for Dr. Rosanna Randy to fill out for patient.   Please call [name] [phone]), daughter of patient when completed and ready for pick up. Paperwork was placed in Dr. Alben Spittle box.   Thanks, [employer]

## 2019-12-18 NOTE — Telephone Encounter (Signed)
Paperwork received and placed in Dr. Alben Spittle box.

## 2019-12-21 ENCOUNTER — Observation Stay: Payer: Medicare Other

## 2019-12-21 ENCOUNTER — Inpatient Hospital Stay
Admission: EM | Admit: 2019-12-21 | Discharge: 2019-12-24 | DRG: 065 | Disposition: A | Discharge status: Home Health | Payer: Medicare Other | Attending: Internal Medicine | Admitting: Internal Medicine

## 2019-12-21 ENCOUNTER — Emergency Department: Payer: Medicare Other

## 2019-12-21 ENCOUNTER — Other Ambulatory Visit: Payer: Self-pay

## 2019-12-21 DIAGNOSIS — G459 Transient cerebral ischemic attack, unspecified: Secondary | ICD-10-CM | POA: Diagnosis not present

## 2019-12-21 DIAGNOSIS — R4701 Aphasia: Secondary | ICD-10-CM | POA: Diagnosis not present

## 2019-12-21 DIAGNOSIS — E039 Hypothyroidism, unspecified: Secondary | ICD-10-CM | POA: Diagnosis present

## 2019-12-21 DIAGNOSIS — I10 Essential (primary) hypertension: Secondary | ICD-10-CM | POA: Diagnosis not present

## 2019-12-21 DIAGNOSIS — F03918 Unspecified dementia, unspecified severity, with other behavioral disturbance: Secondary | ICD-10-CM | POA: Diagnosis present

## 2019-12-21 DIAGNOSIS — R9401 Abnormal electroencephalogram [EEG]: Secondary | ICD-10-CM

## 2019-12-21 DIAGNOSIS — R29818 Other symptoms and signs involving the nervous system: Secondary | ICD-10-CM | POA: Diagnosis not present

## 2019-12-21 DIAGNOSIS — Z90722 Acquired absence of ovaries, bilateral: Secondary | ICD-10-CM

## 2019-12-21 DIAGNOSIS — E785 Hyperlipidemia, unspecified: Secondary | ICD-10-CM | POA: Diagnosis present

## 2019-12-21 DIAGNOSIS — Z79890 Hormone replacement therapy (postmenopausal): Secondary | ICD-10-CM

## 2019-12-21 DIAGNOSIS — F0391 Unspecified dementia with behavioral disturbance: Secondary | ICD-10-CM | POA: Diagnosis present

## 2019-12-21 DIAGNOSIS — Z9049 Acquired absence of other specified parts of digestive tract: Secondary | ICD-10-CM

## 2019-12-21 DIAGNOSIS — F0280 Dementia in other diseases classified elsewhere without behavioral disturbance: Secondary | ICD-10-CM | POA: Diagnosis not present

## 2019-12-21 DIAGNOSIS — F32A Depression, unspecified: Secondary | ICD-10-CM | POA: Diagnosis present

## 2019-12-21 DIAGNOSIS — F0151 Vascular dementia with behavioral disturbance: Secondary | ICD-10-CM | POA: Diagnosis not present

## 2019-12-21 DIAGNOSIS — R29810 Facial weakness: Secondary | ICD-10-CM | POA: Diagnosis not present

## 2019-12-21 DIAGNOSIS — R2681 Unsteadiness on feet: Secondary | ICD-10-CM | POA: Diagnosis present

## 2019-12-21 DIAGNOSIS — F419 Anxiety disorder, unspecified: Secondary | ICD-10-CM | POA: Diagnosis present

## 2019-12-21 DIAGNOSIS — Z90710 Acquired absence of both cervix and uterus: Secondary | ICD-10-CM

## 2019-12-21 DIAGNOSIS — R451 Restlessness and agitation: Secondary | ICD-10-CM | POA: Diagnosis present

## 2019-12-21 DIAGNOSIS — I6529 Occlusion and stenosis of unspecified carotid artery: Secondary | ICD-10-CM

## 2019-12-21 DIAGNOSIS — R479 Unspecified speech disturbances: Secondary | ICD-10-CM | POA: Diagnosis not present

## 2019-12-21 DIAGNOSIS — I63511 Cerebral infarction due to unspecified occlusion or stenosis of right middle cerebral artery: Principal | ICD-10-CM | POA: Diagnosis present

## 2019-12-21 DIAGNOSIS — G309 Alzheimer's disease, unspecified: Secondary | ICD-10-CM | POA: Diagnosis present

## 2019-12-21 DIAGNOSIS — E063 Autoimmune thyroiditis: Secondary | ICD-10-CM | POA: Diagnosis present

## 2019-12-21 DIAGNOSIS — G301 Alzheimer's disease with late onset: Secondary | ICD-10-CM | POA: Diagnosis present

## 2019-12-21 DIAGNOSIS — Z20822 Contact with and (suspected) exposure to covid-19: Secondary | ICD-10-CM | POA: Diagnosis present

## 2019-12-21 DIAGNOSIS — F0281 Dementia in other diseases classified elsewhere with behavioral disturbance: Secondary | ICD-10-CM | POA: Diagnosis present

## 2019-12-21 DIAGNOSIS — Z9079 Acquired absence of other genital organ(s): Secondary | ICD-10-CM

## 2019-12-21 DIAGNOSIS — K219 Gastro-esophageal reflux disease without esophagitis: Secondary | ICD-10-CM | POA: Diagnosis present

## 2019-12-21 DIAGNOSIS — Z8249 Family history of ischemic heart disease and other diseases of the circulatory system: Secondary | ICD-10-CM

## 2019-12-21 DIAGNOSIS — Z96691 Finger-joint replacement of right hand: Secondary | ICD-10-CM | POA: Diagnosis present

## 2019-12-21 DIAGNOSIS — Z79899 Other long term (current) drug therapy: Secondary | ICD-10-CM

## 2019-12-21 LAB — DIFFERENTIAL
Abs Immature Granulocytes: 0.07 10*3/uL (ref 0.00–0.07)
Basophils Absolute: 0 10*3/uL (ref 0.0–0.1)
Basophils Relative: 0 %
Eosinophils Absolute: 0 10*3/uL (ref 0.0–0.5)
Eosinophils Relative: 0 %
Immature Granulocytes: 1 %
Lymphocytes Relative: 22 %
Lymphs Abs: 1.3 10*3/uL (ref 0.7–4.0)
Monocytes Absolute: 1.7 10*3/uL — ABNORMAL HIGH (ref 0.1–1.0)
Monocytes Relative: 30 %
Neutro Abs: 2.7 10*3/uL (ref 1.7–7.7)
Neutrophils Relative %: 47 %

## 2019-12-21 LAB — URINALYSIS, COMPLETE (UACMP) WITH MICROSCOPIC
Bacteria, UA: NONE SEEN
Bilirubin Urine: NEGATIVE
Glucose, UA: NEGATIVE mg/dL
Ketones, ur: 20 mg/dL — AB
Leukocytes,Ua: NEGATIVE
Nitrite: NEGATIVE
Protein, ur: NEGATIVE mg/dL
Specific Gravity, Urine: 1.018 (ref 1.005–1.030)
pH: 6 (ref 5.0–8.0)

## 2019-12-21 LAB — CBC
HCT: 43.1 % (ref 36.0–46.0)
Hemoglobin: 13.9 g/dL (ref 12.0–15.0)
MCH: 28.8 pg (ref 26.0–34.0)
MCHC: 32.3 g/dL (ref 30.0–36.0)
MCV: 89.4 fL (ref 80.0–100.0)
Platelets: 209 10*3/uL (ref 150–400)
RBC: 4.82 MIL/uL (ref 3.87–5.11)
RDW: 13.1 % (ref 11.5–15.5)
WBC: 5.9 10*3/uL (ref 4.0–10.5)
nRBC: 0 % (ref 0.0–0.2)

## 2019-12-21 LAB — COMPREHENSIVE METABOLIC PANEL
ALT: 19 U/L (ref 0–44)
AST: 19 U/L (ref 15–41)
Albumin: 4.5 g/dL (ref 3.5–5.0)
Alkaline Phosphatase: 85 U/L (ref 38–126)
Anion gap: 9 (ref 5–15)
BUN: 13 mg/dL (ref 8–23)
CO2: 25 mmol/L (ref 22–32)
Calcium: 9.1 mg/dL (ref 8.9–10.3)
Chloride: 104 mmol/L (ref 98–111)
Creatinine, Ser: 0.75 mg/dL (ref 0.44–1.00)
GFR, Estimated: >60 mL/min (ref >60)
Glucose, Bld: 116 mg/dL — ABNORMAL HIGH (ref 70–99)
Potassium: 4.1 mmol/L (ref 3.5–5.1)
Sodium: 138 mmol/L (ref 135–145)
Total Bilirubin: 0.7 mg/dL (ref 0.3–1.2)
Total Protein: 7.7 g/dL (ref 6.5–8.1)

## 2019-12-21 LAB — RESPIRATORY PANEL BY RT PCR (FLU A&B, COVID)
Influenza A by PCR: NEGATIVE
Influenza B by PCR: NEGATIVE
SARS Coronavirus 2 by RT PCR: NEGATIVE

## 2019-12-21 LAB — PROTIME-INR
INR: 0.9 (ref 0.8–1.2)
Prothrombin Time: 12.2 seconds (ref 11.4–15.2)

## 2019-12-21 LAB — APTT: aPTT: 30 seconds (ref 24–36)

## 2019-12-21 MED ORDER — POTASSIUM CHLORIDE CRYS ER 10 MEQ PO TBCR
10.0000 meq | EXTENDED_RELEASE_TABLET | Freq: Every day | ORAL | Status: DC
  Administered 2019-12-21 – 2019-12-24 (×4): 10 meq via ORAL
  Filled 2019-12-21 (×4): qty 1

## 2019-12-21 MED ORDER — HALOPERIDOL LACTATE 5 MG/ML IJ SOLN: 2.0000 mg | Freq: Once | INTRAMUSCULAR | Status: AC

## 2019-12-21 MED ORDER — ARIPIPRAZOLE 2 MG PO TABS
2.0000 mg | ORAL_TABLET | Freq: Every day | ORAL | Status: DC
  Administered 2019-12-21 – 2019-12-22 (×2): 2 mg via ORAL
  Filled 2019-12-21 (×3): qty 1

## 2019-12-21 MED ORDER — PANTOPRAZOLE SODIUM 40 MG PO TBEC
40.0000 mg | DELAYED_RELEASE_TABLET | Freq: Every day | ORAL | Status: DC
  Administered 2019-12-21 – 2019-12-24 (×4): 40 mg via ORAL
  Filled 2019-12-21 (×4): qty 1

## 2019-12-21 MED ORDER — SODIUM CHLORIDE 0.9 % IV SOLN: INTRAVENOUS | Status: DC

## 2019-12-21 MED ORDER — HALOPERIDOL LACTATE 5 MG/ML IJ SOLN
INTRAMUSCULAR | Status: AC
  Administered 2019-12-21: 23:00:00 2 mg via INTRAVENOUS
  Filled 2019-12-21: qty 1

## 2019-12-21 MED ORDER — STROKE: EARLY STAGES OF RECOVERY BOOK: Freq: Once | Status: AC

## 2019-12-21 MED ORDER — ACETAMINOPHEN 160 MG/5ML PO SOLN
650.0000 mg | ORAL | Status: DC | PRN
  Filled 2019-12-21: qty 20.3

## 2019-12-21 MED ORDER — ASPIRIN 81 MG PO CHEW
324.0000 mg | CHEWABLE_TABLET | Freq: Once | ORAL | Status: AC
  Administered 2019-12-21: 324 mg via ORAL
  Filled 2019-12-21: qty 4

## 2019-12-21 MED ORDER — SODIUM CHLORIDE 0.9% FLUSH
3.0000 mL | Freq: Once | INTRAVENOUS | Status: AC
  Administered 2019-12-21: 3 mL via INTRAVENOUS

## 2019-12-21 MED ORDER — LEVOTHYROXINE SODIUM 100 MCG PO TABS
100.0000 ug | ORAL_TABLET | Freq: Every day | ORAL | Status: DC
  Administered 2019-12-23: 07:00:00 100 ug via ORAL
  Filled 2019-12-21 (×2): qty 1

## 2019-12-21 MED ORDER — SERTRALINE HCL 50 MG PO TABS
50.0000 mg | ORAL_TABLET | Freq: Every day | ORAL | Status: DC
  Administered 2019-12-21 – 2019-12-24 (×4): 50 mg via ORAL
  Filled 2019-12-21 (×4): qty 1

## 2019-12-21 MED ORDER — ACETAMINOPHEN 650 MG RE SUPP: 650.0000 mg | RECTAL | Status: DC | PRN

## 2019-12-21 MED ORDER — ASPIRIN 300 MG RE SUPP: 300.0000 mg | Freq: Every day | RECTAL | Status: DC

## 2019-12-21 MED ORDER — ASPIRIN 325 MG PO TABS
325.0000 mg | ORAL_TABLET | Freq: Every day | ORAL | Status: DC
  Administered 2019-12-22 – 2019-12-24 (×3): 325 mg via ORAL
  Filled 2019-12-21 (×4): qty 1

## 2019-12-21 MED ORDER — DONEPEZIL HCL 5 MG PO TABS
10.0000 mg | ORAL_TABLET | Freq: Every day | ORAL | Status: DC
  Administered 2019-12-21: 10 mg via ORAL
  Filled 2019-12-21 (×3): qty 2

## 2019-12-21 MED ORDER — ACETAMINOPHEN 325 MG PO TABS: 650.0000 mg | ORAL_TABLET | ORAL | Status: DC | PRN

## 2019-12-21 NOTE — Progress Notes (Unsigned)
EEG completed, results pending. 

## 2019-12-21 NOTE — ED Notes (Signed)
Pt grandson to the the front desk to ask why patient was still waiting. RN apologized for wait and told grandson that pt is on the list to be triaged and should be one of the next few to go back. Informed Grandson that pt has been evaluated by RN when she first came in and that due to not having a LKW pt is out of the code stroke window but she will be worked up for a stroke. Grandson asked how long to would have to wait to be seen, RN explained that we could not give him an exact wait time because it could change based on what comes in. Yolanda Bonine concerned because he states that pt was rushed over from [facility] and now she is having to wait in the lobby. RN informed grandson that we are working as hard as we can to get pt back.

## 2019-12-21 NOTE — ED Notes (Signed)
MD at bedside. 

## 2019-12-21 NOTE — ED Provider Notes (Signed)
[facility] Emergency Department Provider Note   ____________________________________________   First MD Initiated Contact with Patient 12/21/19 1303     (approximate)  I have reviewed the triage vital signs and the nursing notes.   HISTORY  Chief Complaint Aphasia    HPI Paige Baldwin is a 74 y.o. female with past medical history of hypertension, hyperlipidemia, and dementia who presents to the ED for aphasia.  35 of history is obtained from son due to patient's baseline dementia.  Son states that he called her earlier this morning to remind her of scheduled EEG due to episodes of decreased responsiveness that she has been having.  When he spoke to her on the phone, she was very slow to respond and seemed to be slurring her words.  He then came to her residence to check on her, found her with a right-sided facial droop, ongoing slurred speech, and unsteady gait.  He states that episode today was similar to what she is dealt with in the past, but it has typically resolved over 30 minutes.  Today it lasted for at least 3 hours before resolving just after arrival to the ED.  Patient currently states that she feels fine, denies any recent fevers, cough, chest pain, shortness of breath, vomiting, diarrhea, dysuria, or hematuria.        Past Medical History:  Diagnosis Date  . Dementia ([place])   . Depression   . GERD (gastroesophageal reflux disease)   . Hyperlipidemia   . Hypertension   . Hypothyroidism     Patient Active Problem List   Diagnosis Date Noted  . Late onset Alzheimer's disease without behavioral disturbance ([place]) 08/31/2019  . Depression, major, single episode, complete remission ([place]) 08/31/2019  . Alzheimer's dementia ([place]) 03/24/2018  . Anxiety 07/12/2014  . Clinical depression 07/12/2014  . Acid reflux 07/12/2014  . Autoimmune lymphocytic chronic thyroiditis 07/12/2014  . HLD (hyperlipidemia) 07/12/2014  . Essential hypertension  07/12/2014  . Adult hypothyroidism 07/12/2014  . Mild major depression ([place]) 07/12/2014  . Arthritis, degenerative 07/12/2014  . Post menopausal syndrome 07/12/2014  . CMC arthritis, thumb, degenerative 06/25/2014    Past Surgical History:  Procedure Laterality Date  . ABDOMINAL HYSTERECTOMY    . APPENDECTOMY     during cholecystectomy  . BACK SURGERY    . CHOLECYSTECTOMY     removed appendix at this time  . COLONOSCOPY WITH PROPOFOL N/A 06/30/2015   Procedure: COLONOSCOPY WITH PROPOFOL;  Surgeon: Manya Silvas, MD;  [facility];  Service: Endoscopy;  Laterality: N/A;tubular adenoma repeat 06/2020  . FINGER ARTHROPLASTY Right 06/25/2014   Procedure: FINGER ARTHROPLASTY;  Surgeon: Christophe Louis, MD;  Location: [facility] ORS;  Service: Orthopedics;  Laterality: Right;  . SALPINGOOPHORECTOMY      Prior to Admission medications   Medication Sig Start Date End Date Taking? Authorizing Provider  amLODipine (NORVASC) 10 MG tablet TAKE ONE TABLET BY MOUTH EVERY DAY 03/29/18   Jerrol Banana., MD  ARIPiprazole (ABILIFY) 2 MG tablet Take 2 mg by mouth daily. Patient not taking: Reported on 10/24/2019 04/20/19   [provider]  Cholecalciferol (VITAMIN D-3) 5000 units TABS Take by mouth daily. Patient not taking: Reported on 10/24/2019    [provider]  COLOSTRUM PO Take by mouth daily.  Patient not taking: Reported on 10/24/2019    [provider]  donepezil (ARICEPT) 10 MG tablet Take 1 tablet (10 mg total) by mouth at bedtime. Patient not taking: Reported on  11/22/2018 08/21/18   Jerrol Banana., MD  isosorbide mononitrate (IMDUR) 30 MG 24 hr tablet Take 1 tablet (30 mg total) by mouth every morning. Patient not taking: Reported on 11/22/2018 01/24/17   Jerrol Banana., MD  levothyroxine (SYNTHROID) 100 MCG tablet TAKE 1 TABLET BY MOUTH DAILY BEFORE BREAKFAST. 10/25/19   Jerrol Banana., MD  levothyroxine (SYNTHROID, LEVOTHROID) 25 MCG  tablet Take 1 tablet (25 mcg total) by mouth daily before breakfast. Patient not taking: Reported on 10/05/2018 10/04/17   Jerrol Banana., MD  LORazepam (ATIVAN) 0.5 MG tablet Take 1 tablet (0.5 mg total) by mouth 2 (two) times daily as needed for anxiety. Patient not taking: Reported on 10/24/2019 09/19/19   Jerrol Banana., MD  losartan (COZAAR) 100 MG tablet TAKE 1 TABLET BY MOUTH EVERY DAY Patient not taking: Reported on 07/31/2019 10/03/18   Jerrol Banana., MD  Magnesium Malate 1250 (141.7 Mg) MG TABS Take 0.5 tablets by mouth daily.  Patient not taking: Reported on 10/24/2019    [provider]  meloxicam (MOBIC) 7.5 MG tablet TAKE 1 TABLET BY MOUTH EVERY DAY Patient not taking: Reported on 07/31/2019 05/05/18   Jerrol Banana., MD  metoprolol tartrate (LOPRESSOR) 50 MG tablet TAKE ONE TABLET BY MOUTH EVERY DAY 05/25/18   Jerrol Banana., MD  NON FORMULARY brain elevate 1-2 capsules daily    [provider]  NON FORMULARY Total Restore - 1 capsule daily Patient not taking: Reported on 10/24/2019    [provider]  omeprazole (PRILOSEC) 40 MG capsule Take 40 mg by mouth daily.    [provider]  pantoprazole (PROTONIX) 40 MG tablet Take 1 tablet (40 mg total) by mouth daily. 08/23/19   Jerrol Banana., MD  PAPAYA ENZYME PO Take 1 tablet by mouth 3 (three) times daily.  Patient not taking: Reported on 10/24/2019    [provider]  potassium chloride (K-DUR,KLOR-CON) 10 MEQ tablet TAKE ONE TABLET BY MOUTH EVERY DAY 05/22/15   Jerrol Banana., MD  Probiotic Product (PROBIOTIC-10 PO) Take 1 capsule by mouth daily. Patient not taking: Reported on 10/24/2019    [provider]  QUEtiapine (SEROQUEL) 25 MG tablet Take 1 tablet (25 mg total) by mouth at bedtime. Patient not taking: Reported on 10/24/2019 09/19/19   Jerrol Banana., MD  rosuvastatin (CRESTOR) 10 MG tablet Take 1 tablet (10 mg total) by mouth at  bedtime. 01/24/17   Jerrol Banana., MD  sertraline (ZOLOFT) 50 MG tablet TAKE 1 TABLET BY MOUTH EVERY DAY 11/19/19   Flinchum, Kelby Aline, FNP    Allergies Patient has no known allergies.  Family History  Problem Relation Age of Onset  . Hypertension Father   . Heart disease Father   . Diabetes Father   . Congestive Heart Failure Father   . Stroke Paternal Uncle   . Hypertension Brother   . CAD Brother   . Anemia Brother   . Breast cancer Maternal Aunt     Social History Social History   Tobacco Use  . Smoking status: Never Smoker  . Smokeless tobacco: Never Used  Substance Use Topics  . Alcohol use: No  . Drug use: No    Review of Systems  Constitutional: No fever/chills Eyes: No visual changes. ENT: No sore throat. Cardiovascular: Denies chest pain. Respiratory: Denies shortness of breath. Gastrointestinal: No abdominal pain.  No nausea, no vomiting.  No diarrhea.  No constipation. Genitourinary: Negative for dysuria. Musculoskeletal: Negative for back pain. Skin: Negative for rash. Neurological: Negative for headaches.  Positive for facial droop and speech changes.  ____________________________________________   PHYSICAL EXAM:  VITAL SIGNS: ED Triage Vitals  Enc Vitals Group     BP 12/21/19 1126 136/68     Pulse Rate 12/21/19 1126 66     Resp 12/21/19 1126 18     Temp 12/21/19 1126 98 F (36.7 C)     Temp src --      SpO2 12/21/19 1126 95 %     Weight 12/21/19 1127 120 lb (54.4 kg)     Height 12/21/19 1127 5' (1.524 m)     Head Circumference --      Peak Flow --      Pain Score 12/21/19 1127 0     Pain Loc --      Pain Edu? --      Excl. in [place]? --     Constitutional: Alert and oriented. Eyes: Conjunctivae are normal. Head: Atraumatic. Nose: No congestion/rhinnorhea. Mouth/Throat: Mucous membranes are moist. Neck: Normal ROM Cardiovascular: Normal rate, regular rhythm. Grossly normal heart sounds. Respiratory: Normal respiratory  effort.  No retractions. Lungs CTAB. Gastrointestinal: Soft and nontender. No distention. Genitourinary: deferred Musculoskeletal: No lower extremity tenderness nor edema. Neurologic:  Normal speech and language. No gross focal neurologic deficits are appreciated. Skin:  Skin is warm, dry and intact. No rash noted. Psychiatric: Mood and affect are normal. Speech and behavior are normal.  ____________________________________________   LABS (all labs ordered are listed, but only abnormal results are displayed)  Labs Reviewed  DIFFERENTIAL - Abnormal; Notable for the following components:      Result Value   Monocytes Absolute 1.7 (*)    All other components within normal limits  COMPREHENSIVE METABOLIC PANEL - Abnormal; Notable for the following components:   Glucose, Bld 116 (*)    All other components within normal limits  RESPIRATORY PANEL BY RT PCR (FLU A&B, COVID)  PROTIME-INR  APTT  CBC  URINALYSIS, COMPLETE (UACMP) WITH MICROSCOPIC  CBG MONITORING, ED   ____________________________________________  EKG  ED ECG REPORT I, Blake Divine, the attending physician, personally viewed and interpreted this ECG.   Date: 12/21/2019  EKG Time: 11:34  Rate: 64  Rhythm: normal sinus rhythm  Axis: Normal  Intervals:none  ST&T Change: None   PROCEDURES  Procedure(s) performed (including Critical Care):  Procedures   ____________________________________________   INITIAL IMPRESSION / ASSESSMENT AND PLAN / ED COURSE       74 year old female with past medical history of hypertension, hyperlipidemia, dementia who presents to the ED for episode of slurred speech and facial droop earlier today that lasted for about 3 hours prior to resolving.  She does not appear to have any focal neurologic deficits on my exam and is at her baseline mental status.  CT images reviewed and no obvious hemorrhage by my read.  Lab work thus far is unremarkable, UA is pending.  I am concerned  that she could be having recurrent TIAs and would benefit from admission for further evaluation.  We will load with aspirin and discussed with hospitalist.      ____________________________________________   FINAL CLINICAL IMPRESSION(S) / ED DIAGNOSES  Final diagnoses:  TIA (transient ischemic attack)     ED Discharge Orders    None       Note:  This document was prepared using Dragon voice recognition software and may include  unintentional dictation errors.   Blake Divine, MD 12/21/19 1400

## 2019-12-21 NOTE — ED Triage Notes (Signed)
Pt here with grandson. Per grandson, pt has appointment for EEG this morning. Grandson called her this morning to remind her and noticed her speech was slurred. When he went to pick her up at 0930, right side of face was drooped, pt was weak and left hand was hurting . Grandson reports he thinks symptoms started last night but unsure of LKW. Symptoms have resolved now. Pt still complaining on numbness in fingers on L hand.   Hx of the same where symptoms lasted 5 minutes. This time symptoms has lasted longer.

## 2019-12-21 NOTE — ED Notes (Signed)
Patient on cardiac monitor. Patient states that only symptom at this time is numbness in left hand-fingers only. Son states that patient had stronger symptoms earlier including a facial droop that has now resolved prior to being roomed.

## 2019-12-21 NOTE — H&P (Signed)
History and Physical    Paige Baldwin:[id number] DOB: 05-18-1945 DOA: 12/21/2019  PCP: Jerrol Banana., MD   Patient coming from: Home  I have personally briefly reviewed patient's old medical records in [place]  Chief Complaint: Slurred speech  Most of the history was obtained from her son over the phone  HPI: Paige Baldwin is a 74 y.o. female with medical history significant for dementia, hypothyroidism, hypertension, dyslipidemia and GERD who was brought into the emergency room by her son for evaluation of slurred speech, unsteady gait and right facial droop that lasted for about 3 hours and resolved. Patient lives alone and was scheduled for an EEG this morning for evaluation of episodes of unresponsiveness.  Her daughter had called her to remind her of her appointment and noted that her speech was slurred while they were talking.  This happened at about 8:30 AM on the day of admission.  Her son called her and noted that her speech was slurred.  He arrived at the patient's home to take her to her appointment and noticed that she had drooping of the right side of her face and her gait was unsteady.  He also noted that she was very lethargic and was not at her baseline.  He brought her to the ER for further evaluation and while in the ER her symptoms resolved. Patient son states that she has been having episodes like this but not has lasted as long as this episode. I am unable to do a review of systems on this patient due to her dementia. Labs reveal sodium 138, potassium 4.1, chloride 104, bicarb 25, BUN 13, creatinine 0.75, calcium 9.1, alkaline phosphatase 85, albumin 4.5, AST 19, ALT 19, total protein 7.7, white count 5.9, hemoglobin 13.9, hematocrit 43.1, MCV 89.4, RDW 13.1, platelet count 209, PT 12.2, INR 0.9 Respiratory viral panel was negative CT scan of the head without contrast shows no evidence of acute intracranial abnormality.  Similar generalized cerebral  atrophy and chronic microvascular ischemic change.  Suspected chronic watershed territory cortical infarct in the right were better appreciated on prior MRI. Twelve-lead EKG reviewed by me showed normal sinus rhythm    ED Course: Patient is a 74 year old Caucasian female with a history of dementia, hypertension, dyslipidemia and GERD who was brought into the ER by her son for evaluation of a 3-hour history of right facial droop, slurred speech and unsteady gait concerning for possible stroke.  Her symptoms resolved in the emergency room.  She will be referred to observation status for further evaluation.  Review of Systems: As per HPI otherwise 10 point review of systems negative.    Past Medical History:  Diagnosis Date  . Dementia ([place])   . Depression   . GERD (gastroesophageal reflux disease)   . Hyperlipidemia   . Hypertension   . Hypothyroidism     Past Surgical History:  Procedure Laterality Date  . ABDOMINAL HYSTERECTOMY    . APPENDECTOMY     during cholecystectomy  . BACK SURGERY    . CHOLECYSTECTOMY     removed appendix at this time  . COLONOSCOPY WITH PROPOFOL N/A 06/30/2015   Procedure: COLONOSCOPY WITH PROPOFOL;  Surgeon: Manya Silvas, MD;  Location: [facility] ENDOSCOPY;  Service: Endoscopy;  Laterality: N/A;tubular adenoma repeat 06/2020  . FINGER ARTHROPLASTY Right 06/25/2014   Procedure: FINGER ARTHROPLASTY;  Surgeon: Christophe Louis, MD;  Location: [facility] ORS;  Service: Orthopedics;  Laterality: Right;  . SALPINGOOPHORECTOMY  reports that she has never smoked. She has never used smokeless tobacco. She reports that she does not drink alcohol and does not use drugs.  No Known Allergies  Family History  Problem Relation Age of Onset  . Hypertension Father   . Heart disease Father   . Diabetes Father   . Congestive Heart Failure Father   . Stroke Paternal Uncle   . Hypertension Brother   . CAD Brother   . Anemia Brother   . Breast cancer Maternal Aunt       Prior to Admission medications   Medication Sig Start Date End Date Taking? Authorizing Provider  amLODipine (NORVASC) 10 MG tablet TAKE ONE TABLET BY MOUTH EVERY DAY Patient taking differently: Take 10 mg by mouth daily.  03/29/18   Jerrol Banana., MD  ARIPiprazole (ABILIFY) 2 MG tablet Take 2 mg by mouth daily. Patient not taking: Reported on 10/24/2019 04/20/19   [provider]  Cholecalciferol (VITAMIN D-3) 5000 units TABS Take by mouth daily. Patient not taking: Reported on 10/24/2019    [provider]  COLOSTRUM PO Take by mouth daily.  Patient not taking: Reported on 10/24/2019    [provider]  donepezil (ARICEPT) 10 MG tablet Take 1 tablet (10 mg total) by mouth at bedtime. Patient not taking: Reported on 11/22/2018 08/21/18   Jerrol Banana., MD  isosorbide mononitrate (IMDUR) 30 MG 24 hr tablet Take 1 tablet (30 mg total) by mouth every morning. Patient not taking: Reported on 11/22/2018 01/24/17   Jerrol Banana., MD  levothyroxine (SYNTHROID) 100 MCG tablet TAKE 1 TABLET BY MOUTH DAILY BEFORE BREAKFAST. Patient taking differently: Take 100 mcg by mouth daily before breakfast.  10/25/19   Jerrol Banana., MD  levothyroxine (SYNTHROID, LEVOTHROID) 25 MCG tablet Take 1 tablet (25 mcg total) by mouth daily before breakfast. Patient not taking: Reported on 10/05/2018 10/04/17   Jerrol Banana., MD  LORazepam (ATIVAN) 0.5 MG tablet Take 1 tablet (0.5 mg total) by mouth 2 (two) times daily as needed for anxiety. Patient not taking: Reported on 10/24/2019 09/19/19   Jerrol Banana., MD  losartan (COZAAR) 100 MG tablet TAKE 1 TABLET BY MOUTH EVERY DAY Patient not taking: Reported on 07/31/2019 10/03/18   Jerrol Banana., MD  Magnesium Malate 1250 (141.7 Mg) MG TABS Take 0.5 tablets by mouth daily.  Patient not taking: Reported on 10/24/2019    [provider]  meloxicam (MOBIC) 7.5 MG tablet TAKE 1 TABLET BY MOUTH  EVERY DAY Patient not taking: Reported on 07/31/2019 05/05/18   Jerrol Banana., MD  metoprolol tartrate (LOPRESSOR) 50 MG tablet TAKE ONE TABLET BY MOUTH EVERY DAY Patient taking differently: Take 50 mg by mouth daily at 6 (six) AM.  05/25/18   Jerrol Banana., MD  NON FORMULARY brain elevate 1-2 capsules daily    [provider]  NON FORMULARY Total Restore - 1 capsule daily Patient not taking: Reported on 10/24/2019    [provider]  omeprazole (PRILOSEC) 40 MG capsule Take 40 mg by mouth daily.    [provider]  pantoprazole (PROTONIX) 40 MG tablet Take 1 tablet (40 mg total) by mouth daily. 08/23/19   Jerrol Banana., MD  PAPAYA ENZYME PO Take 1 tablet by mouth 3 (three) times daily.  Patient not taking: Reported on 10/24/2019    [provider]  potassium chloride (K-DUR,KLOR-CON) 10 MEQ tablet TAKE  ONE TABLET BY MOUTH EVERY DAY Patient taking differently: Take 10 mEq by mouth daily.  05/22/15   Jerrol Banana., MD  Probiotic Product (PROBIOTIC-10 PO) Take 1 capsule by mouth daily. Patient not taking: Reported on 10/24/2019    [provider]  QUEtiapine (SEROQUEL) 25 MG tablet Take 1 tablet (25 mg total) by mouth at bedtime. Patient not taking: Reported on 10/24/2019 09/19/19   Jerrol Banana., MD  rosuvastatin (CRESTOR) 10 MG tablet Take 1 tablet (10 mg total) by mouth at bedtime. 01/24/17   Jerrol Banana., MD  sertraline (ZOLOFT) 50 MG tablet TAKE 1 TABLET BY MOUTH EVERY DAY Patient taking differently: Take 50 mg by mouth daily.  11/19/19   Doreen Beam, FNP    Physical Exam: Vitals:   12/21/19 1450 12/21/19 1500 12/21/19 1530 12/21/19 1600  BP: (!) 173/77 (!) 169/80 (!) 166/58 (!) 178/81  Pulse: (!) 101 70 63 79  Resp:  15 12   Temp:      SpO2: 100% 100% 100% 99%  Weight:      Height:         Vitals:   12/21/19 1450 12/21/19 1500 12/21/19 1530 12/21/19 1600  BP: (!) 173/77 (!) 169/80 (!)  166/58 (!) 178/81  Pulse: (!) 101 70 63 79  Resp:  15 12   Temp:      SpO2: 100% 100% 100% 99%  Weight:      Height:        Constitutional: NAD, alert and oriented only to person.  Not to place or time Eyes: PERRL, lids and conjunctivae normal ENMT: Mucous membranes are moist.  Neck: normal, supple, no masses, no thyromegaly Respiratory: clear to auscultation bilaterally, no wheezing, no crackles. Normal respiratory effort. No accessory muscle use.  Cardiovascular: Regular rate and rhythm, no murmurs / rubs / gallops. No extremity edema. 2+ pedal pulses. No carotid bruits.  Abdomen: no tenderness, no masses palpated. No hepatosplenomegaly. Bowel sounds positive.  Musculoskeletal: no clubbing / cyanosis. No joint deformity upper and lower extremities.  Skin: no rashes, lesions, ulcers.  Neurologic: No gross focal neurologic deficit.  Able to move all extremities Psychiatric: Normal mood and affect.   Labs on Admission: I have personally reviewed following labs and imaging studies  CBC: Recent Labs  Lab 12/21/19 1134  WBC 5.9  NEUTROABS 2.7  HGB 13.9  HCT 43.1  MCV 89.4  PLT 387   Basic Metabolic Panel: Recent Labs  Lab 12/21/19 1134  NA 138  K 4.1  CL 104  CO2 25  GLUCOSE 116*  BUN 13  CREATININE 0.75  CALCIUM 9.1   GFR: Estimated Creatinine Clearance: 43.6 mL/min (by C-G formula based on SCr of 0.75 mg/dL). Liver Function Tests: Recent Labs  Lab 12/21/19 1134  AST 19  ALT 19  ALKPHOS 85  BILITOT 0.7  PROT 7.7  ALBUMIN 4.5   No results for input(s): LIPASE, AMYLASE in the last 168 hours. No results for input(s): AMMONIA in the last 168 hours. Coagulation Profile: Recent Labs  Lab 12/21/19 1134  INR 0.9   Cardiac Enzymes: No results for input(s): CKTOTAL, CKMB, CKMBINDEX, TROPONINI in the last 168 hours. BNP (last 3 results) No results for input(s): PROBNP in the last 8760 hours. HbA1C: No results for input(s): HGBA1C in the last 72  hours. CBG: No results for input(s): GLUCAP in the last 168 hours. Lipid Profile: No results for input(s): CHOL, HDL, LDLCALC, TRIG, CHOLHDL, LDLDIRECT in the  last 72 hours. Thyroid Function Tests: No results for input(s): TSH, T4TOTAL, FREET4, T3FREE, THYROIDAB in the last 72 hours. Anemia Panel: No results for input(s): VITAMINB12, FOLATE, FERRITIN, TIBC, IRON, RETICCTPCT in the last 72 hours. Urine analysis: No results found for: COLORURINE, APPEARANCEUR, LABSPEC, [place], GLUCOSEU, [place], [place], [place], PROTEINUR, UROBILINOGEN, NITRITE, LEUKOCYTESUR  Radiological Exams on Admission: CT HEAD WO CONTRAST  Result Date: 12/21/2019 CLINICAL DATA:  Neuro deficit, acute stroke suspected.  Aphasia EXAM: CT HEAD WITHOUT CONTRAST TECHNIQUE: Contiguous axial images were obtained from the base of the skull through the vertex without intravenous contrast. COMPARISON:  CT head 11/23/2019 FINDINGS: Brain: No evidence of acute infarction, hemorrhage, hydrocephalus, extra-axial collection or mass lesion/mass effect. Similar patchy white matter hypoattenuation, compatible with chronic microvascular ischemic disease. Similar mild generalized cerebral volume loss with ex vacuo ventricular dilation. Vascular: Calcific atherosclerosis. Skull: No acute fracture. Sinuses/Orbits: No acute findings. Other: No mastoid effusions IMPRESSION: 1. No evidence of acute intracranial abnormality. 2. Similar generalized cerebral atrophy and chronic microvascular ischemic change. 3. Suspected chronic watershed territory cortical infarcts in the right were better appreciated on prior MRI. Electronically Signed   By: Margaretha Sheffield MD   On: 12/21/2019 12:00    EKG: Independently reviewed.  Normal sinus rhythm  Assessment/Plan Principal Problem:   TIA (transient ischemic attack) Active Problems:   Clinical depression   Adult hypothyroidism   Alzheimer's dementia ([place])   Hypertension     Transient ischemic  attack Rule out acute CVA Patient was brought to the ER for evaluation of right-sided facial droop, slurred speech and unsteady gait which lasted about 3 hours and resolved She has had episodes like this in the past according to her son but none of the episodes lasted as long as this episode We will obtain MRI of the brain to rule out an acute stroke We will obtain 2D echocardiogram to rule out cardiac thrombus We will allow for permissive hypertension until an acute stroke is ruled out Continue aspirin and statin We will request physical therapy/speech therapy and occupational therapy consult We will consult neurology     Dementia Continue donepezil    Hypothyroidism Continue Synthroid   Depression Continue sertraline and Aripiprazole    DVT prophylaxis:  SCD Code Status: Full code Family Communication: Greater than 50% of time was spent discussing patient's condition and plan of care with her son, Shatisha Falter at the bedside. All questions and concerns were addressed. He is concerned about her safety at home since she lives alone and will want her placed in a SNF upon discharge.  Disposition Plan: Back to previous home environment Consults called: Neurology/Case management    Amulya Quintin MD [facility]     12/21/2019, 4:54 PM

## 2019-12-21 NOTE — Procedures (Signed)
Patient Name: Paige Baldwin  MRN: [id number]  Epilepsy Attending: Lora Havens  Referring Physician/Provider: Dr Donnetta Simpers Date: 12/01/2019  Duration: 26.11 mins  Patient history: 74 y.o. female with PMH significant for dementia, SAH, GERD, HLD, HTN, Hypothyroidism who presents with an episode of slurred speech, stumbling and R facial droop that lasted 3 hours. EEG to evaluate for seizure.  Level of alertness: Awake  AEDs during EEG study: None  Technical aspects: This EEG study was done with scalp electrodes positioned according to the 10-20 International system of electrode placement. Electrical activity was acquired at a sampling rate of 500Hz  and reviewed with a high frequency filter of 70Hz  and a low frequency filter of 1Hz . EEG data were recorded continuously and digitally stored.   Description: The posterior dominant rhythm consists of 8 Hz activity of moderate voltage (25-35 uV) seen predominantly in posterior head regions, symmetric and reactive to eye opening and eye closing. EEG showed continuous generalized 5 to 6 Hz theta slowing. Hyperventilation and photic stimulation were not performed.     ABNORMALITY -Continuous slow, generalized  IMPRESSION: This study is suggestive of mild diffuse encephalopathy, nonspecific etiology. No seizures or epileptiform discharges were seen throughout the recording.  Maguire Killmer Barbra Sarks

## 2019-12-21 NOTE — TOC Initial Note (Signed)
Transition of Care [facility]) - Initial/Assessment Note    Patient Details  Name: Paige Baldwin MRN: [id number] Date of Birth: [birth year]  Transition of [facility]) CM/SW Contact:    Iona Beard, [place] Phone Number: [phone] 12/21/2019, 5:52 PM  Clinical Narrative:                 Pt admitted for TIA. TOC received consult due to pts worsening dementia and unsafe living alone. CSW spoke with pts son Rashauna Tep [phone] to complete assessment due to pt only being oriented to person. Mr. Chamber states that pt lives alone currently. Mrs. Guizar states that pts husband due to health issues was at [facility] for rehab, when he came home he was unable to care for pt due to worsening of pts dementia, it was too difficult both emotionally and physically. Due to this pts family placed pts husband at [facility] where he currently resides. Due to this pt has been living alone for months. The families plan is for the pt to go to long term care after SNF and for pts husband to return home. Mr. Ange states that pt does drive though the family has a GPS in the truck to keep track of where she is because she refuses to give up her keys. Mr. Stegeman states that they have never had HH and the pt does not use any DME. Mr. Kaelin states that the family has been looking for placement for pt. The family is trying to find placement that is affordable for long term memory care. Mr. Roszak states that they are being selective because they want to ensure that pt is being cared for well. Mr. Vigeant states that they have been looking at [facility] and as a family are trying to figure out how they could financially make it work. Mr. Schurman states that he is unsure if pt has been taking meds as prescribed though pt states she is. Pts family provides her food weekly. Mr. Byer states that at pts last neurology appt with Dr. Manuella Ghazi it was recommended that someone come into the home to distribute medications to pt daily. Mr. Kassab  states that pt stated she did not need any help, she can take care of herself, and is independent. However, Mr. Kivett states pt will also say that she needs help.   CSW informed Mr. Baltes that TOC will continue to follow pt during hospital stay. CSW explained SNF process to Mr. Petraglia who is agreeable to pt going to SNF with hopes of transitioning to long term care after. CSW explained that TOC will need PT evaluation and upon that a referral can be sent out to local facilities that family is okay with and they will be updated when bed offers are made. CSW explained that TOC will follow up with Mr. Digiulio about what will further happen. TOC to follow.   Expected Discharge Plan: Skilled Nursing Facility Barriers to Discharge: Continued Medical Work up   Patient Goals and CMS Choice Patient states their goals for this hospitalization and ongoing recovery are:: Go to SNF for rehab   Choice offered to / list presented to : NA  Expected Discharge Plan and Services Expected Discharge Plan: [place] In-house Referral: Clinical Social Work Discharge Planning Services: NA Post Acute Care Choice: [place] Living arrangements for the past 2 months: Single Family Home                 DME Arranged:  N/A DME Agency: NA       HH Arranged: NA HH Agency: NA        Prior Living Arrangements/Services Living arrangements for the past 2 months: [place] with:: Self   Do you feel safe going back to the place where you live?: No   Per pts son, pt has progressing dementia and is not safe at home.  Need for Family Participation in Patient Care: Yes (Comment) (Pt with progressing dementia, oriented to person only.) Care giver support system in place?: Yes (comment) (Demers,Benjamin E (Son) [phone])   Criminal Activity/Legal Involvement Pertinent to Current Situation/Hospitalization: No - Comment as needed  Activities of Daily Living      Permission  Sought/Granted                  Emotional Assessment       Orientation: : Oriented to Self Alcohol / Substance Use: Not Applicable Psych Involvement: No (comment)  Admission diagnosis:  TIA (transient ischemic attack) [G45.9] Patient Active Problem List   Diagnosis Date Noted  . TIA (transient ischemic attack) 12/21/2019  . Hypertension   . Late onset Alzheimer's disease without behavioral disturbance ([place]) 08/31/2019  . Depression, major, single episode, complete remission ([place]) 08/31/2019  . Alzheimer's dementia ([place]) 03/24/2018  . Anxiety 07/12/2014  . Clinical depression 07/12/2014  . Acid reflux 07/12/2014  . Autoimmune lymphocytic chronic thyroiditis 07/12/2014  . HLD (hyperlipidemia) 07/12/2014  . Essential hypertension 07/12/2014  . Adult hypothyroidism 07/12/2014  . Mild major depression ([place]) 07/12/2014  . Arthritis, degenerative 07/12/2014  . Post menopausal syndrome 07/12/2014  . CMC arthritis, thumb, degenerative 06/25/2014   PCP:  Jerrol Banana., MD Pharmacy:   [place], [place] [address] Phone: [phone] Fax: [phone]  [id number] - [place], [place] - [address] [place] [address] Phone: [phone] Fax: [phone]  [id number] - [place], [place] [address] [place] [address] Phone: [phone] Fax: [phone]     Social Determinants of Health (SDOH) Interventions    Readmission Risk Interventions No flowsheet data found.

## 2019-12-21 NOTE — ED Notes (Signed)
EEG still in progress. Will give meds when complete.

## 2019-12-21 NOTE — ED Notes (Signed)
EEG at bedside.

## 2019-12-21 NOTE — ED Triage Notes (Signed)
Brought by family member who states he noticed she had mouth drawing and slurred speech this am.  Unsure when last known well.  Possible yesterday or prior.  She is alert with history of demenia.  She is able to hold both arms out with weaker on right.  Her face is symetrical now.

## 2019-12-21 NOTE — Consult Note (Signed)
NEUROLOGY CONSULTATION NOTE   Date of service: December 21, 2019 Patient Name: Paige Baldwin MRN:  [id number] DOB:  Jul 03, 1945 Reason for consult: "TIA"  History of Present Illness  Paige Baldwin is a 74 y.o. female with PMH significant for dementia, SAH, GERD, HLD, HTN, Hypothyroidism who presents with an episode of slurred speech, stumbling and R facial droop that lasted 3 hours.  Patient was scheduled for EEG this morning for episodes of unresponsiveness. Patient's daughter called her to remind her on the appointment and her speech was slurred on the phone. Son called her a little later and again noted that patient had slurred speech. Son got to get her home to take her to the appointment and saw that she had a half smile and could not move her R face. She was very lethargic, she was however, ready to go to the appointment. On the way to the car, she stumbled a little bit and reported some pain down her L hand and L fingers.  Symptoms resolved a few minutes after she got to the appointment and the staff brought her to the ED for further evaluation.  Son reports that family has seen brief episodes of confusion. These have been witnessed by patient's daughter but not by her son. Son is not sure what ehese episode exactly are. I was unable to get the patient to elaborate on these.    ROS   Constitutional Denies weight loss, fever and chills.  HEENT Denies changes in vision and hearing.  Respiratory Denies SOB and cough.  CV Denies palpitations and CP  GI Denies abdominal pain, nausea, vomiting and diarrhea.  GU Denies dysuria and urinary frequency.  MSK Denies myalgia and joint pain.  Skin Denies rash and pruritus.  Neurological Denies headache and syncope.  Psychiatric Denies recent changes in mood. Denies anxiety and depression.   Past History   Past Medical History:  Diagnosis Date  . Dementia ([place])   . Depression   . GERD (gastroesophageal reflux disease)   . Hyperlipidemia    . Hypertension   . Hypothyroidism    Past Surgical History:  Procedure Laterality Date  . ABDOMINAL HYSTERECTOMY    . APPENDECTOMY     during cholecystectomy  . BACK SURGERY    . CHOLECYSTECTOMY     removed appendix at this time  . COLONOSCOPY WITH PROPOFOL N/A 06/30/2015   Procedure: COLONOSCOPY WITH PROPOFOL;  Surgeon: Manya Silvas, MD;  Location: [facility] ENDOSCOPY;  Service: Endoscopy;  Laterality: N/A;tubular adenoma repeat 06/2020  . FINGER ARTHROPLASTY Right 06/25/2014   Procedure: FINGER ARTHROPLASTY;  Surgeon: Christophe Louis, MD;  Location: [facility] ORS;  Service: Orthopedics;  Laterality: Right;  . SALPINGOOPHORECTOMY     Family History  Problem Relation Age of Onset  . Hypertension Father   . Heart disease Father   . Diabetes Father   . Congestive Heart Failure Father   . Stroke Paternal Uncle   . Hypertension Brother   . CAD Brother   . Anemia Brother   . Breast cancer Maternal Aunt    Social History   Socioeconomic History  . Marital status: Married    Spouse name: Not on file  . Number of children: 2  . Years of education: Not on file  . Highest education level: Some college, no degree  Occupational History  . Occupation: retired  Tobacco Use  . Smoking status: Never Smoker  . Smokeless tobacco: Never Used  Substance and Sexual Activity  . Alcohol use:  No  . Drug use: No  . Sexual activity: Not on file  Other Topics Concern  . Not on file  Social History Narrative  . Not on file   Social Determinants of Health   Financial Resource Strain:   . Difficulty of Paying Living Expenses: Not on file  Food Insecurity:   . Worried About Charity fundraiser in the Last Year: Not on file  . Ran Out of Food in the Last Year: Not on file  Transportation Needs:   . Lack of Transportation (Medical): Not on file  . Lack of Transportation (Non-Medical): Not on file  Physical Activity:   . Days of Exercise per Week: Not on file  . Minutes of Exercise per Session:  Not on file  Stress:   . Feeling of Stress : Not on file  Social Connections:   . Frequency of Communication with Friends and Family: Not on file  . Frequency of Social Gatherings with Friends and Family: Not on file  . Attends Religious Services: Not on file  . Active Member of Clubs or Organizations: Not on file  . Attends Archivist Meetings: Not on file  . Marital Status: Not on file   No Known Allergies  Medications  (Not in a hospital admission)    Vitals   Vitals:   12/21/19 1126 12/21/19 1127 12/21/19 1310 12/21/19 1311  BP: 136/68  (!) 187/90   Pulse: 66   70  Resp: 18   18  Temp: 98 F (36.7 C)     SpO2: 95%   100%  Weight:  54.4 kg    Height:  5' (1.524 m)       Body mass index is 23.44 kg/m.  Physical Exam   General: Laying comfortably in bed; in no acute distress. HENT: Normal oropharynx and mucosa. Normal external appearance of ears and nose Neck: Supple, no pain or tenderness  CV: No JVD. No peripheral edema.  Pulmonary: Symmetric Chest rise. Normal respiratory effort.  Abdomen: Soft to touch, non-tender.  Ext: No cyanosis, edema, or deformity  Skin: No rash. Normal palpation of skin.   Musculoskeletal: Normal digits and nails by inspection. No clubbing.   Neurologic Examination  Mental status/Cognition: Alert, oriented to self, place, month and year, good attention.  Speech/language: Fluent, comprehension intact, object naming intact, repetition intact.  Cranial nerves:   CN II Pupils equal and reactive to light, no VF deficits    CN III,IV,VI EOM intact, no gaze preference or deviation, no nystagmus    CN V normal sensation in V1, V2, and V3 segments bilaterally    CN VII no asymmetry, no nasolabial fold flattening    CN VIII normal hearing to speech    CN IX & X normal palatal elevation, no uvular deviation    CN XI 5/5 head turn and 5/5 shoulder shrug bilaterally    CN XII midline tongue protrusion    Motor:  Muscle bulk: poor,  tone normal, pronator drift none tremor none Mvmt Root Nerve  Muscle Right Left Comments  SA C5/6 Ax Deltoid 5 5   EF C5/6 Mc Biceps 5 5   EE C6/7/8 Rad Triceps 5 5   WF C6/7 Med FCR 5 5   WE C7/8 PIN ECU 5 5   F Ab C8/T1 U ADM/FDI 5 5   HF L1/2/3 Fem Illopsoas 5 5   KE L2/3/4 Fem Quad 5 5   DF L4/5 D Peron Tib Ant 5 5  PF S1/2 Tibial Grc/Sol 5 5    Reflexes:  Right Left Comments  Pectoralis + +    Biceps (C5/6) 2+ 2+   Brachioradialis (C5/6) 2+ 2+    Triceps (C6/7) 2+ 2+    Patellar (L3/4) 3 3    Achilles (S1) 2 2    Hoffman - -    Plantar down down   Jaw jerk    Sensation:  Light touch Intact throughout   Pin prick Intact throughout   Temperature    Vibration   Proprioception    Coordination/Complex Motor:  - Finger to Nose intact BL - Heel to shin intact BL - Rapid alternating movement are normal - Gait: deferred.  Labs   CBC:  Recent Labs  Lab 12/21/19 1134  WBC 5.9  NEUTROABS 2.7  HGB 13.9  HCT 43.1  MCV 89.4  PLT 170    Basic Metabolic Panel:  Lab Results  Component Value Date   NA 138 12/21/2019   K 4.1 12/21/2019   CO2 25 12/21/2019   GLUCOSE 116 (H) 12/21/2019   BUN 13 12/21/2019   CREATININE 0.75 12/21/2019   CALCIUM 9.1 12/21/2019   GFRNONAA >60 12/21/2019   GFRAA >60 11/23/2019   Lipid Panel:  Lab Results  Component Value Date   LDLCALC 87 03/03/2017   HgbA1c:  Lab Results  Component Value Date   HGBA1C 5.8 (H) 03/03/2017   Urine Drug Screen: No results found for: LABOPIA, COCAINSCRNUR, LABBENZ, AMPHETMU, THCU, LABBARB  Alcohol Level No results found for: [facility]   CT HEAD WO CONTRAST  Narrative CLINICAL DATA:  Neuro deficit, acute stroke suspected.  Aphasia  EXAM: CT HEAD WITHOUT CONTRAST  TECHNIQUE: Contiguous axial images were obtained from the base of the skull through the vertex without intravenous contrast.  COMPARISON:  CT head 11/23/2019  FINDINGS: Brain: No evidence of acute infarction, hemorrhage,  hydrocephalus, extra-axial collection or mass lesion/mass effect. Similar patchy white matter hypoattenuation, compatible with chronic microvascular ischemic disease. Similar mild generalized cerebral volume loss with ex vacuo ventricular dilation.  Vascular: Calcific atherosclerosis.  Skull: No acute fracture.  Sinuses/Orbits: No acute findings.  Other: No mastoid effusions  IMPRESSION: 1. No evidence of acute intracranial abnormality. 2. Similar generalized cerebral atrophy and chronic microvascular ischemic change. 3. Suspected chronic watershed territory cortical infarcts in the right were better appreciated on prior MRI.   Impression   MAHALEY SCHWERING is a 74 y.o. female with PMH significant for dementia, SAH, GERD, HLD, HTN, Hypothyroidism who presents with an episode of slurred speech, stumbling and R facial droop that lasted 3 hours. Her neurologic examination is notable for some underlying executive dysfunction but no focal deficit althou she reports subjective sensation alteration in her L hand.  I do not have reliable history on her. Patient lives by herslef and unable to contribute to the history in any meaningful way. Son is not able to characterize the events that she has had in the past. Differential includes focal seizure vs TIA. I would not expect cord abnormality to cause a transient facial droop.  Recommendations    - Recommend brain imaging with MRI Brain without contrast - Recommend Vascular imaging with MRA Angio Head without contrast and US Carotid doppler - Recommend obtaining TTE - Recommend obtaining Lipid panel with LDL - Please start statin if LDL > 70 - Recommend HbA1c - Antithrombotic - aspirin 81mg  daily - Recommend DVT ppx - SBP goal - permissive hypertension first 24 h < 220/110. Held home  meds.  - Recommend Telemetry monitoring for arrythmia - Recommend bedside swallow screen prior to PO intake. - Stroke education booklet - Recommend  PT/OT/SLP consult - routine EEG.   ______________________________________________________________________   Thank you for the opportunity to take part in the care of this patient. If you have any further questions, please contact the neurology consultation attending.  Signed,  [place] Pager Number [phone]

## 2019-12-22 ENCOUNTER — Inpatient Hospital Stay: Payer: Medicare Other

## 2019-12-22 ENCOUNTER — Encounter: Payer: Self-pay | Admitting: Internal Medicine

## 2019-12-22 ENCOUNTER — Observation Stay
Admit: 2019-12-22 | Discharge: 2019-12-22 | Disposition: A | Discharge status: Home / Self Care | Payer: Medicare Other | Attending: Internal Medicine | Admitting: Internal Medicine

## 2019-12-22 DIAGNOSIS — G308 Other Alzheimer's disease: Secondary | ICD-10-CM | POA: Diagnosis not present

## 2019-12-22 DIAGNOSIS — I6389 Other cerebral infarction: Secondary | ICD-10-CM | POA: Diagnosis not present

## 2019-12-22 DIAGNOSIS — F0151 Vascular dementia with behavioral disturbance: Secondary | ICD-10-CM | POA: Diagnosis present

## 2019-12-22 DIAGNOSIS — G309 Alzheimer's disease, unspecified: Secondary | ICD-10-CM | POA: Diagnosis present

## 2019-12-22 DIAGNOSIS — R451 Restlessness and agitation: Secondary | ICD-10-CM | POA: Diagnosis present

## 2019-12-22 DIAGNOSIS — F0281 Dementia in other diseases classified elsewhere with behavioral disturbance: Secondary | ICD-10-CM | POA: Diagnosis not present

## 2019-12-22 DIAGNOSIS — I6601 Occlusion and stenosis of right middle cerebral artery: Secondary | ICD-10-CM | POA: Diagnosis not present

## 2019-12-22 DIAGNOSIS — I6621 Occlusion and stenosis of right posterior cerebral artery: Secondary | ICD-10-CM | POA: Diagnosis not present

## 2019-12-22 DIAGNOSIS — F419 Anxiety disorder, unspecified: Secondary | ICD-10-CM | POA: Diagnosis present

## 2019-12-22 DIAGNOSIS — E785 Hyperlipidemia, unspecified: Secondary | ICD-10-CM | POA: Diagnosis present

## 2019-12-22 DIAGNOSIS — Z90710 Acquired absence of both cervix and uterus: Secondary | ICD-10-CM | POA: Diagnosis not present

## 2019-12-22 DIAGNOSIS — R29810 Facial weakness: Secondary | ICD-10-CM | POA: Diagnosis present

## 2019-12-22 DIAGNOSIS — Z79899 Other long term (current) drug therapy: Secondary | ICD-10-CM | POA: Diagnosis not present

## 2019-12-22 DIAGNOSIS — G301 Alzheimer's disease with late onset: Secondary | ICD-10-CM | POA: Diagnosis not present

## 2019-12-22 DIAGNOSIS — Z9079 Acquired absence of other genital organ(s): Secondary | ICD-10-CM | POA: Diagnosis not present

## 2019-12-22 DIAGNOSIS — F03918 Unspecified dementia, unspecified severity, with other behavioral disturbance: Secondary | ICD-10-CM | POA: Diagnosis present

## 2019-12-22 DIAGNOSIS — Z9049 Acquired absence of other specified parts of digestive tract: Secondary | ICD-10-CM | POA: Diagnosis not present

## 2019-12-22 DIAGNOSIS — I639 Cerebral infarction, unspecified: Secondary | ICD-10-CM | POA: Diagnosis not present

## 2019-12-22 DIAGNOSIS — G459 Transient cerebral ischemic attack, unspecified: Secondary | ICD-10-CM | POA: Diagnosis not present

## 2019-12-22 DIAGNOSIS — I63311 Cerebral infarction due to thrombosis of right middle cerebral artery: Secondary | ICD-10-CM | POA: Diagnosis not present

## 2019-12-22 DIAGNOSIS — E063 Autoimmune thyroiditis: Secondary | ICD-10-CM | POA: Diagnosis present

## 2019-12-22 DIAGNOSIS — Z90722 Acquired absence of ovaries, bilateral: Secondary | ICD-10-CM | POA: Diagnosis not present

## 2019-12-22 DIAGNOSIS — E039 Hypothyroidism, unspecified: Secondary | ICD-10-CM | POA: Diagnosis not present

## 2019-12-22 DIAGNOSIS — R2681 Unsteadiness on feet: Secondary | ICD-10-CM | POA: Diagnosis present

## 2019-12-22 DIAGNOSIS — Z20822 Contact with and (suspected) exposure to covid-19: Secondary | ICD-10-CM | POA: Diagnosis present

## 2019-12-22 DIAGNOSIS — I10 Essential (primary) hypertension: Secondary | ICD-10-CM | POA: Diagnosis present

## 2019-12-22 DIAGNOSIS — F32A Depression, unspecified: Secondary | ICD-10-CM | POA: Diagnosis present

## 2019-12-22 DIAGNOSIS — I63511 Cerebral infarction due to unspecified occlusion or stenosis of right middle cerebral artery: Secondary | ICD-10-CM | POA: Diagnosis present

## 2019-12-22 DIAGNOSIS — I663 Occlusion and stenosis of cerebellar arteries: Secondary | ICD-10-CM | POA: Diagnosis not present

## 2019-12-22 DIAGNOSIS — R29818 Other symptoms and signs involving the nervous system: Secondary | ICD-10-CM | POA: Diagnosis not present

## 2019-12-22 DIAGNOSIS — K219 Gastro-esophageal reflux disease without esophagitis: Secondary | ICD-10-CM | POA: Diagnosis present

## 2019-12-22 DIAGNOSIS — Z79890 Hormone replacement therapy (postmenopausal): Secondary | ICD-10-CM | POA: Diagnosis not present

## 2019-12-22 DIAGNOSIS — F0280 Dementia in other diseases classified elsewhere without behavioral disturbance: Secondary | ICD-10-CM | POA: Diagnosis present

## 2019-12-22 DIAGNOSIS — I63531 Cerebral infarction due to unspecified occlusion or stenosis of right posterior cerebral artery: Secondary | ICD-10-CM | POA: Diagnosis not present

## 2019-12-22 DIAGNOSIS — R4701 Aphasia: Secondary | ICD-10-CM | POA: Diagnosis present

## 2019-12-22 LAB — ECHOCARDIOGRAM COMPLETE
AR max vel: 1.16 cm2
AV Peak grad: 9.6 mmHg
Ao pk vel: 1.55 m/s
Area-P 1/2: 3.34 cm2
Calc EF: 66.9 %
Height: 60 in
S' Lateral: 2.61 cm
Single Plane A2C EF: 64.8 %
Single Plane A4C EF: 69 %
Weight: 1920 oz

## 2019-12-22 LAB — LIPID PANEL
Cholesterol: 203 mg/dL — ABNORMAL HIGH (ref 0–200)
HDL: 51 mg/dL (ref >40)
LDL Cholesterol: 137 mg/dL — ABNORMAL HIGH (ref 0–99)
Total CHOL/HDL Ratio: 4 RATIO
Triglycerides: 74 mg/dL (ref <150)
VLDL: 15 mg/dL (ref 0–40)

## 2019-12-22 MED ORDER — HALOPERIDOL LACTATE 5 MG/ML IJ SOLN: 2.0000 mg | Freq: Two times a day (BID) | INTRAMUSCULAR | Status: DC | PRN

## 2019-12-22 MED ORDER — ATORVASTATIN CALCIUM 20 MG PO TABS
40.0000 mg | ORAL_TABLET | Freq: Every day | ORAL | Status: DC
  Administered 2019-12-22: 40 mg via ORAL
  Filled 2019-12-22: qty 2

## 2019-12-22 MED ORDER — LORAZEPAM 2 MG/ML IJ SOLN: 1.0000 mg | Freq: Two times a day (BID) | INTRAMUSCULAR | Status: DC | PRN

## 2019-12-22 MED ORDER — LAMOTRIGINE 100 MG PO TABS
50.0000 mg | ORAL_TABLET | Freq: Every day | ORAL | Status: DC
  Administered 2019-12-22 – 2019-12-24 (×3): 50 mg via ORAL
  Filled 2019-12-22 (×3): qty 1

## 2019-12-22 MED ORDER — RISPERIDONE 0.5 MG PO TABS
0.5000 mg | ORAL_TABLET | Freq: Every day | ORAL | Status: DC | PRN
  Filled 2019-12-22: qty 1

## 2019-12-22 MED ORDER — LORAZEPAM 2 MG/ML IJ SOLN: 1.0000 mg | Freq: Once | INTRAMUSCULAR | Status: DC

## 2019-12-22 NOTE — TOC Progression Note (Signed)
Transition of Care [facility]) - Progression Note    Patient Details  Name: Paige Baldwin MRN: [id number] Date of Birth: Jan 18, 1946  Transition of Care [facility]) CM/SW Contact  Izola Price, RN Phone Number: 12/22/2019, 2:54 PM  Clinical Narrative:   10/30 1430 Provider notes indicate need for inpatient care. Currently has 1:1 sitter, confused and a psychiatric location consult in notes. Simmie Davies RN CM.     Expected Discharge Plan: [place] Barriers to Discharge: Continued Medical Work up  Expected Discharge Plan and Services Expected Discharge Plan: [place] In-house Referral: Clinical Social Work Discharge Planning Services: NA Post Acute Care Choice: [place] Living arrangements for the past 2 months: Single Family Home                 DME Arranged: N/A DME Agency: NA       HH Arranged: NA HH Agency: NA         Social Determinants of Health (SDOH) Interventions    Readmission Risk Interventions No flowsheet data found.

## 2019-12-22 NOTE — Progress Notes (Signed)
SLP Cancellation Note  Patient Details Name: Paige Baldwin MRN: [id number] DOB: 1945-06-16   Cancelled treatment:       Reason Eval/Treat Not Completed: Patient at procedure or test/unavailable  Leroy Sea, MS/CCC- SLP  Lou Miner 12/22/2019, 11:47 AM

## 2019-12-22 NOTE — Consult Note (Signed)
Paige Baldwin Psychiatry Consult   Reason for Consult:  Dementia with behavioral disturbances Referring Physician:  Dr. Maryland Pink Patient Identification: Paige Baldwin MRN:  [id number] Principal Diagnosis: TIA (transient ischemic attack) Diagnosis:  Principal Problem:   TIA (transient ischemic attack) Active Problems:   Agitation   Dementia with behavioral disturbance ([place])   Clinical depression   Adult hypothyroidism   Alzheimer's dementia ([place])   Hypertension   Total Time spent with patient: 45 minutes  Subjective:   Paige Baldwin is a 74 y.o. female patient admitted with "TIA", history of dementia.  HPI:  Patient seen and evaluated by this provider. Patient states "I'm doing good." Patient stated she had no concerns at this moment except to leave this "building and to come back later." Patient not able to name building she is in, thought the month was November and unable to name the President of the [place], patient stated "his name starts with a B." Patient daughter in room and patient gave permission to speak with her, daughter brought up that her mom has been having an increase in confusion and taking a lot of money out of her bank account and "losing it in the house, somewhere." When daughter mentioned confusion, the patient became argumentative and said that was "a lie" and "I am very intelligent." Patient daughter did state her confusion gets worse later in the day and she becomes very "restless." Last night, she was agitated and required PRN medications. No suicidal/homicidal ideations, hallucinations, and substance abuse.  Past Psychiatric History: dementia  Risk to Self: Denies Risk to Others: Denies Prior Inpatient Therapy:   Prior Outpatient Therapy:    Past Medical History:  Past Medical History:  Diagnosis Date  . Dementia ([place])   . Depression   . GERD (gastroesophageal reflux disease)   . Hyperlipidemia   . Hypertension   . Hypothyroidism     Past  Surgical History:  Procedure Laterality Date  . ABDOMINAL HYSTERECTOMY    . APPENDECTOMY     during cholecystectomy  . BACK SURGERY    . CHOLECYSTECTOMY     removed appendix at this time  . COLONOSCOPY WITH PROPOFOL N/A 06/30/2015   Procedure: COLONOSCOPY WITH PROPOFOL;  Surgeon: Manya Silvas, MD;  Location: [facility] ENDOSCOPY;  Service: Endoscopy;  Laterality: N/A;tubular adenoma repeat 06/2020  . FINGER ARTHROPLASTY Right 06/25/2014   Procedure: FINGER ARTHROPLASTY;  Surgeon: Christophe Louis, MD;  Location: [facility] ORS;  Service: Orthopedics;  Laterality: Right;  . SALPINGOOPHORECTOMY     Family History:  Family History  Problem Relation Age of Onset  . Hypertension Father   . Heart disease Father   . Diabetes Father   . Congestive Heart Failure Father   . Stroke Paternal Uncle   . Hypertension Brother   . CAD Brother   . Anemia Brother   . Breast cancer Maternal Aunt    Family Psychiatric  History: See Above Social History:  Social History   Substance and Sexual Activity  Alcohol Use No     Social History   Substance and Sexual Activity  Drug Use No    Social History   Socioeconomic History  . Marital status: Married    Spouse name: Not on file  . Number of children: 2  . Years of education: Not on file  . Highest education level: Some college, no degree  Occupational History  . Occupation: retired  Tobacco Use  . Smoking status: Never Smoker  . Smokeless tobacco: Never Used  Substance and Sexual Activity  . Alcohol use: No  . Drug use: No  . Sexual activity: Not on file  Other Topics Concern  . Not on file  Social History Narrative  . Not on file   Social Determinants of Health   Financial Resource Strain:   . Difficulty of Paying Living Expenses: Not on file  Food Insecurity:   . Worried About Charity fundraiser in the Last Year: Not on file  . Ran Out of Food in the Last Year: Not on file  Transportation Needs:   . Lack of Transportation  (Medical): Not on file  . Lack of Transportation (Non-Medical): Not on file  Physical Activity:   . Days of Exercise per Week: Not on file  . Minutes of Exercise per Session: Not on file  Stress:   . Feeling of Stress : Not on file  Social Connections:   . Frequency of Communication with Friends and Family: Not on file  . Frequency of Social Gatherings with Friends and Family: Not on file  . Attends Religious Services: Not on file  . Active Member of Clubs or Organizations: Not on file  . Attends Archivist Meetings: Not on file  . Marital Status: Not on file   Additional Social History:    Allergies:  No Known Allergies  Labs:  Results for orders placed or performed during the hospital encounter of 12/21/19 (from the past 48 hour(s))  Protime-INR     Status: None   Collection Time: 12/21/19 11:34 AM  Result Value Ref Range   Prothrombin Time 12.2 11.4 - 15.2 seconds   INR 0.9 0.8 - 1.2    Comment: (NOTE) INR goal varies based on device and disease states. Performed at [facility], [address] [place], [place] [address]   APTT     Status: None   Collection Time: 12/21/19 11:34 AM  Result Value Ref Range   aPTT 30 24 - 36 seconds    Comment: Performed at [facility], [address] [place], [place] [address]  CBC     Status: None   Collection Time: 12/21/19 11:34 AM  Result Value Ref Range   WBC 5.9 4.0 - 10.5 K/uL   RBC 4.82 3.87 - 5.11 MIL/uL   Hemoglobin 13.9 12.0 - 15.0 g/dL   HCT 43.1 36 - 46 %   MCV 89.4 80.0 - 100.0 fL   MCH 28.8 26.0 - 34.0 pg   MCHC 32.3 30.0 - 36.0 g/dL   RDW 13.1 11.5 - 15.5 %   Platelets 209 150 - 400 K/uL   nRBC 0.0 0.0 - 0.2 %    Comment: Performed at [facility], [address] [place], [place] [address]  Differential     Status: Abnormal   Collection Time: 12/21/19 11:34 AM  Result Value Ref Range   Neutrophils Relative % 47 %   Neutro Abs 2.7 1.7 - 7.7 K/uL   Lymphocytes Relative  22 %   Lymphs Abs 1.3 0.7 - 4.0 K/uL   Monocytes Relative 30 %   Monocytes Absolute 1.7 (H) 0.1 - 1.0 K/uL   Eosinophils Relative 0 %   Eosinophils Absolute 0.0 0.0 - 0.5 K/uL   Basophils Relative 0 %   Basophils Absolute 0.0 0.0 - 0.1 K/uL   Immature Granulocytes 1 %   Abs Immature Granulocytes 0.07 0.00 - 0.07 K/uL    Comment: Performed at [facility], [address],  [place], [place] [address]  Comprehensive metabolic panel     Status: Abnormal   Collection Time: 12/21/19 11:34 AM  Result Value Ref Range   Sodium 138 135 - 145 mmol/L   Potassium 4.1 3.5 - 5.1 mmol/L   Chloride 104 98 - 111 mmol/L   CO2 25 22 - 32 mmol/L   Glucose, Bld 116 (H) 70 - 99 mg/dL    Comment: Glucose reference range applies only to samples taken after fasting for at least 8 hours.   BUN 13 8 - 23 mg/dL   Creatinine, Ser 0.75 0.44 - 1.00 mg/dL   Calcium 9.1 8.9 - 10.3 mg/dL   Total Protein 7.7 6.5 - 8.1 g/dL   Albumin 4.5 3.5 - 5.0 g/dL   AST 19 15 - 41 U/L   ALT 19 0 - 44 U/L   Alkaline Phosphatase 85 38 - 126 U/L   Total Bilirubin 0.7 0.3 - 1.2 mg/dL   GFR, Estimated >60 >60 mL/min    Comment: (NOTE) Calculated using the CKD-EPI Creatinine Equation (2021)    Anion gap 9 5 - 15    Comment: Performed at [facility], [address] [place], [place] [address]  Urinalysis, Complete w Microscopic     Status: Abnormal   Collection Time: 12/21/19  3:04 PM  Result Value Ref Range   Color, Urine YELLOW (A) YELLOW   APPearance CLOUDY (A) CLEAR   Specific Gravity, Urine 1.018 1.005 - 1.030   pH 6.0 5.0 - 8.0   Glucose, UA NEGATIVE NEGATIVE mg/dL   Hgb urine dipstick SMALL (A) NEGATIVE   Bilirubin Urine NEGATIVE NEGATIVE   Ketones, ur 20 (A) NEGATIVE mg/dL   Protein, ur NEGATIVE NEGATIVE mg/dL   Nitrite NEGATIVE NEGATIVE   Leukocytes,Ua NEGATIVE NEGATIVE   RBC / HPF 21-50 0 - 5 RBC/hpf   WBC, UA 0-5 0 - 5 WBC/hpf   Bacteria, UA NONE SEEN NONE SEEN   Squamous Epithelial / LPF  21-50 0 - 5   Mucus PRESENT     Comment: Performed at [facility], [address] [place], [place] [address]  Respiratory Panel by RT PCR (Flu A&B, Covid) - Nasopharyngeal Swab     Status: None   Collection Time: 12/21/19  3:04 PM   Specimen: Nasopharyngeal Swab  Result Value Ref Range   SARS Coronavirus 2 by RT PCR NEGATIVE NEGATIVE    Comment: (NOTE) SARS-CoV-2 target nucleic acids are NOT DETECTED.  The SARS-CoV-2 RNA is generally detectable in upper respiratoy specimens during the acute phase of infection. The lowest concentration of SARS-CoV-2 viral copies this assay can detect is 131 copies/mL. A negative result does not preclude SARS-Cov-2 infection and should not be used as the sole basis for treatment or other patient management decisions. A negative result may occur with  improper specimen collection/handling, submission of specimen other than nasopharyngeal swab, presence of viral mutation(s) within the areas targeted by this assay, and inadequate number of viral copies (<131 copies/mL). A negative result must be combined with clinical observations, patient history, and epidemiological information. The expected result is Negative.  Fact Sheet for Patients:  PinkCheek.be  Fact Sheet for Healthcare Providers:  GravelBags.it  This test is no t yet approved or cleared by the [place] FDA and  has been authorized for detection and/or diagnosis of SARS-CoV-2 by FDA under an Emergency Use Authorization (EUA). This EUA will remain  in effect (meaning this test can be used) for the duration of the COVID-19  declaration under Section 564(b)(1) of the Act, 21 U.S.C. section 360bbb-3(b)(1), unless the authorization is terminated or revoked sooner.     Influenza A by PCR NEGATIVE NEGATIVE   Influenza B by PCR NEGATIVE NEGATIVE    Comment: (NOTE) The Xpert Xpress SARS-CoV-2/FLU/RSV assay is intended as an  aid in  the diagnosis of influenza from Nasopharyngeal swab specimens and  should not be used as a sole basis for treatment. Nasal washings and  aspirates are unacceptable for Xpert Xpress SARS-CoV-2/FLU/RSV  testing.  Fact Sheet for Patients: PinkCheek.be  Fact Sheet for Healthcare Providers: GravelBags.it  This test is not yet approved or cleared by the [place] FDA and  has been authorized for detection and/or diagnosis of SARS-CoV-2 by  FDA under an Emergency Use Authorization (EUA). This EUA will remain  in effect (meaning this test can be used) for the duration of the  Covid-19 declaration under Section 564(b)(1) of the Act, 21  U.S.C. section 360bbb-3(b)(1), unless the authorization is  terminated or revoked. Performed at [facility], [address] [place], [place] [address]   Lipid panel     Status: Abnormal   Collection Time: 12/22/19  4:16 AM  Result Value Ref Range   Cholesterol 203 (H) 0 - 200 mg/dL   Triglycerides 74 <150 mg/dL   HDL 51 >40 mg/dL   Total CHOL/HDL Ratio 4.0 RATIO   VLDL 15 0 - 40 mg/dL   LDL Cholesterol 137 (H) 0 - 99 mg/dL    Comment:        Total Cholesterol/HDL:CHD Risk Coronary Heart Disease Risk Table                     Men   Women  1/2 Average Risk   3.4   3.3  Average Risk       5.0   4.4  2 X Average Risk   9.6   7.1  3 X Average Risk  23.4   11.0        Use the calculated Patient Ratio above and the CHD Risk Table to determine the patient's CHD Risk.        ATP III CLASSIFICATION (LDL):  <100     mg/dL   Optimal  100-129  mg/dL   Near or Above                    Optimal  130-159  mg/dL   Borderline  160-189  mg/dL   High  >190     mg/dL   Very High Performed at [facility], [address] [place],  [address]     Current Facility-Administered Medications  Medication Dose Route Frequency Provider Last Rate Last Admin  . 0.9 %   sodium chloride infusion   Intravenous Continuous Collier Bullock, MD   Paused at 12/22/19 2330  . acetaminophen (TYLENOL) tablet 650 mg  650 mg Oral Q4H PRN Agbata, Tochukwu, MD       Or  . acetaminophen (TYLENOL) 160 MG/5ML solution 650 mg  650 mg Per Tube Q4H PRN Agbata, Tochukwu, MD       Or  . acetaminophen (TYLENOL) suppository 650 mg  650 mg Rectal Q4H PRN Agbata, Tochukwu, MD      . ARIPiprazole (ABILIFY) tablet 2 mg  2 mg Oral Daily Agbata, Tochukwu, MD   2 mg at 12/22/19 1217  . aspirin suppository 300 mg  300 mg Rectal Daily Agbata, Tochukwu, MD  Or  . aspirin tablet 325 mg  325 mg Oral Daily Agbata, Tochukwu, MD   325 mg at 12/22/19 1217  . atorvastatin (LIPITOR) tablet 40 mg  40 mg Oral Daily Bonnielee Haff, MD   40 mg at 12/22/19 1218  . donepezil (ARICEPT) tablet 10 mg  10 mg Oral QHS Agbata, Tochukwu, MD   10 mg at 12/21/19 2332  . lamoTRIgine (LAMICTAL) tablet 50 mg  50 mg Oral Daily Bonnielee Haff, MD   50 mg at 12/22/19 1217  . levothyroxine (SYNTHROID) tablet 100 mcg  100 mcg Oral Q0600 Agbata, Tochukwu, MD      . LORazepam (ATIVAN) injection 1 mg  1 mg Intravenous Once Lang Snow, NP      . pantoprazole (PROTONIX) EC tablet 40 mg  40 mg Oral Daily Agbata, Tochukwu, MD   40 mg at 12/22/19 1218  . potassium chloride SA (KLOR-CON) CR tablet 10 mEq  10 mEq Oral Daily Agbata, Tochukwu, MD   10 mEq at 12/22/19 1218  . sertraline (ZOLOFT) tablet 50 mg  50 mg Oral Daily Agbata, Tochukwu, MD   50 mg at 12/22/19 1218    Musculoskeletal: Strength & Muscle Tone: within normal limits Gait & Station: Not assessed  Patient leans: N/A  Psychiatric Specialty Exam: Physical Exam Vitals and nursing note reviewed.  Constitutional:      Appearance: Normal appearance.  HENT:     Head: Normocephalic.     Nose: Nose normal.  Pulmonary:     Effort: Pulmonary effort is normal.  Musculoskeletal:        General: Normal range of motion.     Cervical back: Normal  range of motion.  Neurological:     General: No focal deficit present.     Mental Status: She is alert and oriented to person, place, and time.  Psychiatric:        Attention and Perception: Attention normal. She does not perceive auditory or visual hallucinations.        Mood and Affect: Mood and affect normal.        Speech: Speech normal.        Behavior: Behavior is cooperative.        Thought Content: Thought content normal. Thought content does not include homicidal or suicidal ideation.        Cognition and Memory: Memory is impaired.        Judgment: Judgment is impulsive.     Review of Systems  Psychiatric/Behavioral: Positive for confusion and sleep disturbance. Negative for hallucinations and suicidal ideas.  All other systems reviewed and are negative.   Blood pressure (!) 156/99, pulse 83, temperature 98 F (36.7 C), temperature source Oral, resp. rate 19, height 5' (1.524 m), weight 54.4 kg, SpO2 100 %.Body mass index is 23.44 kg/m.  General Appearance: Casual and Neat  Eye Contact:  Good  Speech:  Normal Rate  Volume:  Normal  Mood:  Irritable  Affect:  Non-Congruent  Thought Process:  Coherent  Orientation:  Other:  not able to state the time or place  Thought Content:  Illogical  Suicidal Thoughts:  No  Homicidal Thoughts:  No  Memory:  Immediate;   Poor Recent;   Poor Remote;   Poor  Judgement:  Impaired  Insight:  Lacking  Psychomotor Activity:  Restlessness  Concentration:  Concentration: Fair and Attention Span: Fair  Recall:  Poor  Fund of Knowledge:  Good  Language:  Good  Akathisia:  No  Handed:  Right  AIMS (if indicated):     Assets:  Housing Social Support  ADL's:  Intact  Cognition:  WNL  Sleep:  Poor     Treatment Plan Summary: Medication management   Dementia with Behavioral Disturbances  -Recommend Risperidal 0.5 mg Daily for agitation PRN oral -If not taking oral medication, Haldol 2 mg IV with Ativan 1 mg IV BID PRN  agitation -Continue Aricept 10 mg daily  Depression: -Discontinue Abilify 2 mg daily -Continue Zoloft 50 mg daily  Disposition: No evidence of imminent risk to self or others at present.    Waylan Boga, NP 12/22/2019 4:08 PM

## 2019-12-22 NOTE — Progress Notes (Signed)
Pt stayed in her room today.  Patient was moved to room 102 near the nursing station.  Patient seems less restless. This Probation officer checked with Dr. Maryland Pink if ok to d/c sitter order.  Per MD ok to d/c sitter order.

## 2019-12-22 NOTE — Progress Notes (Signed)
*  PRELIMINARY RESULTS* Echocardiogram 2D Echocardiogram has been performed.  Paige Baldwin 12/22/2019, 9:38 AM

## 2019-12-22 NOTE — Progress Notes (Signed)
Physical Therapy Evaluation Patient Details Name: Paige Baldwin MRN: [id number] DOB: 1945-12-24 Today's Date: 12/22/2019   History of Present Illness  Per MD note:Jood L Castronovo is a 74 y.o. female with medical history significant for dementia, hypothyroidism, hypertension, dyslipidemia and GERD who was brought into the emergency room by her son for evaluation of slurred speech, unsteady gait and right facial droop that lasted for about 3 hours and resolved.  Clinical Impression  Patient agrees to PT evaluation. Pt reports no pain. Pt lives alone with 4 step entry into her home. Pt ambulated without AD prior to this hospital admission. Pt has 3/5 strength BLE hip and knee and is MI for bed mobility, MI for transfers sit to stand with RW. Pt ambulates without AD and 100 feet with MI assist and no reports of pain. Pt has WNL static sitting balance and good static/dynamic standing balance. Pt will continue to benefit from skilled PT to improve balance.     Follow Up Recommendations Home health PT    Equipment Recommendations  None recommended by PT    Recommendations for Other Services       Precautions / Restrictions Precautions Precautions: None Restrictions Weight Bearing Restrictions: No      Mobility  Bed Mobility Overal bed mobility: Independent                  Transfers Overall transfer level: Independent Equipment used: None             General transfer comment: independent  Ambulation/Gait Ambulation/Gait assistance: Modified independent (Device/Increase time) Gait Distance (Feet): 300 Feet Assistive device: None Gait Pattern/deviations: WFL(Within Functional Limits) Gait velocity: decreased gait speed      Stairs            Wheelchair Mobility    Modified Rankin (Stroke Patients Only)       Balance Overall balance assessment: Mild deficits observed, not formally tested                               Standardized Balance  Assessment Standardized Balance Assessment :  (decreased SLS 2 sec, tandem stand 12 sec)           Pertinent Vitals/Pain Pain Assessment: No/denies pain    Home Living Family/patient expects to be discharged to:: Unsure Living Arrangements: Alone                    Prior Function Level of Independence: Independent               Hand Dominance   Dominant Hand: Right    Extremity/Trunk Assessment   Upper Extremity Assessment Upper Extremity Assessment: Overall WFL for tasks assessed    Lower Extremity Assessment Lower Extremity Assessment: Overall WFL for tasks assessed       Communication   Communication: No difficulties  Cognition Arousal/Alertness: Awake/alert Behavior During Therapy: WFL for tasks assessed/performed Overall Cognitive Status: History of cognitive impairments - at baseline                                        General Comments      Exercises     Assessment/Plan    PT Assessment Patient needs continued PT services  PT Problem List Decreased balance       PT Treatment Interventions Balance training;Neuromuscular re-education  PT Goals (Current goals can be found in the Care Plan section)  Acute Rehab PT Goals Patient Stated Goal: no goals stated PT Goal Formulation: Patient unable to participate in goal setting Time For Goal Achievement: 01/05/20 Potential to Achieve Goals: Fair    Frequency Min 2X/week   Barriers to discharge Decreased caregiver support      Co-evaluation               AM-PAC PT "6 Clicks" Mobility  Outcome Measure Help needed turning from your back to your side while in a flat bed without using bedrails?: None Help needed moving from lying on your back to sitting on the side of a flat bed without using bedrails?: None Help needed moving to and from a bed to a chair (including a wheelchair)?: None Help needed standing up from a chair using your arms (e.g., wheelchair or  bedside chair)?: None Help needed to walk in hospital room?: None Help needed climbing 3-5 steps with a railing? : A Little 6 Click Score: 23    End of Session Equipment Utilized During Treatment: Gait belt Activity Tolerance: Patient tolerated treatment well Patient left: with call bell/phone within reach;with family/visitor present Nurse Communication: Mobility status PT Visit Diagnosis: Unsteadiness on feet (R26.81)    Time: 2072-1828 PT Time Calculation (min) (ACUTE ONLY): 20 min   Charges:   PT Evaluation $PT Eval Low Complexity: 1 Low PT Treatments $Therapeutic Activity: 8-22 mins          Alanson Puls, PT DPT 12/22/2019, 10:37 AM

## 2019-12-22 NOTE — Evaluation (Signed)
Occupational Therapy Evaluation Patient Details Name: Paige Baldwin MRN: [id number] DOB: [birth year] Today's Date: 12/22/2019    History of Present Illness  Paige Baldwin is a 74 y.o. female with medical history significant for dementia, hypothyroidism, hypertension, dyslipidemia and GERD who was brought into the emergency room by her son for evaluation of slurred speech, unsteady gait and right facial droop that lasted for about 3 hours and resolved   Clinical Impression   Paige Baldwin was seen for OT evaluation this date. Prior to hospital admission, pt was Independent in mobility and ADLs including driving - per epic family is uncertain pt is managing medication appropriately. Pt lives alone, husband currently at [facility] Pt presents to acute OT demonstrating impaired IADL performance and functional cognition 2/2 decreased safety awareness, poor insight into deficits, and STM deficits, and decreased problem solving.   Pt is A&O x2, oriented to self and time only. Pt is Independent in dressing and mobility tasks - walked 5 laps around nursing station (~866ft) while appropriately answering safety scenarios. Pt states that her husband is at home to assist her however brother at bedside states family currently cannot check on her every day. Pt completed SLUMS cognitive screening assessment scoring 14/30 with noted impairments in memory, problem solving, and executive function which limit her ability to participate functionally in IADLs. Pt completed Pill Box Test with a failing score. The Pill Box test assesses a pt's ability to accurately follow common medication bottle instructions to fill a 1 week pill box. It assesses a pt's ability to plan, self-monitor, volition, and executive function. Pt demonstrated significant deficits in planning and self-monitoring. After reading medication label stating "take 1 tablet 2 times daily with breakfast and dinner" pt placed 2 pills in breakfast box and 2 pills in dinner  box for one day, unable to fill for entire week or recognize error with MIN cueing.   Pt would benefit from skilled OT to address noted impairments and functional limitations (see below for any additional details) in order to maximize safety and independence while minimizing falls risk and caregiver burden. Upon hospital discharge, recommend HHOT and SUPERVISION for IADL mgmt to maximize pt safety and return to functional independence during meaningful occupations of daily life.     Follow Up Recommendations  Home health OT;Supervision - Intermittent    Equipment Recommendations  Other (comment) (pill box )    Recommendations for Other Services       Precautions / Restrictions Precautions Precautions: None Precaution Comments: 1:1 sitter Restrictions Weight Bearing Restrictions: No      Mobility Bed Mobility Overal bed mobility: Independent       Transfers Overall transfer level: Independent Equipment used: None      General transfer comment: independent    Balance Overall balance assessment: Mild deficits observed, not formally tested           ADL either performed or assessed with clinical judgement   ADL Overall ADL's : Independent    General ADL Comments: Independent ADLs. MIN A for IADLs; MOD A for medication mgmt - assist for problem solving and STM.                  Pertinent Vitals/Pain Pain Assessment: No/denies pain     Hand Dominance Right   Extremity/Trunk Assessment Upper Extremity Assessment Upper Extremity Assessment: Overall WFL for tasks assessed   Lower Extremity Assessment Lower Extremity Assessment: Overall WFL for tasks assessed       Communication Communication Communication:  No difficulties   Cognition Arousal/Alertness: Awake/alert Behavior During Therapy: WFL for tasks assessed/performed Overall Cognitive Status: History of cognitive impairments - at baseline      General Comments: Pt oriented to self and time  only.  The SLUMS is a 30-point screening questionnaire that tests orientation, memory, attention, problem solving, and executive function. Pt scored a 14/30 indicating dementia. Pt had noted impairments in memory, problem solving, and executive function which limit her ability to participate functionally in medication management.    Pt completed Pill Box Test with a failing score. The Pill Box test assesses a pt's ability to accurately follow common medication bottle instructions to fill a 1 week pill box. It assesses a pt's ability to plan, self-monitor, volition, and executive function. Pt demonstrated deficits in planning and self-monitoring. After reading medication label stating "take 1 tablet 2 times daily with breakfast and dinner" pt placed 2 pills in breakfast box and 2 pills in dinner box for one day, unable to fill for entire week or recognize error with MIN cueing.          Exercises Exercises: Other exercises Other Exercises Other Exercises: Pt completed SLUMS cognitive screening and pill box test to assess medication mgmt  Other Exercises: Don B socks/shoes, sup<>sit, sit<>stand, sitting/standing balance/tolerance, ~5 laps around nursing station, hand writing   Shoulder Instructions      Home Living Family/patient expects to be discharged to:: Private residence Living Arrangements: Alone Available Help at Discharge: Family;Available PRN/intermittently Type of Home: House Home Access: Stairs to enter [employer] of Steps: 4         Prior Functioning/Environment Level of Independence: Independent        Comments: Pt has baseline dementia - family reports she still drives (won't give up keys) and manages own medication (family unsure if taking accurately)        OT Problem List: Decreased cognition      OT Treatment/Interventions: Therapeutic exercise;Self-care/ADL training;Energy conservation;Therapeutic activities;Cognitive  remediation/compensation;Patient/family education    OT Goals(Current goals can be found in the care plan section) Acute Rehab OT Goals Patient Stated Goal: To return home OT Goal Formulation: With patient Time For Goal Achievement: 01/05/20 Potential to Achieve Goals: Good ADL Goals Additional ADL Goal #1: Pt will require MIN VCs for medication management c caregiver Independent in assisting Additional ADL Goal #2: Pt will Independetly identify x3 falls prevention strategies for improved safety c IADLs  OT Frequency: Min 1X/week   Barriers to D/C: Decreased caregiver support             AM-PAC OT "6 Clicks" Daily Activity     Outcome Measure Help from another person eating meals?: None Help from another person taking care of personal grooming?: None Help from another person toileting, which includes using toliet, bedpan, or urinal?: None Help from another person bathing (including washing, rinsing, drying)?: A Little Help from another person to put on and taking off regular upper body clothing?: None Help from another person to put on and taking off regular lower body clothing?: None 6 Click Score: 23   End of Session Nurse Communication: Other (comment) (Pt in bed c call bell and alarm on - ok for no sitter)  Activity Tolerance: Patient tolerated treatment well Patient left: in bed;with call bell/phone within reach;with bed alarm set  OT Visit Diagnosis: Other abnormalities of gait and mobility (R26.89);Other symptoms and signs involving cognitive function  Time: 1120-1204 (5 mins non-billable ) OT Time Calculation (min): 44 min Charges:  OT General Charges $OT Visit: 1 Visit OT Evaluation $OT Eval Moderate Complexity: 1 Mod OT Treatments $Self Care/Home Management : 23-37 mins  Dessie Coma, M.S. OTR/L  12/22/19, 1:51 PM  ascom [phone]

## 2019-12-22 NOTE — Progress Notes (Signed)
[facility] PROGRESS NOTE   Paige Baldwin NIO:[id number] DOB: 12-05-1945 DOA: 12/21/2019  PCP: Paige Baldwin., MD  Brief History/Interval Summary: 74y.o. female with medical history significant for dementia, hypothyroidism, hypertension, dyslipidemia and GERD who was brought into the emergency room by her son for evaluation of slurred speech, unsteady gait and right facial droop that lasted for about 3 hours and resolved. Patient lives alone and was scheduled for an EEG this morning for evaluation of episodes of unresponsiveness.  Her daughter had called her to remind her of her appointment and noted that her speech was slurred while they were talking.  This happened at about 8:30 AM on the day of admission.    Patient was hospitalized for further neurological work-up.  Reason for Visit: TIA  Consultants: Neurology  Procedures: EEG did not show any epileptiform activity  Antibiotics: Anti-infectives (From admission, onward)   None      Subjective/Interval History: Patient noted to be confused.  Apparently she has been monitoring her holidays.  She wants to go home.  Her brother is at the bedside.     Assessment/Plan:  TIA Patient seen by neurology.  MRI brain, MRA head, 2D echocardiogram and carotid Doppler has been recommended.  Patient currently on aspirin.  Does not seem to have any focal neurological deficits currently.  LDL is 137.  Patient will be placed on atorvastatin.  HbA1c is pending.  Seen by physical therapy who recommends home health.  History of dementia Continue donepezil.  Patient noted to be wandering in the hallways in the hospital.  She is noted to be confused.  Apparently lives by herself.  Will need to discuss with her family regarding further plans.  PT is recommending only home health.  Case manager social worker is already following.  Hypothyroidism Continue Synthroid  History of depression Noted to be on sertraline and Abilify.  Will  request psychiatry to see her to see if any pharmaceutical changes can be made.  DVT Prophylaxis: Currently on SCDs.  She has been ambulating in the hallway. Code Status: Full code Family Communication: We will discuss with family Disposition Plan: Unclear for now  Status is: Observation  The patient will require care spanning > 2 midnights and should be moved to inpatient because: Unsafe d/c plan  Dispo: The patient is from: Home              Anticipated d/c is to: To be determined              Anticipated d/c date is: 1 day              Patient currently is not medically stable to d/c.      Medications:  Scheduled: . ARIPiprazole  2 mg Oral Daily  . aspirin  300 mg Rectal Daily   Or  . aspirin  325 mg Oral Daily  . donepezil  10 mg Oral QHS  . levothyroxine  100 mcg Oral Q0600  . LORazepam  1 mg Intravenous Once  . pantoprazole  40 mg Oral Daily  . potassium chloride  10 mEq Oral Daily  . sertraline  50 mg Oral Daily   Continuous: . sodium chloride Stopped (12/21/19 1840)   GHW:EXHBZJIRCVELF **OR** acetaminophen (TYLENOL) oral liquid 160 mg/5 mL **OR** acetaminophen   Objective:  Vital Signs  Vitals:   12/21/19 1745 12/21/19 2020 12/21/19 2330 12/22/19 0917  BP: (!) 152/88 (!) 171/79 (!) 149/59 (!) 141/69  Pulse: 76 70 66  73  Resp: 18 20 18 19   Temp:  (!) 97.5 F (36.4 C) 97.7 F (36.5 C) 98.6 F (37 C)  TempSrc:  Oral Oral Oral  SpO2: 96% 100% 100% 100%  Weight:      Height:        Intake/Output Summary (Last 24 hours) at 12/22/2019 1139 Last data filed at 12/22/2019 0600 Gross per 24 hour  Intake 15.7 ml  Output --  Net 15.7 ml   Filed Weights   12/21/19 1127  Weight: 54.4 kg    General appearance: Awake alert.  In no distress.  Distracted Resp: Clear to auscultation bilaterally.  Normal effort Cardio: S1-S2 is normal regular.  No S3-S4.  No rubs murmurs or bruit GI: Abdomen is soft.  Nontender nondistended.  Bowel sounds are present  normal.  No masses organomegaly Extremities: No edema.  Full range of motion of lower extremities. Neurologic: Patient noted to be disoriented.  She did not know where she was.  She did not know the year.  No facial asymmetry.  Tongue is midline.  Motor strength is equal bilateral upper and lower extremities.  No pronator drift.   Lab Results:  Data Reviewed: I have personally reviewed following labs and imaging studies  CBC: Recent Labs  Lab 12/21/19 1134  WBC 5.9  NEUTROABS 2.7  HGB 13.9  HCT 43.1  MCV 89.4  PLT 660    Basic Metabolic Panel: Recent Labs  Lab 12/21/19 1134  NA 138  K 4.1  CL 104  CO2 25  GLUCOSE 116*  BUN 13  CREATININE 0.75  CALCIUM 9.1    GFR: Estimated Creatinine Clearance: 43.6 mL/min (by C-G formula based on SCr of 0.75 mg/dL).  Liver Function Tests: Recent Labs  Lab 12/21/19 1134  AST 19  ALT 19  ALKPHOS 85  BILITOT 0.7  PROT 7.7  ALBUMIN 4.5     Coagulation Profile: Recent Labs  Lab 12/21/19 1134  INR 0.9    Lipid Profile: Recent Labs    12/22/19 0416  CHOL 203*  HDL 51  LDLCALC 137*  TRIG 74  CHOLHDL 4.0     Recent Results (from the past 240 hour(s))  Respiratory Panel by RT PCR (Flu A&B, Covid) - Nasopharyngeal Swab     Status: None   Collection Time: 12/21/19  3:04 PM   Specimen: Nasopharyngeal Swab  Result Value Ref Range Status   SARS Coronavirus 2 by RT PCR NEGATIVE NEGATIVE Final    Comment: (NOTE) SARS-CoV-2 target nucleic acids are NOT DETECTED.  The SARS-CoV-2 RNA is generally detectable in upper respiratoy specimens during the acute phase of infection. The lowest concentration of SARS-CoV-2 viral copies this assay can detect is 131 copies/mL. A negative result does not preclude SARS-Cov-2 infection and should not be used as the sole basis for treatment or other patient management decisions. A negative result may occur with  improper specimen collection/handling, submission of specimen other than  nasopharyngeal swab, presence of viral mutation(s) within the areas targeted by this assay, and inadequate number of viral copies (<131 copies/mL). A negative result must be combined with clinical observations, patient history, and epidemiological information. The expected result is Negative.  Fact Sheet for Patients:  PinkCheek.be  Fact Sheet for Healthcare Providers:  GravelBags.it  This test is no t yet approved or cleared by the [place] FDA and  has been authorized for detection and/or diagnosis of SARS-CoV-2 by FDA under an Emergency Use Authorization (EUA). This EUA will remain  in effect (meaning this test can be used) for the duration of the COVID-19 declaration under Section 564(b)(1) of the Act, 21 U.S.C. section 360bbb-3(b)(1), unless the authorization is terminated or revoked sooner.     Influenza A by PCR NEGATIVE NEGATIVE Final   Influenza B by PCR NEGATIVE NEGATIVE Final    Comment: (NOTE) The Xpert Xpress SARS-CoV-2/FLU/RSV assay is intended as an aid in  the diagnosis of influenza from Nasopharyngeal swab specimens and  should not be used as a sole basis for treatment. Nasal washings and  aspirates are unacceptable for Xpert Xpress SARS-CoV-2/FLU/RSV  testing.  Fact Sheet for Patients: PinkCheek.be  Fact Sheet for Healthcare Providers: GravelBags.it  This test is not yet approved or cleared by the [place] FDA and  has been authorized for detection and/or diagnosis of SARS-CoV-2 by  FDA under an Emergency Use Authorization (EUA). This EUA will remain  in effect (meaning this test can be used) for the duration of the  Covid-19 declaration under Section 564(b)(1) of the Act, 21  U.S.C. section 360bbb-3(b)(1), unless the authorization is  terminated or revoked. Performed at [facility], [address], [place], [place]  [address]       Radiology Studies: EEG  Result Date: 12/21/2019 Lora Havens, MD     12/21/2019  5:08 PM Patient Name: Paige Baldwin MRN: [id number] Epilepsy Attending: Lora Havens Referring Physician/Provider: Dr Donnetta Simpers Date: 12/01/2019 Duration: 26.11 mins Patient history: 74 y.o. female with PMH significant for dementia, SAH, GERD, HLD, HTN, Hypothyroidism who presents with an episode of slurred speech, stumbling and R facial droop that lasted 3 hours. EEG to evaluate for seizure. Level of alertness: Awake AEDs during EEG study: None Technical aspects: This EEG study was done with scalp electrodes positioned according to the 10-20 International system of electrode placement. Electrical activity was acquired at a sampling rate of 500Hz  and reviewed with a high frequency filter of 70Hz  and a low frequency filter of 1Hz . EEG data were recorded continuously and digitally stored. Description: The posterior dominant rhythm consists of 8 Hz activity of moderate voltage (25-35 uV) seen predominantly in posterior head regions, symmetric and reactive to eye opening and eye closing. EEG showed continuous generalized 5 to 6 Hz theta slowing. Hyperventilation and photic stimulation were not performed.   ABNORMALITY -Continuous slow, generalized IMPRESSION: This study is suggestive of mild diffuse encephalopathy, nonspecific etiology. No seizures or epileptiform discharges were seen throughout the recording. Priyanka Barbra Sarks   CT HEAD WO CONTRAST  Result Date: 12/21/2019 CLINICAL DATA:  Neuro deficit, acute stroke suspected.  Aphasia EXAM: CT HEAD WITHOUT CONTRAST TECHNIQUE: Contiguous axial images were obtained from the base of the skull through the vertex without intravenous contrast. COMPARISON:  CT head 11/23/2019 FINDINGS: Brain: No evidence of acute infarction, hemorrhage, hydrocephalus, extra-axial collection or mass lesion/mass effect. Similar patchy white matter hypoattenuation, compatible  with chronic microvascular ischemic disease. Similar mild generalized cerebral volume loss with ex vacuo ventricular dilation. Vascular: Calcific atherosclerosis. Skull: No acute fracture. Sinuses/Orbits: No acute findings. Other: No mastoid effusions IMPRESSION: 1. No evidence of acute intracranial abnormality. 2. Similar generalized cerebral atrophy and chronic microvascular ischemic change. 3. Suspected chronic watershed territory cortical infarcts in the right were better appreciated on prior MRI. Electronically Signed   By: Margaretha Sheffield MD   On: 12/21/2019 12:00       LOS: 0 days   [facility] Pager on [email or web address]  12/22/2019, 11:39 AM

## 2019-12-22 NOTE — Progress Notes (Signed)
Patient is very confused, disoriented x3, wanders in the nursing station and wants to go home.  Patient needs to be redirected to her room frequently.  Sitter 1:1 ordered.  Dr. [place] Pink present in patient room and made aware.  No furthers orders at this time.

## 2019-12-23 DIAGNOSIS — F0281 Dementia in other diseases classified elsewhere with behavioral disturbance: Secondary | ICD-10-CM

## 2019-12-23 DIAGNOSIS — G308 Other Alzheimer's disease: Secondary | ICD-10-CM

## 2019-12-23 MED ORDER — SODIUM CHLORIDE 0.9 % IV SOLN: INTRAVENOUS | Status: DC

## 2019-12-23 MED ORDER — ATORVASTATIN CALCIUM 20 MG PO TABS
80.0000 mg | ORAL_TABLET | Freq: Every day | ORAL | Status: DC
  Administered 2019-12-23 – 2019-12-24 (×2): 80 mg via ORAL
  Filled 2019-12-23 (×2): qty 4

## 2019-12-23 MED ORDER — CLOPIDOGREL BISULFATE 75 MG PO TABS
75.0000 mg | ORAL_TABLET | Freq: Every day | ORAL | Status: DC
  Administered 2019-12-23 – 2019-12-24 (×2): 75 mg via ORAL
  Filled 2019-12-23 (×2): qty 1

## 2019-12-23 NOTE — Progress Notes (Signed)
Patient alert, ambulates in room without difficulty, MRI positive. No changes in neuro assessment this shift, will continue to monitor.

## 2019-12-23 NOTE — Progress Notes (Signed)
[facility] PROGRESS NOTE   Paige Baldwin JME:[id number] DOB: [birth year] DOA: 12/21/2019  PCP: Jerrol Banana., MD  Brief History/Interval Summary: 74y.o. female with medical history significant for dementia, hypothyroidism, hypertension, dyslipidemia and GERD who was brought into the emergency room by her son for evaluation of slurred speech, unsteady gait and right facial droop that lasted for about 3 hours and resolved. Patient lives alone and was scheduled for an EEG this morning for evaluation of episodes of unresponsiveness.  Her daughter had called her to remind her of her appointment and noted that her speech was slurred while they were talking.  This happened at about 8:30 AM on the day of admission.    Patient was hospitalized for further neurological work-up.  Reason for Visit: TIA  Consultants: Neurology  Procedures: EEG did not show any epileptiform activity  Antibiotics: Anti-infectives (From admission, onward)   None      Subjective/Interval History: Patient remains confused.  Wants to go home.  No complaints offered.       Assessment/Plan:  Acute right MCA stroke Patient was seen by neurology.  Stroke work-up was recommended.  Showed right MCA infarct.  MRA head did show evidence for stenosis in the intracranial vessels.  Cardiogram showed normal systolic function.  No other concerns identified.  Yet but looks like she had a carotid Doppler back in September which did not show any significant stenosis.  LDL was 137.  Patient started on atorvastatin.  Waiting on further neurology input.  Patient was started on Plavix overnight when the new stroke was detected.  Patient remains on aspirin.  Usually does not take aspirin on a consistent basis according to the patient's son.  Patient seen by PT and OT who recommended home health.   History of dementia Continue donepezil.  Patient has been wandering in the hallways of the hospital.  Long discussion with  son today who mentioned that her dementia has been progressively getting worse for the past 3 to 4 months.  She lives by herself.  Family tries to provide support as much as they can.  Patient wants to go home.  Case manager is following.  PT and OT recommend only home health.  Is a difficult situation.   Currently we are continuing donepezil and Lamictal.  History of depression Noted to be on sertraline and Abilify. Patient seen by psychiatry.  Abilify was discontinued.  Risperdal as needed added to the regimen.    Hypothyroidism Continue Synthroid  DVT Prophylaxis: Currently on SCDs.  She has been ambulating in the hallway. Code Status: Full code Family Communication: Discussed with son. Disposition Plan: Transition of care is on board.  However PT and OT has mentioned that she will need only home health.  May not be able to go to skilled nursing facility for rehab.  Status is: Inpatient  Remains inpatient appropriate because:Altered mental status, Ongoing diagnostic testing needed not appropriate for outpatient work up and Inpatient level of care appropriate due to severity of illness   Dispo:  Patient From: Home  Planned Disposition: To be determined  Expected discharge date: 12/23/19  Medically stable for discharge: No       Medications:  Scheduled: . aspirin  300 mg Rectal Daily   Or  . aspirin  325 mg Oral Daily  . atorvastatin  80 mg Oral Daily  . clopidogrel  75 mg Oral Daily  . donepezil  10 mg Oral QHS  . lamoTRIgine  50 mg Oral  Daily  . levothyroxine  100 mcg Oral Q0600  . pantoprazole  40 mg Oral Daily  . potassium chloride  10 mEq Oral Daily  . sertraline  50 mg Oral Daily   Continuous: . sodium chloride Stopped (12/22/19 0653)   ALP:[id number] **OR** acetaminophen (TYLENOL) oral liquid 160 mg/5 mL **OR** acetaminophen, risperiDONE **OR** [DISCONTINUED] haloperidol lactate   Objective:  Vital Signs  Vitals:   12/22/19 1256 12/22/19 1628 12/23/19  0024 12/23/19 0407  BP: (!) 156/99 (!) 151/75 (!) 149/65 (!) 132/58  Pulse: 83 89 92 81  Resp: 19 12 18 16   Temp: 98 F (36.7 C) 97.8 F (36.6 C) 97.7 F (36.5 C) 98.7 F (37.1 C)  TempSrc: Oral Oral Oral Oral  SpO2: 100% 100% 99% 99%  Weight:      Height:        Intake/Output Summary (Last 24 hours) at 12/23/2019 1018 Last data filed at 12/22/2019 1904 Gross per 24 hour  Intake 336.78 ml  Output --  Net 336.78 ml   Filed Weights   12/21/19 1127  Weight: 54.4 kg    General appearance: Awake alert.  In no distress.  Distracted  Resp: Clear to auscultation bilaterally.  Normal effort Cardio: S1-S2 is normal regular.  No S3-S4.  No rubs murmurs or bruit GI: Abdomen is soft.  Nontender nondistended.  Bowel sounds are present normal.  No masses organomegaly Extremities: No edema.  Full range of motion of lower extremities. Neurologic: Alert.  Confused and disoriented.  No focal deficits noted.    Lab Results:  Data Reviewed: I have personally reviewed following labs and imaging studies  CBC: Recent Labs  Lab 12/21/19 1134  WBC 5.9  NEUTROABS 2.7  HGB 13.9  HCT 43.1  MCV 89.4  PLT 992    Basic Metabolic Panel: Recent Labs  Lab 12/21/19 1134  NA 138  K 4.1  CL 104  CO2 25  GLUCOSE 116*  BUN 13  CREATININE 0.75  CALCIUM 9.1    GFR: Estimated Creatinine Clearance: 43.6 mL/min (by C-G formula based on SCr of 0.75 mg/dL).  Liver Function Tests: Recent Labs  Lab 12/21/19 1134  AST 19  ALT 19  ALKPHOS 85  BILITOT 0.7  PROT 7.7  ALBUMIN 4.5     Coagulation Profile: Recent Labs  Lab 12/21/19 1134  INR 0.9    Lipid Profile: Recent Labs    12/22/19 0416  CHOL 203*  HDL 51  LDLCALC 137*  TRIG 74  CHOLHDL 4.0     Recent Results (from the past 240 hour(s))  Respiratory Panel by RT PCR (Flu A&B, Covid) - Nasopharyngeal Swab     Status: None   Collection Time: 12/21/19  3:04 PM   Specimen: Nasopharyngeal Swab  Result Value Ref Range  Status   SARS Coronavirus 2 by RT PCR NEGATIVE NEGATIVE Final    Comment: (NOTE) SARS-CoV-2 target nucleic acids are NOT DETECTED.  The SARS-CoV-2 RNA is generally detectable in upper respiratoy specimens during the acute phase of infection. The lowest concentration of SARS-CoV-2 viral copies this assay can detect is 131 copies/mL. A negative result does not preclude SARS-Cov-2 infection and should not be used as the sole basis for treatment or other patient management decisions. A negative result may occur with  improper specimen collection/handling, submission of specimen other than nasopharyngeal swab, presence of viral mutation(s) within the areas targeted by this assay, and inadequate number of viral copies (<131 copies/mL). A negative result must be combined with  clinical observations, patient history, and epidemiological information. The expected result is Negative.  Fact Sheet for Patients:  PinkCheek.be  Fact Sheet for Healthcare Providers:  GravelBags.it  This test is no t yet approved or cleared by the [place] FDA and  has been authorized for detection and/or diagnosis of SARS-CoV-2 by FDA under an Emergency Use Authorization (EUA). This EUA will remain  in effect (meaning this test can be used) for the duration of the COVID-19 declaration under Section 564(b)(1) of the Act, 21 U.S.C. section 360bbb-3(b)(1), unless the authorization is terminated or revoked sooner.     Influenza A by PCR NEGATIVE NEGATIVE Final   Influenza B by PCR NEGATIVE NEGATIVE Final    Comment: (NOTE) The Xpert Xpress SARS-CoV-2/FLU/RSV assay is intended as an aid in  the diagnosis of influenza from Nasopharyngeal swab specimens and  should not be used as a sole basis for treatment. Nasal washings and  aspirates are unacceptable for Xpert Xpress SARS-CoV-2/FLU/RSV  testing.  Fact Sheet for  Patients: PinkCheek.be  Fact Sheet for Healthcare Providers: GravelBags.it  This test is not yet approved or cleared by the [place] FDA and  has been authorized for detection and/or diagnosis of SARS-CoV-2 by  FDA under an Emergency Use Authorization (EUA). This EUA will remain  in effect (meaning this test can be used) for the duration of the  Covid-19 declaration under Section 564(b)(1) of the Act, 21  U.S.C. section 360bbb-3(b)(1), unless the authorization is  terminated or revoked. Performed at [facility], [address], [place], [place] [address]       Radiology Studies: EEG  Result Date: 12/21/2019 Lora Havens, MD     12/21/2019  5:08 PM Patient Name: Paige Baldwin MRN: [id number] Epilepsy Attending: Lora Havens Referring Physician/Provider: Dr Donnetta Simpers Date: 12/01/2019 Duration: 26.11 mins Patient history: 74 y.o. female with PMH significant for dementia, SAH, GERD, HLD, HTN, Hypothyroidism who presents with an episode of slurred speech, stumbling and R facial droop that lasted 3 hours. EEG to evaluate for seizure. Level of alertness: Awake AEDs during EEG study: None Technical aspects: This EEG study was done with scalp electrodes positioned according to the 10-20 International system of electrode placement. Electrical activity was acquired at a sampling rate of 500Hz  and reviewed with a high frequency filter of 70Hz  and a low frequency filter of 1Hz . EEG data were recorded continuously and digitally stored. Description: The posterior dominant rhythm consists of 8 Hz activity of moderate voltage (25-35 uV) seen predominantly in posterior head regions, symmetric and reactive to eye opening and eye closing. EEG showed continuous generalized 5 to 6 Hz theta slowing. Hyperventilation and photic stimulation were not performed.   ABNORMALITY -Continuous slow, generalized IMPRESSION: This study is  suggestive of mild diffuse encephalopathy, nonspecific etiology. No seizures or epileptiform discharges were seen throughout the recording. Priyanka Barbra Sarks   CT HEAD WO CONTRAST  Result Date: 12/21/2019 CLINICAL DATA:  Neuro deficit, acute stroke suspected.  Aphasia EXAM: CT HEAD WITHOUT CONTRAST TECHNIQUE: Contiguous axial images were obtained from the base of the skull through the vertex without intravenous contrast. COMPARISON:  CT head 11/23/2019 FINDINGS: Brain: No evidence of acute infarction, hemorrhage, hydrocephalus, extra-axial collection or mass lesion/mass effect. Similar patchy white matter hypoattenuation, compatible with chronic microvascular ischemic disease. Similar mild generalized cerebral volume loss with ex vacuo ventricular dilation. Vascular: Calcific atherosclerosis. Skull: No acute fracture. Sinuses/Orbits: No acute findings. Other: No mastoid effusions IMPRESSION: 1. No evidence of acute  intracranial abnormality. 2. Similar generalized cerebral atrophy and chronic microvascular ischemic change. 3. Suspected chronic watershed territory cortical infarcts in the right were better appreciated on prior MRI. Electronically Signed   By: Margaretha Sheffield MD   On: 12/21/2019 12:00   MR ANGIO HEAD WO CONTRAST  Result Date: 12/22/2019 CLINICAL DATA:  Neuro deficit, stroke suspected EXAM: MRA HEAD WITHOUT CONTRAST TECHNIQUE: Angiographic images of the Circle of Willis were obtained using MRA technique without intravenous contrast. COMPARISON:  Concurrent MRI head FINDINGS: MRA HEAD FINDINGS Anterior circulation: Patent ACAs. Patent ICAs. Patent left MCA. High-grade narrowing of the mid right M1 segment with distal reconstitution. No aneurysm. Posterior circulation: Patent PICA. Patent V4 segments and basilar artery. Patent posterior cerebral arteries. Mild right P1 segment narrowing. Mild to moderate narrowing of the proximal to mid left superior cerebellar artery. No evidence of  thrombosis. Venous sinuses: As permitted by contrast timing, patent. Anatomic variants: The bilateral posterior communicating arteries are either diminutive or congenitally absent. IMPRESSION: High-grade narrowing of the mid right M1 segment with distal reconstitution. Mild to moderate proximal to mid left superior cerebellar artery narrowing. Mild right P1 segment narrowing. These results were called by telephone at the time of interpretation on 12/22/2019 at 10:17 pm to provider NP Rufina Falco , who verbally acknowledged these results. Electronically Signed   By: Primitivo Gauze M.D.   On: 12/22/2019 22:24   MR BRAIN WO CONTRAST  Addendum Date: 12/22/2019   ADDENDUM REPORT: 12/22/2019 22:22 ADDENDUM: These results were called by telephone at the time of interpretation on 12/22/2019 at 10:17 pm to provider NP Rufina Falco , who verbally acknowledged these results. Electronically Signed   By: Primitivo Gauze M.D.   On: 12/22/2019 22:22   Result Date: 12/22/2019 CLINICAL DATA:  Neuro deficit, stroke suspected. EXAM: MRI HEAD WITHOUT CONTRAST MRA HEAD WITHOUT CONTRAST TECHNIQUE: Multiplanar, multiecho pulse sequences of the brain and surrounding structures were obtained without intravenous contrast. Angiographic images of the head were obtained using MRA technique without contrast. COMPARISON:  11/02/2019 MRI head and prior. 12/21/2019 head CT and prior. FINDINGS: MRI HEAD FINDINGS Brain: Multifocal acute right MCA territory infarct. Superficial siderosis involving the bilateral cerebrum. Intrinsically T1 hyperintense cortically based right frontal and occipital signal likely reflects cortical laminar necrosis. Mild cerebral atrophy with ex vacuo dilatation. Sequela of prior right frontal insult. Moderate background chronic microvascular ischemic changes. Remote right centrum semiovale/corona radiata and right basal ganglia insults. No midline shift, mass lesion or ventriculomegaly. No extra-axial  fluid collection. Vascular: Please see MRA head. Skull and upper cervical spine: Normal marrow signal. Sinuses/Orbits: Normal orbits. Clear paranasal sinuses. No mastoid effusion. Other: None. MRA HEAD FINDINGS Anterior circulation: Patent ACAs. Patent ICAs. Patent left MCA. High-grade narrowing of the mid right M1 segment with distal reconstitution. No aneurysm. Posterior circulation: Patent PICA. Patent V4 segments and basilar artery. Patent posterior cerebral arteries. Mild right P1 segment narrowing. Mild to moderate narrowing of the proximal to mid left superior cerebellar artery. No evidence of thrombosis. Venous sinuses: As permitted by contrast timing, patent. Anatomic variants: The bilateral posterior communicating arteries are either diminutive or congenitally absent. IMPRESSION: MRI head: Multifocal acute right MCA territory infarcts. Bilateral cerebrum superficial siderosis. Sequela of prior right frontal, right centrum semiovale/corona radiata and right basal ganglia insults. Mild cerebral atrophy. Moderate chronic microvascular ischemic changes. MRA head: High-grade narrowing of the mid right M1 segment with distal reconstitution. Mild to moderate proximal to mid left superior cerebellar artery narrowing. Mild right P1 segment  narrowing. Electronically Signed: By: Primitivo Gauze M.D. On: 12/22/2019 22:15   ECHOCARDIOGRAM COMPLETE  Result Date: 12/22/2019    ECHOCARDIOGRAM REPORT   Patient Name:   Paige Baldwin [facility] Date of Exam: 12/22/2019 Medical Rec #:  [id number]     Height:       60.0 in Accession #:    [id number]    Weight:       120.0 lb Date of Birth:  [birth year]     BSA:          1.502 m Patient Age:    47 years      BP:           149/59 mmHg Patient Gender: F             HR:           75 bpm. Exam Location:  [facility] Procedure: 2D Echo Indications:     Stroke 434.91/ I163.9  History:         Patient has no prior history of Echocardiogram examinations.  Sonographer:     Arville Go RDCS  Referring Phys:  [id number] Diagnosing Phys: Yolonda Kida MD IMPRESSIONS  1. Left ventricular ejection fraction, by estimation, is 60 to 65%. The left ventricle has normal function. The left ventricle has no regional wall motion abnormalities. Left ventricular diastolic parameters were normal.  2. Right ventricular systolic function is normal. The right ventricular size is normal.  3. The mitral valve is grossly normal. No evidence of mitral valve regurgitation.  4. The aortic valve is normal in structure. Aortic valve regurgitation is not visualized. FINDINGS  Left Ventricle: Left ventricular ejection fraction, by estimation, is 60 to 65%. The left ventricle has normal function. The left ventricle has no regional wall motion abnormalities. The left ventricular internal cavity size was normal in size. There is  no left ventricular hypertrophy. Left ventricular diastolic parameters were normal. Right Ventricle: The right ventricular size is normal. No increase in right ventricular wall thickness. Right ventricular systolic function is normal. Left Atrium: Left atrial size was normal in size. Right Atrium: Right atrial size was normal in size. Pericardium: There is no evidence of pericardial effusion. Mitral Valve: The mitral valve is grossly normal. No evidence of mitral valve regurgitation. Tricuspid Valve: The tricuspid valve is normal in structure. Tricuspid valve regurgitation is not demonstrated. Aortic Valve: The aortic valve is normal in structure. Aortic valve regurgitation is not visualized. Aortic valve peak gradient measures 9.6 mmHg. Pulmonic Valve: The pulmonic valve was normal in structure. Pulmonic valve regurgitation is not visualized. Aorta: The ascending aorta was not well visualized. IAS/Shunts: No atrial level shunt detected by color flow Doppler.  LEFT VENTRICLE PLAX 2D LVIDd:         3.68 cm     Diastology LVIDs:         2.61 cm     LV e' medial:   4.79 cm/s LV PW:         0.88  cm     LV E/e' medial: 19.1 LV IVS:        0.98 cm LVOT diam:     1.80 cm LV SV:         33 LV SV Index:   22 LVOT Area:     2.54 cm  LV Volumes (MOD) LV vol d, MOD A2C: 48.6 ml LV vol d, MOD A4C: 52.5 ml LV vol s, MOD A2C: 17.1 ml LV vol s, MOD A4C:  16.3 ml LV SV MOD A2C:     31.5 ml LV SV MOD A4C:     52.5 ml LV SV MOD BP:      33.9 ml RIGHT VENTRICLE RV Basal diam:  1.87 cm TAPSE (M-mode): 2.4 cm LEFT ATRIUM             Index      RIGHT ATRIUM          Index LA diam:        2.50 cm 1.66 cm/m RA Area:     7.08 cm LA Vol (A2C):   14.2 ml 9.45 ml/m RA Volume:   12.30 ml 8.19 ml/m LA Vol (A4C):   12.4 ml 8.25 ml/m LA Biplane Vol: 13.8 ml 9.19 ml/m  AORTIC VALVE                PULMONIC VALVE AV Area (Vmax): 1.16 cm    PV Vmax:       0.80 m/s AV Vmax:        155.00 cm/s PV Peak grad:  2.6 mmHg AV Peak Grad:   9.6 mmHg LVOT Vmax:      70.50 cm/s LVOT Vmean:     47.300 cm/s LVOT VTI:       0.131 m  AORTA Ao Root diam: 2.60 cm Ao Asc diam:  2.90 cm MITRAL VALVE                TRICUSPID VALVE MV Area (PHT): 3.34 cm     TV Peak grad:   19.1 mmHg MV Decel Time: 227 msec     TV Vmax:        2.18 m/s MV E velocity: 91.30 cm/s MV A velocity: 105.00 cm/s  SHUNTS MV E/A ratio:  0.87         Systemic VTI:  0.13 m                             Systemic Diam: 1.80 cm Yolonda Kida MD Electronically signed by Yolonda Kida MD Signature Date/Time: 12/22/2019/3:28:39 PM    Final        LOS: 1 day   [facility] Pager on [email or web address]  12/23/2019, 10:18 AM

## 2019-12-23 NOTE — Progress Notes (Addendum)
    BRIEF OVERNIGHT PROGRESS REPORT   BRIEF PATIENT DESCRIPTION: 74 year old female with past medical history significant for mixed Alzheimer's and vascular dementia, hyperlipidemia, hypertension, hypothyroidism, GERD and depression admitted on 10/29 with strokelike symptoms now found to have acute right MCA infarct on MRI brain.  Patient was deemed not a candidate for IV alteplase secondary to resolved symptoms in the ED.  She was admitted to stroke unit for further evaluation and treatment.  SUBJECTIVE Radiology called to report abnormal MRI of the brain showing multifocal acute right MCA territory infarcts and high-grade narrowing of the mid right M1 segment with distal reconstitution.  Patient was evaluated at the bedside.  Overall she feels her condition is stable.  Neuro exam as below.  Otherwise no other acute focal neurologic deficit noted.  OBJECTIVE Most recent Vital Signs: Vitals:   12/22/19 0917 12/22/19 1256 12/22/19 1628 12/23/19 0024  BP: (!) 141/69 (!) 156/99 (!) 151/75 (!) 149/65  Pulse: 73 83 89 92  Resp: 19 19 12 18   Temp: 98.6 F (37 C) 98 F (36.7 C) 97.8 F (36.6 C) 97.7 F (36.5 C)  TempSrc: Oral Oral Oral Oral  SpO2: 100% 100% 100% 99%  Weight:      Height:       Focused Neuro Exam: Patient awake, oriented x 3. Follows commands. No eye opening apraxia. Extraocular movements intact. No evidence of  homonomyous hemianopia. Mild Left facial weakness noted. No Dysarthria. No aphasia. Bilateral upper and lower extremity with 5/5 strength. Moves right side without difficulty, strength normal. Sensation intact.  ASSESSMENT Ms. JENAVIVE LAMBOY is a 74 y.o. female presenting with slurred speech, facial asymmetry, and unsteady gait. . She did not receive IV t-PA due to rapidly improving symptoms. Imaging confirms a right MCA territory infarcts. Infarct likely secondary to occlusion of the M1 segment.  On No antithrombotic prior to admission. Now on aspirin 81 mg daily for  secondary stroke prevention. Patient with resultant mild left facial assymetry. Stroke work up underway  TREATMENT/PLAN  Acute right MCA territory infarcts -Continue aspirin 81 mg daily for secondary stroke prevention. -PT consult, OT consult, Speech consult -Echocardiogram shows no cardiac source of emboli. -Telemetry monitoring -Frequent neuro checks -Neurology following, input appreciated.  Discussed this new finding with on-call neurologist at [facility] Dr. Theda Sers who recommends addition of Plavix and neurology follow-up in the a.m.  Hypertension -Permissive hypertension per Neurology recommendations -Long term Goal BP <140/90  Hyperlipidemia -LDL goal < 70 -Atorvastatin 40mg  PO qhs  Mixed Dementia with behavioral disturbances -Continue donepezil -Continue lamotrigine for mood stabilization -Continue respiratory -We will discontinue Haldol and lorazepam due to paradoxical worsening of agitation in a patient with dementia and now with acute stroke on MRI.  Will avoid sedatives so has to avoid masking any new/acute neurological event. -Follows with outpatient neurology Dr. Jennings Books.    Rufina Falco, BSN, MSN, DNP, CCRN, FNP-C Software engineer

## 2019-12-23 NOTE — Progress Notes (Signed)
NEUROLOGY CONSULTATION PROGRESS NOTE   Date of service: December 23, 2019 Patient Name: Paige Baldwin MRN:  [id number] DOB:  1945/05/05  Brief HPI   Paige Baldwin is a 74 y.o. female with PMH significant for dementia(poor historian who lives by herself), SAH, GERD, HLD, HTN, Hypothyroidism who was noted by family to have an episode of slurred speech over the phone, appeared stumbling when family got there with R facial droop that lasted 3 hours. Neuro exam with executive dysfunction but no focal deficit noted.   Interval Hx   Workup with MRI Brain demonstrated acute multifocal R MCA infarcts, MR Angio head with multifocal multivessel stenosis including a high grade narrowing of the mid R M1 segment with distal reconstitution.  She feels that her left fingers are somewhat subjectively numb with no objective sensory deficit on evaluation.  Vitals   Vitals:   12/22/19 1256 12/22/19 1628 12/23/19 0024 12/23/19 0407  BP: (!) 156/99 (!) 151/75 (!) 149/65 (!) 132/58  Pulse: 83 89 92 81  Resp: 19 12 18 16   Temp: 98 F (36.7 C) 97.8 F (36.6 C) 97.7 F (36.5 C) 98.7 F (37.1 C)  TempSrc: Oral Oral Oral Oral  SpO2: 100% 100% 99% 99%  Weight:      Height:         Body mass index is 23.44 kg/m.  Physical Exam   General: Laying comfortably in bed; in no acute distress. HENT: Normal oropharynx and mucosa. Normal external appearance of ears and nose. Neck: Supple, no pain or tenderness CV: No JVD. No peripheral edema. Pulmonary: Symmetric Chest rise. Normal respiratory effort. Abdomen: Soft to touch, non-tender. Ext: No cyanosis, edema, or deformity Skin: No rash. Normal palpation of skin.  Musculoskeletal: Normal digits and nails by inspection. No clubbing.  Neurologic Examination  Mental status/Cognition: Alert, oriented to self, place, month and year, good attention. Speech/language: Fluent, comprehension intact, object naming intact, repetition intact. Cranial nerves:   CN II  Pupils equal and reactive to light, no VF deficits   CN III,IV,VI EOM intact, no gaze preference or deviation, no nystagmus   CN V normal sensation in V1, V2, and V3 segments bilateral   CN VII no asymmetry, no nasolabial fold flattening   CN VIII normal hearing to speech   CN IX & X normal palatal elevation, no uvular deviation   CN XI 5/5 head turn and 5/5 shoulder shrug bilaterally   CN XII midline tongue protrusion   Motor:  Muscle bulk: normal, tone normal, pronator drift none tremor none Mvmt Root Nerve  Muscle Right Left Comments  SA C5/6 Ax Deltoid 5 5   EF C5/6 Mc Biceps 5 5   EE C6/7/8 Rad Triceps 5 5   WF C6/7 Med FCR 5 5   WE C7/8 PIN ECU 5 5   F Ab C8/T1 U ADM/FDI 5 5   HF L1/2/3 Fem Illopsoas 5 5   KE L2/3/4 Fem Quad 5 5   DF L4/5 D Peron Tib Ant 5 5   PF S1/2 Tibial Grc/Sol 5 5    Sensation:  Light touch Intact throughout but some subjective left finger numbness   Pin prick    Temperature    Vibration   Proprioception    Coordination/Complex Motor:  - Finger to Nose intact BL - Heel to shin intact BL - Rapid alternating movement are normal - Gait: deferred.  Labs   Basic Metabolic Panel:  Lab Results  Component Value Date  NA 138 12/21/2019   K 4.1 12/21/2019   CO2 25 12/21/2019   GLUCOSE 116 (H) 12/21/2019   BUN 13 12/21/2019   CREATININE 0.75 12/21/2019   CALCIUM 9.1 12/21/2019   GFRNONAA >60 12/21/2019   GFRAA >60 11/23/2019   HbA1c:  Lab Results  Component Value Date   HGBA1C 5.8 (H) 03/03/2017   LDL:  Lab Results  Component Value Date   LDLCALC 137 (H) 12/22/2019   Urine Drug Screen: No results found for: LABOPIA, COCAINSCRNUR, LABBENZ, AMPHETMU, THCU, LABBARB  Alcohol Level No results found for: ETH No results found for: PHENYTOIN, ZONISAMIDE, LAMOTRIGINE, LEVETIRACETA No results found for: PHENYTOIN, PHENOBARB, VALPROATE, CBMZ  Imaging and Diagnostic studies   MRI Brain without contrast: Multifocal acute right MCA territory  infarcts.  Bilateral cerebrum superficial siderosis. Sequela of prior right frontal, right centrum semiovale/corona radiata and right basal ganglia insults.  Mild cerebral atrophy. Moderate chronic microvascular ischemic changes.  MR Angio head without contrast  High-grade narrowing of the mid right M1 segment with distal reconstitution.  Mild to moderate proximal to mid left superior cerebellar artery narrowing.  Mild right P1 segment narrowing.  UC Carotid doppler  Color duplex indicates minimal heterogeneous plaque, with no hemodynamically significant stenosis by duplex criteria in the extracranial cerebrovascular circulation.  Echo 1. Left ventricular ejection fraction, by estimation, is 60 to 65%. The  left ventricle has normal function. The left ventricle has no regional  wall motion abnormalities. Left ventricular diastolic parameters were  normal.  2. Right ventricular systolic function is normal. The right ventricular  size is normal.  3. The mitral valve is grossly normal. No evidence of mitral valve  regurgitation.  4. The aortic valve is normal in structure. Aortic valve regurgitation is  not visualized.   REEG: This study is suggestive of mild diffuse encephalopathy, nonspecific etiology. No seizures or epileptiform discharges were seen throughout the recording.  Impression   Paige Baldwin is a 74 y.o. female with PMH significant for dementia(poor historian who lives by herself), SAH, GERD, HLD, HTN, Hypothyroidism who was noted by family to have an episode of slurred speech over the phone, appeared stumbling when family got there with R facial droop that lasted 3 hours. Neuro exam with executive dysfunction but no focal deficit noted. Workup with R MCA multifocal infarcts with R M1 high grade stenosis on the MR Angio and multifocal multivessel stenosis.  Stroke likely due to high grade stenosis of the R M1 MCA. We will do permissive HTN for another 24  hours with strict bedrest and fluids with head of bed flat due to some fluctuating numbness in her left fingers.  rEEG is negative.  Recommendations  - Continue Aspirin 325mg  daily and added Plavix 75mg  daily for 21 days only. After 21 days, she should be on Aspirin 81mg  alone. - I ordered Atorvastatin 80mg  daily for elevated LDL to 137. - Strict bed rest for the next 24 hours, head of bed flat and fluids at 138ml/hr for the next 24 hours. - Recommend follow up with stroke clinic outpatient. - son who is at the bedside was updated. ______________________________________________________________________   Thank you for the opportunity to take part in the care of this patient. If you have any further questions, please contact the neurology consultation attending.  Signed,  [place] Pager Number [phone]

## 2019-12-24 DIAGNOSIS — I639 Cerebral infarction, unspecified: Secondary | ICD-10-CM

## 2019-12-24 LAB — CBC
HCT: 38.4 % (ref 36.0–46.0)
Hemoglobin: 12.6 g/dL (ref 12.0–15.0)
MCH: 28.9 pg (ref 26.0–34.0)
MCHC: 32.8 g/dL (ref 30.0–36.0)
MCV: 88.1 fL (ref 80.0–100.0)
Platelets: 191 10*3/uL (ref 150–400)
RBC: 4.36 MIL/uL (ref 3.87–5.11)
RDW: 12.8 % (ref 11.5–15.5)
WBC: 8.2 10*3/uL (ref 4.0–10.5)
nRBC: 0 % (ref 0.0–0.2)

## 2019-12-24 LAB — BASIC METABOLIC PANEL
Anion gap: 8 (ref 5–15)
BUN: 20 mg/dL (ref 8–23)
CO2: 28 mmol/L (ref 22–32)
Calcium: 8.9 mg/dL (ref 8.9–10.3)
Chloride: 103 mmol/L (ref 98–111)
Creatinine, Ser: 0.86 mg/dL (ref 0.44–1.00)
GFR, Estimated: >60 mL/min (ref >60)
Glucose, Bld: 106 mg/dL — ABNORMAL HIGH (ref 70–99)
Potassium: 4 mmol/L (ref 3.5–5.1)
Sodium: 139 mmol/L (ref 135–145)

## 2019-12-24 MED ORDER — ATORVASTATIN CALCIUM 80 MG PO TABS
80.0000 mg | ORAL_TABLET | Freq: Every day | ORAL | 1 refills | Status: DC
Start: 2019-12-24 — End: 2020-03-04

## 2019-12-24 MED ORDER — CLOPIDOGREL BISULFATE 75 MG PO TABS: 75.0000 mg | ORAL_TABLET | Freq: Every day | ORAL | 2 refills | Status: AC

## 2019-12-24 MED ORDER — ASPIRIN EC 325 MG PO TBEC: 325.0000 mg | DELAYED_RELEASE_TABLET | Freq: Every day | ORAL | 3 refills | Status: DC

## 2019-12-24 MED ORDER — RISPERIDONE 0.5 MG PO TABS
0.5000 mg | ORAL_TABLET | Freq: Every day | ORAL | 0 refills | Status: DC | PRN
Start: 2019-12-24 — End: 2022-04-20

## 2019-12-24 MED ORDER — TUBERCULIN PPD 5 UNIT/0.1ML ID SOLN
5.0000 [IU] | Freq: Once | INTRADERMAL | Status: DC
  Filled 2019-12-24: qty 0.1

## 2019-12-24 NOTE — Progress Notes (Signed)
Pt was agitated at beginning of shift, standing at room door and looking out the door window. Pt said she is ready to get out of here and called her son to come pick her up. Pt refused to get back in bed or sit down, despite RN and CNA's attempts.  Pt then started to try and walk the unit's hallway with her bags multiple times, saying she was looking for her son. After getting son on the phone to let her know that he could not come and she needed to stay, she became very upset. Ouma, NP came to bedside and helped calm down patient. Pt's daughter also came to help and stayed with her until midnight. Since then pt has been asleep and calm. Will continue to monitor.

## 2019-12-24 NOTE — Progress Notes (Signed)
NEUROLOGY CONSULTATION PROGRESS NOTE   Date of service: December 24, 2019 Patient Name: Paige Baldwin MRN:  [id number] DOB:  07/10/1945  Brief HPI   Paige Baldwin is a 74 y.o. female with PMH significant for dementia(poor historian who lives by herself), SAH, GERD, HLD, HTN, Hypothyroidism who was noted by family to have an episode of slurred speech over the phone, appeared stumbling when family got there with R facial droop that lasted 3 hours. Neuro exam with executive dysfunction but no focal deficit noted.   Interval Hx   Workup with MRI Brain demonstrated acute multifocal R MCA infarcts, MR Angio head with multifocal multivessel stenosis including a high grade narrowing of the mid R M1 segment with distal reconstitution. Yesterday, she felt that her left fingers are somewhat subjectively numb with no objective sensory deficit on evaluation. She has no complaints today other than the fact that she is not very sharp with answering questions, and that has been her problem for a while.  Vitals   Vitals:   12/23/19 0407 12/23/19 1204 12/23/19 1637 12/23/19 2110  BP: (!) 132/58 (!) 150/85 139/70 (!) 149/59  Pulse: 81 64 91 82  Resp: 16 18 18 17   Temp: 98.7 F (37.1 C) 97.6 F (36.4 C) 98.3 F (36.8 C) 98.1 F (36.7 C)  TempSrc: Oral Oral Oral Oral  SpO2: 99% 100% 99% 99%  Weight:      Height:         Body mass index is 23.44 kg/m.  Physical Exam   General: Sitting in chair, in no acute distress. HENT: Normal oropharynx and mucosa. Normal external appearance of ears and nose. Neck: Supple, no pain or tenderness CV: No JVD. No peripheral edema. Pulmonary: Symmetric Chest rise. Normal respiratory effort. Abdomen: Soft to touch, non-tender. Ext: No cyanosis, edema, or deformity Skin: No rash. Normal palpation of skin.  Musculoskeletal: Normal digits and nails by inspection. No clubbing.  Neurologic Examination  Mental status/Cognition: Alert, oriented to self, place, not to  date or timie, limited attention. Could not tell me the name of the hospital - did say [place], but not [facility]  Could not tell me the correct city. Named current Software engineer of [place] correctly Could not name prior Presidents, or the [place] President from [place], who was shot in [place] ([place]. Merrilyn Puma). Speech/language: Fluent, comprehension intact, object naming intact, repetition intact. Cranial nerves:   CN II Pupils equal and reactive to light, no VF deficits   CN III,IV,VI EOM intact, no gaze preference or deviation, no nystagmus   CN V normal sensation in V1, V2, and V3 segments bilateral   CN VII no asymmetry, no nasolabial fold flattening   CN VIII normal hearing to speech   CN IX & X normal palatal elevation, no uvular deviation   CN XI 5/5 head turn and 5/5 shoulder shrug bilaterally   CN XII midline tongue protrusion   Motor:  5/5 in all fours with no drift. Sensation: intact to LT Coordination/Complex Motor: Intact FNF b/l Gait testing deferred  Labs   Basic Metabolic Panel:  Lab Results  Component Value Date   NA 139 12/24/2019   K 4.0 12/24/2019   CO2 28 12/24/2019   GLUCOSE 106 (H) 12/24/2019   BUN 20 12/24/2019   CREATININE 0.86 12/24/2019   CALCIUM 8.9 12/24/2019   GFRNONAA >60 12/24/2019   GFRAA >60 11/23/2019   HbA1c:  Lab Results  Component Value Date   HGBA1C 5.8 (H) 03/03/2017  LDL:  Lab Results  Component Value Date   LDLCALC 137 (H) 12/22/2019    Imaging and Diagnostic studies   MRI Brain without contrast: Multifocal acute right MCA territory infarcts. Bilateral cerebrum superficial siderosis. Sequela of prior right frontal, right centrum semiovale/corona radiata and right basal ganglia insults. Mild cerebral atrophy. Moderate chronic microvascular ischemic changes.  MR Angio head without contrast High-grade narrowing of the mid right M1 segment with distal reconstitution. Mild to moderate proximal to mid left superior cerebellar  artery narrowing. Mild right P1 segment narrowing.  UC Carotid doppler Color duplex indicates minimal heterogeneous plaque, with no hemodynamically significant stenosis by duplex criteria in the extracranial cerebrovascular circulation.  Echo 1. Left ventricular ejection fraction, by estimation, is 60 to 65%. The  left ventricle has normal function. The left ventricle has no regional  wall motion abnormalities. Left ventricular diastolic parameters were  normal.  2. Right ventricular systolic function is normal. The right ventricular  size is normal.  3. The mitral valve is grossly normal. No evidence of mitral valve  regurgitation.  4. The aortic valve is normal in structure. Aortic valve regurgitation is  not visualized.   rEEG: This study is suggestive of mild diffuse encephalopathy, nonspecific etiology. No seizures or epileptiform discharges were seen throughout the recording.  Impression   Paige Baldwin is a 74 y.o. female with PMH significant for dementia(poor historian who lives by herself), SAH, GERD, HLD, HTN, Hypothyroidism who was noted by family to have an episode of slurred speech over the phone, appeared stumbling when family got there with R facial droop that lasted 3 hours. Neuro exam with executive dysfunction but no focal deficit noted. Workup with R MCA multifocal infarcts with R M1 high grade stenosis on the MR Angio and multifocal multivessel stenosis. rEEG is negative.  -Stroke likely due to high grade stenosis of the R M1 MCA. -Underlying dementia-mixed AD + vascular dementia - follows at [facility], Dr Manuella Ghazi   Recommendations  - Due to multifocal ICAD, continue Aspirin 325mg  daily and added Plavix 75mg  daily for 3 months and then aspirin alone. - Continue Atorvastatin 80mg  daily for elevated LDL to 137. - Recommend follow up with outpatient neurology in 6-8 weeks ______________________________________________________________________ D/W Dr Curly Rim Please call with questions.  -- Amie Portland, MD Neurologist [place] Pager: [phone]

## 2019-12-24 NOTE — TOC Transition Note (Signed)
Transition of Care [facility]) - CM/SW Discharge Note   Patient Details  Name: Paige Baldwin MRN: [id number] Date of Birth: Jan 31, 1946  Transition of Care [facility]) CM/SW Contact:  Paige Hutching, RN Phone Number: 12/24/2019, 11:10 AM   Clinical Narrative:    Patient is medically cleared for discharge home with home health services.  Floydene Flock with [facility] has accepted the referral for RN, PT, OT, SLP, aide, and SW.  [facility] will be able to see patient tomorrow.  Patient's brother will be coming to pick her up today.   RNCM spoke with patient's son Paige Baldwin Kitchen via phone about discharge plan.  Paige Baldwin reports that the family is working on getting the patient into Memory Care and are trying to work out the finances, they will probably have to pay out of pocket for a year and spin down for LTC Medicaid.  [facility] Care Management consult placed, Paige Baldwin reports that any help available would be appreciated.   Informed Paige Baldwin that he may also want to check in to [facility].     Final next level of care: [place] Barriers to Discharge: Barriers Resolved   Patient Goals and CMS Choice Patient states their goals for this hospitalization and ongoing recovery are:: Patient is ready to go home- family is a little overwhelmed but happy to have home health services CMS Medicare.gov Compare Post Acute Care list provided to:: Patient Represenative (must comment) Choice offered to / list presented to : Adult Children  Discharge Placement                       Discharge Plan and Services In-house Referral: Clinical Social Work Discharge Planning Services: NA Post Acute Care Choice: Skilled Nursing Facility          DME Arranged: N/A DME Agency: NA       HH Arranged: RN, PT, OT, Nurse's Aide, Speech Therapy, Social Work [employer] Agency: [place] ([place]) Date [place]: 12/24/19 Time [place]: 1108 Representative spoke with at [place]: [place] (SDOH) Interventions     Readmission Risk Interventions No flowsheet data found.

## 2019-12-24 NOTE — Discharge Summary (Signed)
[facility]  Physician Discharge Summary   Patient ID: MONSERRAT VIDAURRI MRN: [id number] DOB/AGE: 1945/09/10 74 y.o.  Admit date: 12/21/2019 Discharge date: 12/24/2019  PCP: Jerrol Banana., MD  DISCHARGE DIAGNOSES:  Acute right MCA territory stroke History of dementia History of depression Hypothyroidism  RECOMMENDATIONS FOR OUTPATIENT FOLLOW UP: 1. Patient to follow-up with her neurologist in 2 weeks    Home Health: PT OT RN.  Home health RN to read tuberculin skin test on November 3 Equipment/Devices: None  CODE STATUS: Full code  DISCHARGE CONDITION: fair  Diet recommendation: Heart healthy  INITIAL HISTORY: 74y.o.femalewith medical history significant fordementia, hypothyroidism, hypertension, dyslipidemia and GERD who was brought into the emergency room by her son for evaluation of slurred speech, unsteady gait and right facial droop that lasted for about 3 hours and resolved. Patient lives alone and was scheduled for an EEG this morning for evaluation of episodes of unresponsiveness. Her daughter had called her to remind her of her appointment and noted that her speech was slurredwhile they were talking. This happened at about 8:30 AM on the day of admission.   Patient was hospitalized for further neurological work-up.  Consultations:  Neurology  Procedures:  EEG did not show any epileptiform activity    HOSPITAL COURSE:   Acute right MCA stroke Patient was seen by neurology.  Stroke work-up was recommended.    MRI showed right MCA infarct.  MRA head did show evidence for stenosis in the intracranial vessels.  Echocardiogram showed normal systolic function.  No other concerns identified.  Sshe had a carotid Doppler back in September which did not show any significant stenosis.  LDL was 137.  Patient started on atorvastatin.    Patient was given IV fluids for 24 hours due to the significant intracranial stenosis.   Discussed with neurology  today who recommends aspirin and Plavix for 3 months followed by aspirin alone.  Home health has been ordered.  Son to look into assisted living facility placement.  History of dementia Based on her outpatient neurology notes that she was taken off of donepezil.  She was placed on Lamictal.  Patient has been noted to wandering the hallways of the hospital.  Long discussion held with patient's son.  He mentioned that her dementia has been progressively getting worse for the past 3 to 4 months.  She lives by herself.  Family tries to provide support as much as they can.    History of depression Noted to be on sertraline.  Abilify was recently discontinued by her neurologist.  Patient was seen by psychiatry.  Risperdal added by psychiatry.    Hypothyroidism Continue Synthroid  Overall patient remained stable.  She wants to go home.  Cleared by neurology for discharge.  Discussed with her son.  Okay for discharge home today.   PERTINENT LABS:  The results of significant diagnostics from this hospitalization (including imaging, microbiology, ancillary and laboratory) are listed below for reference.    Microbiology: Recent Results (from the past 240 hour(s))  Respiratory Panel by RT PCR (Flu A&B, Covid) - Nasopharyngeal Swab     Status: None   Collection Time: 12/21/19  3:04 PM   Specimen: Nasopharyngeal Swab  Result Value Ref Range Status   SARS Coronavirus 2 by RT PCR NEGATIVE NEGATIVE Final    Comment: (NOTE) SARS-CoV-2 target nucleic acids are NOT DETECTED.  The SARS-CoV-2 RNA is generally detectable in upper respiratoy specimens during the acute phase of infection. The lowest concentration of SARS-CoV-2  viral copies this assay can detect is 131 copies/mL. A negative result does not preclude SARS-Cov-2 infection and should not be used as the sole basis for treatment or other patient management decisions. A negative result may occur with  improper specimen collection/handling,  submission of specimen other than nasopharyngeal swab, presence of viral mutation(s) within the areas targeted by this assay, and inadequate number of viral copies (<131 copies/mL). A negative result must be combined with clinical observations, patient history, and epidemiological information. The expected result is Negative.  Fact Sheet for Patients:  PinkCheek.be  Fact Sheet for Healthcare Providers:  GravelBags.it  This test is no t yet approved or cleared by the [place] FDA and  has been authorized for detection and/or diagnosis of SARS-CoV-2 by FDA under an Emergency Use Authorization (EUA). This EUA will remain  in effect (meaning this test can be used) for the duration of the COVID-19 declaration under Section 564(b)(1) of the Act, 21 U.S.C. section 360bbb-3(b)(1), unless the authorization is terminated or revoked sooner.     Influenza A by PCR NEGATIVE NEGATIVE Final   Influenza B by PCR NEGATIVE NEGATIVE Final    Comment: (NOTE) The Xpert Xpress SARS-CoV-2/FLU/RSV assay is intended as an aid in  the diagnosis of influenza from Nasopharyngeal swab specimens and  should not be used as a sole basis for treatment. Nasal washings and  aspirates are unacceptable for Xpert Xpress SARS-CoV-2/FLU/RSV  testing.  Fact Sheet for Patients: PinkCheek.be  Fact Sheet for Healthcare Providers: GravelBags.it  This test is not yet approved or cleared by the [place] FDA and  has been authorized for detection and/or diagnosis of SARS-CoV-2 by  FDA under an Emergency Use Authorization (EUA). This EUA will remain  in effect (meaning this test can be used) for the duration of the  Covid-19 declaration under Section 564(b)(1) of the Act, 21  U.S.C. section 360bbb-3(b)(1), unless the authorization is  terminated or revoked. Performed at [facility], [address] [place], [place] [address]      Labs:   Basic Metabolic Panel: Recent Labs  Lab 12/21/19 1134 12/24/19 0810  NA 138 139  K 4.1 4.0  CL 104 103  CO2 25 28  GLUCOSE 116* 106*  BUN 13 20  CREATININE 0.75 0.86  CALCIUM 9.1 8.9   Liver Function Tests: Recent Labs  Lab 12/21/19 1134  AST 19  ALT 19  ALKPHOS 85  BILITOT 0.7  PROT 7.7  ALBUMIN 4.5   CBC: Recent Labs  Lab 12/21/19 1134 12/24/19 0810  WBC 5.9 8.2  NEUTROABS 2.7  --   HGB 13.9 12.6  HCT 43.1 38.4  MCV 89.4 88.1  PLT 209 191     IMAGING STUDIES EEG  Result Date: 12/21/2019 Lora Havens, MD     12/21/2019  5:08 PM Patient Name: JANNINE ABREU MRN: [id number] Epilepsy Attending: Lora Havens Referring Physician/Provider: Dr Donnetta Simpers Date: 12/01/2019 Duration: 26.11 mins Patient history: 74 y.o. female with PMH significant for dementia, SAH, GERD, HLD, HTN, Hypothyroidism who presents with an episode of slurred speech, stumbling and R facial droop that lasted 3 hours. EEG to evaluate for seizure. Level of alertness: Awake AEDs during EEG study: None Technical aspects: This EEG study was done with scalp electrodes positioned according to the 10-20 International system of electrode placement. Electrical activity was acquired at a sampling rate of 500Hz  and reviewed with a high frequency filter of 70Hz  and a low frequency filter of 1Hz .  EEG data were recorded continuously and digitally stored. Description: The posterior dominant rhythm consists of 8 Hz activity of moderate voltage (25-35 uV) seen predominantly in posterior head regions, symmetric and reactive to eye opening and eye closing. EEG showed continuous generalized 5 to 6 Hz theta slowing. Hyperventilation and photic stimulation were not performed.   ABNORMALITY -Continuous slow, generalized IMPRESSION: This study is suggestive of mild diffuse encephalopathy, nonspecific etiology. No seizures or epileptiform discharges were seen  throughout the recording. Priyanka Barbra Sarks   CT HEAD WO CONTRAST  Result Date: 12/21/2019 CLINICAL DATA:  Neuro deficit, acute stroke suspected.  Aphasia EXAM: CT HEAD WITHOUT CONTRAST TECHNIQUE: Contiguous axial images were obtained from the base of the skull through the vertex without intravenous contrast. COMPARISON:  CT head 11/23/2019 FINDINGS: Brain: No evidence of acute infarction, hemorrhage, hydrocephalus, extra-axial collection or mass lesion/mass effect. Similar patchy white matter hypoattenuation, compatible with chronic microvascular ischemic disease. Similar mild generalized cerebral volume loss with ex vacuo ventricular dilation. Vascular: Calcific atherosclerosis. Skull: No acute fracture. Sinuses/Orbits: No acute findings. Other: No mastoid effusions IMPRESSION: 1. No evidence of acute intracranial abnormality. 2. Similar generalized cerebral atrophy and chronic microvascular ischemic change. 3. Suspected chronic watershed territory cortical infarcts in the right were better appreciated on prior MRI. Electronically Signed   By: Margaretha Sheffield MD   On: 12/21/2019 12:00   MR ANGIO HEAD WO CONTRAST  Result Date: 12/22/2019 CLINICAL DATA:  Neuro deficit, stroke suspected EXAM: MRA HEAD WITHOUT CONTRAST TECHNIQUE: Angiographic images of the Circle of Willis were obtained using MRA technique without intravenous contrast. COMPARISON:  Concurrent MRI head FINDINGS: MRA HEAD FINDINGS Anterior circulation: Patent ACAs. Patent ICAs. Patent left MCA. High-grade narrowing of the mid right M1 segment with distal reconstitution. No aneurysm. Posterior circulation: Patent PICA. Patent V4 segments and basilar artery. Patent posterior cerebral arteries. Mild right P1 segment narrowing. Mild to moderate narrowing of the proximal to mid left superior cerebellar artery. No evidence of thrombosis. Venous sinuses: As permitted by contrast timing, patent. Anatomic variants: The bilateral posterior  communicating arteries are either diminutive or congenitally absent. IMPRESSION: High-grade narrowing of the mid right M1 segment with distal reconstitution. Mild to moderate proximal to mid left superior cerebellar artery narrowing. Mild right P1 segment narrowing. These results were called by telephone at the time of interpretation on 12/22/2019 at 10:17 pm to provider NP Rufina Falco , who verbally acknowledged these results. Electronically Signed   By: Primitivo Gauze M.D.   On: 12/22/2019 22:24   MR BRAIN WO CONTRAST  Addendum Date: 12/22/2019   ADDENDUM REPORT: 12/22/2019 22:22 ADDENDUM: These results were called by telephone at the time of interpretation on 12/22/2019 at 10:17 pm to provider NP Rufina Falco , who verbally acknowledged these results. Electronically Signed   By: Primitivo Gauze M.D.   On: 12/22/2019 22:22   Result Date: 12/22/2019 CLINICAL DATA:  Neuro deficit, stroke suspected. EXAM: MRI HEAD WITHOUT CONTRAST MRA HEAD WITHOUT CONTRAST TECHNIQUE: Multiplanar, multiecho pulse sequences of the brain and surrounding structures were obtained without intravenous contrast. Angiographic images of the head were obtained using MRA technique without contrast. COMPARISON:  11/02/2019 MRI head and prior. 12/21/2019 head CT and prior. FINDINGS: MRI HEAD FINDINGS Brain: Multifocal acute right MCA territory infarct. Superficial siderosis involving the bilateral cerebrum. Intrinsically T1 hyperintense cortically based right frontal and occipital signal likely reflects cortical laminar necrosis. Mild cerebral atrophy with ex vacuo dilatation. Sequela of prior right frontal insult. Moderate background chronic microvascular ischemic  changes. Remote right centrum semiovale/corona radiata and right basal ganglia insults. No midline shift, mass lesion or ventriculomegaly. No extra-axial fluid collection. Vascular: Please see MRA head. Skull and upper cervical spine: Normal marrow signal.  Sinuses/Orbits: Normal orbits. Clear paranasal sinuses. No mastoid effusion. Other: None. MRA HEAD FINDINGS Anterior circulation: Patent ACAs. Patent ICAs. Patent left MCA. High-grade narrowing of the mid right M1 segment with distal reconstitution. No aneurysm. Posterior circulation: Patent PICA. Patent V4 segments and basilar artery. Patent posterior cerebral arteries. Mild right P1 segment narrowing. Mild to moderate narrowing of the proximal to mid left superior cerebellar artery. No evidence of thrombosis. Venous sinuses: As permitted by contrast timing, patent. Anatomic variants: The bilateral posterior communicating arteries are either diminutive or congenitally absent. IMPRESSION: MRI head: Multifocal acute right MCA territory infarcts. Bilateral cerebrum superficial siderosis. Sequela of prior right frontal, right centrum semiovale/corona radiata and right basal ganglia insults. Mild cerebral atrophy. Moderate chronic microvascular ischemic changes. MRA head: High-grade narrowing of the mid right M1 segment with distal reconstitution. Mild to moderate proximal to mid left superior cerebellar artery narrowing. Mild right P1 segment narrowing. Electronically Signed: By: Primitivo Gauze M.D. On: 12/22/2019 22:15   ECHOCARDIOGRAM COMPLETE  Result Date: 12/22/2019    ECHOCARDIOGRAM REPORT   Patient Name:   XANDREA CLAREY [facility] Date of Exam: 12/22/2019 Medical Rec #:  [id number]     Height:       60.0 in Accession #:    [id number]    Weight:       120.0 lb Date of Birth:  1946/02/18     BSA:          1.502 m Patient Age:    32 years      BP:           149/59 mmHg Patient Gender: F             HR:           75 bpm. Exam Location:  [facility] Procedure: 2D Echo Indications:     Stroke 434.91/ I163.9  History:         Patient has no prior history of Echocardiogram examinations.  Sonographer:     Arville Go RDCS Referring Phys:  [id number] FUXNATFT AGBATA Diagnosing Phys: Yolonda Kida MD IMPRESSIONS  1. Left  ventricular ejection fraction, by estimation, is 60 to 65%. The left ventricle has normal function. The left ventricle has no regional wall motion abnormalities. Left ventricular diastolic parameters were normal.  2. Right ventricular systolic function is normal. The right ventricular size is normal.  3. The mitral valve is grossly normal. No evidence of mitral valve regurgitation.  4. The aortic valve is normal in structure. Aortic valve regurgitation is not visualized. FINDINGS  Left Ventricle: Left ventricular ejection fraction, by estimation, is 60 to 65%. The left ventricle has normal function. The left ventricle has no regional wall motion abnormalities. The left ventricular internal cavity size was normal in size. There is  no left ventricular hypertrophy. Left ventricular diastolic parameters were normal. Right Ventricle: The right ventricular size is normal. No increase in right ventricular wall thickness. Right ventricular systolic function is normal. Left Atrium: Left atrial size was normal in size. Right Atrium: Right atrial size was normal in size. Pericardium: There is no evidence of pericardial effusion. Mitral Valve: The mitral valve is grossly normal. No evidence of mitral valve regurgitation. Tricuspid Valve: The tricuspid valve is normal in structure. Tricuspid valve regurgitation is not demonstrated. Aortic  Valve: The aortic valve is normal in structure. Aortic valve regurgitation is not visualized. Aortic valve peak gradient measures 9.6 mmHg. Pulmonic Valve: The pulmonic valve was normal in structure. Pulmonic valve regurgitation is not visualized. Aorta: The ascending aorta was not well visualized. IAS/Shunts: No atrial level shunt detected by color flow Doppler.  LEFT VENTRICLE PLAX 2D LVIDd:         3.68 cm     Diastology LVIDs:         2.61 cm     LV e' medial:   4.79 cm/s LV PW:         0.88 cm     LV E/e' medial: 19.1 LV IVS:        0.98 cm LVOT diam:     1.80 cm LV SV:         33 LV SV  Index:   22 LVOT Area:     2.54 cm  LV Volumes (MOD) LV vol d, MOD A2C: 48.6 ml LV vol d, MOD A4C: 52.5 ml LV vol s, MOD A2C: 17.1 ml LV vol s, MOD A4C: 16.3 ml LV SV MOD A2C:     31.5 ml LV SV MOD A4C:     52.5 ml LV SV MOD BP:      33.9 ml RIGHT VENTRICLE RV Basal diam:  1.87 cm TAPSE (M-mode): 2.4 cm LEFT ATRIUM             Index      RIGHT ATRIUM          Index LA diam:        2.50 cm 1.66 cm/m RA Area:     7.08 cm LA Vol (A2C):   14.2 ml 9.45 ml/m RA Volume:   12.30 ml 8.19 ml/m LA Vol (A4C):   12.4 ml 8.25 ml/m LA Biplane Vol: 13.8 ml 9.19 ml/m  AORTIC VALVE                PULMONIC VALVE AV Area (Vmax): 1.16 cm    PV Vmax:       0.80 m/s AV Vmax:        155.00 cm/s PV Peak grad:  2.6 mmHg AV Peak Grad:   9.6 mmHg LVOT Vmax:      70.50 cm/s LVOT Vmean:     47.300 cm/s LVOT VTI:       0.131 m  AORTA Ao Root diam: 2.60 cm Ao Asc diam:  2.90 cm MITRAL VALVE                TRICUSPID VALVE MV Area (PHT): 3.34 cm     TV Peak grad:   19.1 mmHg MV Decel Time: 227 msec     TV Vmax:        2.18 m/s MV E velocity: 91.30 cm/s MV A velocity: 105.00 cm/s  SHUNTS MV E/A ratio:  0.87         Systemic VTI:  0.13 m                             Systemic Diam: 1.80 cm Yolonda Kida MD Electronically signed by Yolonda Kida MD Signature Date/Time: 12/22/2019/3:28:39 PM    Final     DISCHARGE EXAMINATION: Vitals:   12/23/19 0407 12/23/19 1204 12/23/19 1637 12/23/19 2110  BP: (!) 132/58 (!) 150/85 139/70 (!) 149/59  Pulse: 81 64 91 82  Resp: 16 18 18  17  Temp: 98.7 F (37.1 C) 97.6 F (36.4 C) 98.3 F (36.8 C) 98.1 F (36.7 C)  TempSrc: Oral Oral Oral Oral  SpO2: 99% 100% 99% 99%  Weight:      Height:       General appearance: Awake alert.  In no distress.  Distracted Resp: Clear to auscultation bilaterally.  Normal effort Cardio: S1-S2 is normal regular.  No S3-S4.  No rubs murmurs or bruit GI: Abdomen is soft.  Nontender nondistended.  Bowel sounds are present normal.  No masses  organomegaly Extremities: No edema.  Full range of motion of lower extremities. Neurologic:  No focal neurological deficits.    DISPOSITION: Home with son  Discharge Instructions    Call MD for:  difficulty breathing, headache or visual disturbances   Complete by: As directed    Call MD for:  extreme fatigue   Complete by: As directed    Call MD for:  persistant dizziness or light-headedness   Complete by: As directed    Call MD for:  persistant nausea and vomiting   Complete by: As directed    Call MD for:  severe uncontrolled pain   Complete by: As directed    Call MD for:  temperature >100.4   Complete by: As directed    Diet - low sodium heart healthy   Complete by: As directed    Discharge instructions   Complete by: As directed    Please take your medications as prescribed.  Follow-up with your neurologist, Dr. Manuella Ghazi, in 2 weeks.  You were cared for by a hospitalist during your hospital stay. If you have any questions about your discharge medications or the care you received while you were in the hospital after you are discharged, you can call the unit and asked to speak with the hospitalist on call if the hospitalist that took care of you is not available. Once you are discharged, your primary care physician will handle any further medical issues. Please note that NO REFILLS for any discharge medications will be authorized once you are discharged, as it is imperative that you return to your primary care physician (or establish a relationship with a primary care physician if you do not have one) for your aftercare needs so that they can reassess your need for medications and monitor your lab values. If you do not have a primary care physician, you can call [phone] for a physician referral.   Increase activity slowly   Complete by: As directed        Allergies as of 12/24/2019   No Known Allergies     Medication List    STOP taking these medications   NON FORMULARY   NON  FORMULARY     TAKE these medications   aspirin EC 325 MG tablet Take 1 tablet (325 mg total) by mouth daily.   atorvastatin 80 MG tablet Commonly known as: LIPITOR Take 1 tablet (80 mg total) by mouth daily.   clopidogrel 75 MG tablet Commonly known as: PLAVIX Take 1 tablet (75 mg total) by mouth daily.   lamoTRIgine 25 MG tablet Commonly known as: LAMICTAL Take 25-50 mg by mouth 2 (two) times daily. Take 25 mg (one tablet) by mouth once daily for 14 days, then increase to 50 mg (Two tablets) once daily for 14 days, then increase to 50 mg (two tablets) twice daily, thereafter.   levothyroxine 100 MCG tablet Commonly known as: SYNTHROID TAKE 1 TABLET BY MOUTH DAILY BEFORE BREAKFAST. What changed:  See the new instructions.   pantoprazole 40 MG tablet Commonly known as: PROTONIX Take 1 tablet (40 mg total) by mouth daily.   risperiDONE 0.5 MG tablet Commonly known as: RISPERDAL Take 1 tablet (0.5 mg total) by mouth daily as needed (agitation).   sertraline 50 MG tablet Commonly known as: ZOLOFT TAKE 1 TABLET BY MOUTH EVERY DAY         Follow-up Information    Vladimir Crofts, MD. Schedule an appointment as soon as possible for a visit in 2 weeks.   Specialty: Neurology Why: Patient to make own follow up appt. Office not picking up at this time Contact information: [place] [facility] [place] [address] [phone]               TOTAL DISCHARGE TIME: 91 minutes  [place]  [facility] Pager on [email or web address]  12/24/2019, 12:32 PM

## 2019-12-24 NOTE — Progress Notes (Signed)
Physical Therapy Treatment Patient Details Name: Paige Baldwin MRN: [id number] DOB: Jan 19, 1946 Today's Date: 12/24/2019    History of Present Illness  Paige Baldwin is a 74 y.o. female with medical history significant for dementia, hypothyroidism, hypertension, dyslipidemia and GERD who was brought into the emergency room by her son for evaluation of slurred speech, unsteady gait and right facial droop that lasted for about 3 hours and resolved    PT Comments    Pt able to navigate room, don shoes and brush hair with supervision.  She is able to walk in hallway for 15+ minutes with good steady gait and no LOB or safety concerns other than cognition.  Overall progressing well with mobility.   Follow Up Recommendations  Home health PT     Equipment Recommendations  None recommended by PT    Recommendations for Other Services       Precautions / Restrictions Precautions Precautions: None Restrictions Other Position/Activity Restrictions: elopement risk    Mobility  Bed Mobility Overal bed mobility: Independent                Transfers Overall transfer level: Independent                  Ambulation/Gait Ambulation/Gait assistance: Modified independent (Device/Increase time) Gait Distance (Feet): 1000 Feet Assistive device: None Gait Pattern/deviations: Step-through pattern;Decreased step length - right;Decreased step length - left Gait velocity: decreased gait speed   General Gait Details: steady and able to walk long distances without LOB or safety issues.   Stairs             Wheelchair Mobility    Modified Rankin (Stroke Patients Only)       Balance Overall balance assessment: Mild deficits observed, not formally tested                                          Cognition Arousal/Alertness: Awake/alert Behavior During Therapy: WFL for tasks assessed/performed Overall Cognitive Status: History of cognitive impairments -  at baseline                                        Exercises      General Comments        Pertinent Vitals/Pain Pain Assessment: No/denies pain    Home Living                      Prior Function            PT Goals (current goals can now be found in the care plan section) Progress towards PT goals: Progressing toward goals    Frequency    Min 2X/week      PT Plan      Co-evaluation              AM-PAC PT "6 Clicks" Mobility   Outcome Measure  Help needed turning from your back to your side while in a flat bed without using bedrails?: None Help needed moving from lying on your back to sitting on the side of a flat bed without using bedrails?: None Help needed moving to and from a bed to a chair (including a wheelchair)?: None Help needed standing up from a chair using your arms (e.g., wheelchair or bedside chair)?:  None Help needed to walk in hospital room?: None Help needed climbing 3-5 steps with a railing? : A Little 6 Click Score: 23    End of Session Equipment Utilized During Treatment: Gait belt Activity Tolerance: Patient tolerated treatment well Patient left: with call bell/phone within reach;with family/visitor present Nurse Communication: Mobility status PT Visit Diagnosis: Unsteadiness on feet (R26.81)     Time: 0221-7981 PT Time Calculation (min) (ACUTE ONLY): 23 min  Charges:  $Gait Training: 23-37 mins                    Chesley Noon, PTA 12/24/19, 10:17 AM

## 2019-12-24 NOTE — Progress Notes (Signed)
SLP Cancellation Note  Patient Details Name: DINORA HEMM MRN: [id number] DOB: 11/21/1945   Cancelled treatment:       Reason Eval/Treat Not Completed:  (chart reviewed; spoke w/ pt). Pt is packing her clothes and bags again standing at her bedside, also brushing her teeth. Per NSG note early this AM at ~2AM, "Pt said she is ready to get out of here and called her son to come pick her up. Pt refused to get back in bed or sit down, despite RN and CNA's attempts. Pt then started to try and walk the unit's hallway with her bags multiple times, saying she was looking for her son. After getting son on the phone to let her know that he could not come and she needed to stay, she became very upset.". Per Neurology/chart notes, pt has a Baseline of Dementia who lives by herself; family nearby per report. MRI demonstrated "acute multifocal R MCA infarcts, MR Angio head with multifocal multivessel stenosis including a high grade narrowing of the mid R M1 segment with distal reconstitution". Upon meeting w/ pt briefly, speech was fluent during social conversation; pt stated she felt her speech was "fine now". Noted basic comprehension appeared intact w/ some object naming re: her items on the bed. Encouraged pt to let [place] services f/u at home post d/c today for an assessment of Cognitive-Language skills -- address Safety awareness. Pt stated she is still Driving her car(?). Encouraged to [place] and pt that pt should not Drive until cleared by Neurology/MD; Son should do the Driving errands.   Pt would benefit from ST f/u for Cog-Lang. Assessment to address any Safety awareness and/or concerns post d/c as pt lives Alone per CM/NSG. Pt agreed. CM/NSG updated and will order ST services HH.      Orinda Kenner, MS, CCC-SLP Speech Language Pathologist Rehab Services [phone] [facility] 12/24/2019, 11:10 AM

## 2019-12-27 ENCOUNTER — Other Ambulatory Visit: Payer: Self-pay

## 2019-12-27 DIAGNOSIS — Z8673 Personal history of transient ischemic attack (TIA), and cerebral infarction without residual deficits: Secondary | ICD-10-CM | POA: Diagnosis not present

## 2019-12-27 DIAGNOSIS — M189 Osteoarthritis of first carpometacarpal joint, unspecified: Secondary | ICD-10-CM | POA: Diagnosis not present

## 2019-12-27 DIAGNOSIS — F329 Major depressive disorder, single episode, unspecified: Secondary | ICD-10-CM | POA: Diagnosis not present

## 2019-12-27 DIAGNOSIS — K219 Gastro-esophageal reflux disease without esophagitis: Secondary | ICD-10-CM | POA: Diagnosis not present

## 2019-12-27 DIAGNOSIS — E785 Hyperlipidemia, unspecified: Secondary | ICD-10-CM | POA: Diagnosis not present

## 2019-12-27 DIAGNOSIS — I10 Essential (primary) hypertension: Secondary | ICD-10-CM | POA: Diagnosis not present

## 2019-12-27 DIAGNOSIS — F0281 Dementia in other diseases classified elsewhere with behavioral disturbance: Secondary | ICD-10-CM | POA: Diagnosis not present

## 2019-12-27 DIAGNOSIS — F419 Anxiety disorder, unspecified: Secondary | ICD-10-CM | POA: Diagnosis not present

## 2019-12-27 DIAGNOSIS — G301 Alzheimer's disease with late onset: Secondary | ICD-10-CM | POA: Diagnosis not present

## 2019-12-27 DIAGNOSIS — E063 Autoimmune thyroiditis: Secondary | ICD-10-CM | POA: Diagnosis not present

## 2019-12-27 DIAGNOSIS — I639 Cerebral infarction, unspecified: Secondary | ICD-10-CM | POA: Diagnosis not present

## 2019-12-27 DIAGNOSIS — Z9181 History of falling: Secondary | ICD-10-CM | POA: Diagnosis not present

## 2019-12-27 DIAGNOSIS — E039 Hypothyroidism, unspecified: Secondary | ICD-10-CM | POA: Diagnosis not present

## 2019-12-27 DIAGNOSIS — Z7982 Long term (current) use of aspirin: Secondary | ICD-10-CM | POA: Diagnosis not present

## 2019-12-27 DIAGNOSIS — Z7902 Long term (current) use of antithrombotics/antiplatelets: Secondary | ICD-10-CM | POA: Diagnosis not present

## 2019-12-27 LAB — HEMOGLOBIN A1C
Hgb A1c MFr Bld: 5.5 % (ref 4.8–5.6)
Mean Plasma Glucose: 111.15 mg/dL

## 2019-12-27 NOTE — Patient Outreach (Signed)
[place] [facility]) Care Management  12/27/2019  Paige Baldwin 1945-08-26 [id number]   New referral: Source: [facility] office does transition of care. Reason for referral:  Need social worker for assistance with placement.  Placed call to patient and son Paige Baldwin answered.   Paige Baldwin states that his mother is at home and he and his sister are checking on her on a regular basis.  Reports dementia is worsening and now patient has had a stroke. Reports no deficits except headache. Home health PT, OT and RN services were ordered.  Son report his father is a Manter and mother is in her home.  Son reports they have taken the keys away so patient is not able to drive. Son reports he and sister are managing medications.   Son reports Paige Baldwin came out yesterday to assess patient for placement. Son request help from social worker about options for his mom.     Plan: Encouraged son to call MD office and make an appointment. Placed referral to [facility] SW to assist with placement and discuss options with son. Will close to nursing at this time.  Paige Rand, RN, BSN, CEN [facility] [employer] [phone]

## 2019-12-28 NOTE — Progress Notes (Signed)
I,Paige Baldwin,acting as a scribe for [name], MD.,have documented all relevant documentation on the behalf of [name], MD,as directed by  [name], MD while in the presence of [name], MD.   Established patient visit   Patient: Paige Baldwin   DOB: [birth year]   74 y.o. Female  MRN: [id number] Visit Date: 12/31/2019  Today's healthcare provider: [name], MD   Chief Complaint  Patient presents with  . Hospitalization Follow-up   Subjective    HPI 3 Follow up Hospitalization Patient had a TIA and is completely recovered.  She has completely recovered from TIA.  She is on aspirin and Plavix and the Plavix should be for 3 months.  Her dementia is slowly getting worse. Patient was admitted to [facility] on 12/21/2019 and discharged on 12/24/2019. She was treated for; Aphasia, Acute right MCA territory stroke.   Treatment for this included; see notes in chart. Telephone follow up was done on none She reports good compliance with treatment. She reports this condition is improved.  --------------------------------------------------------------------  Patient states she feels somewhat improved since hospital discharge.       Medications: Outpatient Medications Prior to Visit  Medication Sig  . aspirin EC 325 MG tablet Take 1 tablet (325 mg total) by mouth daily.  Marland Kitchen atorvastatin (LIPITOR) 80 MG tablet Take 1 tablet (80 mg total) by mouth daily.  . clopidogrel (PLAVIX) 75 MG tablet Take 1 tablet (75 mg total) by mouth daily.  Marland Kitchen lamoTRIgine (LAMICTAL) 25 MG tablet Take 25-50 mg by mouth 2 (two) times daily. Take 25 mg (one tablet) by mouth once daily for 14 days, then increase to 50 mg (Two tablets) once daily for 14 days, then increase to 50 mg (two tablets) twice daily, thereafter.  . levothyroxine (SYNTHROID) 100 MCG tablet TAKE 1 TABLET BY MOUTH DAILY BEFORE BREAKFAST. (Patient taking differently: Take 100 mcg by mouth daily before  breakfast. )  . pantoprazole (PROTONIX) 40 MG tablet Take 1 tablet (40 mg total) by mouth daily.  . risperiDONE (RISPERDAL) 0.5 MG tablet Take 1 tablet (0.5 mg total) by mouth daily as needed (agitation).  . sertraline (ZOLOFT) 50 MG tablet TAKE 1 TABLET BY MOUTH EVERY DAY (Patient taking differently: Take 50 mg by mouth daily. )   No facility-administered medications prior to visit.    Review of Systems  Constitutional: Negative for appetite change, chills, fatigue and fever.  Respiratory: Negative for chest tightness and shortness of breath.   Cardiovascular: Negative for chest pain and palpitations.  Gastrointestinal: Negative for abdominal pain, nausea and vomiting.  Neurological: Negative for dizziness and weakness.       Objective    BP (!) 142/85 (BP Location: Right Arm, Patient Position: Sitting, Cuff Size: Normal)   Pulse 74   Temp 98.9 F (37.2 C) (Oral)   Resp 16   Ht 5' (1.524 m)   Wt 124 lb (56.2 kg)   LMP  (LMP Unknown)   SpO2 100%   BMI 24.22 kg/m  BP Readings from Last 3 Encounters:  12/31/19 (!) 142/85  12/23/19 (!) 149/59  10/24/19 136/72   Wt Readings from Last 3 Encounters:  12/31/19 124 lb (56.2 kg)  12/21/19 120 lb (54.4 kg)  10/24/19 131 lb (59.4 kg)      Physical Exam Vitals reviewed.  Constitutional:      Appearance: Normal appearance. She is well-developed and normal weight.  HENT:     Head: Normocephalic and  atraumatic.     Right Ear: External ear normal.     Left Ear: Tympanic membrane and external ear normal.     Nose: Nose normal.     Mouth/Throat:     Pharynx: Oropharynx is clear.  Eyes:     General: No scleral icterus.    Conjunctiva/sclera: Conjunctivae normal.  Neck:     Thyroid: No thyromegaly.     Comments: No cervical adenopathy but she does have minimal tenderness over the left carotid sinus area Cardiovascular:     Rate and Rhythm: Normal rate and regular rhythm.     Heart sounds: Normal heart sounds.  Pulmonary:      Effort: Pulmonary effort is normal.     Breath sounds: Normal breath sounds.  Abdominal:     Palpations: Abdomen is soft.  Musculoskeletal:     Right lower leg: No edema.     Left lower leg: No edema.  Lymphadenopathy:     Cervical: No cervical adenopathy.  Skin:    General: Skin is warm and dry.  Neurological:     General: No focal deficit present.     Mental Status: She is alert.  Psychiatric:        Mood and Affect: Mood normal.        Behavior: Behavior normal.        Thought Content: Thought content normal.        Judgment: Judgment normal.       No results found for any visits on 12/31/19.  Assessment & Plan     1. TIA (transient ischemic attack) On aspirin Plavix.  Take Plavix for 3 months. Has neurology follow-up. 2. Late onset Alzheimer's disease without behavioral disturbance ([place]) Family looking for placement.  Dementia is becoming worse. FL 2 was filled out today for assisted living.  Mention of the family she may need a memory care unit.  3. Need for influenza vaccination  - Flu Vaccine QUAD High Dose(Fluad)  4. Screening for tuberculosis  - QuantiFERON-TB Gold Plus  5. Adult hypothyroidism  - TSH   No follow-ups on file.         Shandreka Dante Cranford Mon, MD  [facility] [phone] (phone) [phone] (fax)  [place]

## 2019-12-31 ENCOUNTER — Encounter: Payer: Self-pay | Admitting: Family Medicine

## 2019-12-31 ENCOUNTER — Other Ambulatory Visit: Payer: Self-pay | Admitting: *Deleted

## 2019-12-31 ENCOUNTER — Ambulatory Visit (INDEPENDENT_AMBULATORY_CARE_PROVIDER_SITE_OTHER): Payer: Medicare Other | Admitting: Family Medicine

## 2019-12-31 ENCOUNTER — Other Ambulatory Visit: Payer: Self-pay

## 2019-12-31 VITALS — BP 142/85 | HR 74 | Temp 98.9°F | Resp 16 | Ht 60.0 in | Wt 124.0 lb

## 2019-12-31 DIAGNOSIS — G301 Alzheimer's disease with late onset: Secondary | ICD-10-CM

## 2019-12-31 DIAGNOSIS — I6529 Occlusion and stenosis of unspecified carotid artery: Secondary | ICD-10-CM

## 2019-12-31 DIAGNOSIS — E039 Hypothyroidism, unspecified: Secondary | ICD-10-CM | POA: Diagnosis not present

## 2019-12-31 DIAGNOSIS — Z111 Encounter for screening for respiratory tuberculosis: Secondary | ICD-10-CM

## 2019-12-31 DIAGNOSIS — Z23 Encounter for immunization: Secondary | ICD-10-CM

## 2019-12-31 DIAGNOSIS — F0280 Dementia in other diseases classified elsewhere without behavioral disturbance: Secondary | ICD-10-CM

## 2019-12-31 DIAGNOSIS — G459 Transient cerebral ischemic attack, unspecified: Secondary | ICD-10-CM | POA: Diagnosis not present

## 2019-12-31 NOTE — Patient Outreach (Signed)
[place] [facility]) Care Management  12/31/2019  Paige Baldwin March 20, 1945 [id number]   CSW received high alert referral for pt to assist with placement.  CSW spoke with pt's son who confirmed pt's identity.  CSW then introduced self, role and reason for call.  Per son, pt has suffered several small strokes as well along with a dementia diagnosis.  Pt's husband of 69 years was hospitalized earlier in year and is currently at [facility].  Per son, "they cannot live together because their needs/illnesses exacerbate each other".  Pt's memory issues have worsened and thus the family is now pursuing ALF.  Per son, pt goes today to see PCP and they will inquire about the FL2 form which has been sent to PCP for completion (by [name]).  Per son, pt will also need a TB test done at visit.   They are hopeful to get FL2 completed and level of care (ALF -vs- memory care) determined.   Per son, they also have met with an Elder Training and development officer who is helping with the financial side of this; private pay -vs- Medicaid eligibility for pt.   CSW offered support and validated son's challenges with caregiving for both parents.  CSW advised son to call if needs, questions, concerns arise that need assistance or guidance.   CSW will plan to touch base again later in week for updates on above and plans for placement.   Eduard Clos, MSW, [place] Worker  [place] [phone]

## 2020-01-01 ENCOUNTER — Encounter: Payer: Self-pay | Admitting: *Deleted

## 2020-01-01 NOTE — Telephone Encounter (Signed)
Patient seen in office 12/31/2019. Form and letter completed for patient.

## 2020-01-02 LAB — QUANTIFERON-TB GOLD PLUS
QuantiFERON Mitogen Value: 4.7 IU/mL
QuantiFERON Nil Value: 0.05 IU/mL
QuantiFERON TB1 Ag Value: 0.05 IU/mL
QuantiFERON TB2 Ag Value: 0.01 IU/mL
QuantiFERON-TB Gold Plus: NEGATIVE

## 2020-01-02 LAB — TSH: TSH: 1.42 u[IU]/mL (ref 0.450–4.500)

## 2020-01-03 ENCOUNTER — Ambulatory Visit: Payer: Self-pay | Admitting: *Deleted

## 2020-01-04 ENCOUNTER — Telehealth: Payer: Self-pay

## 2020-01-04 ENCOUNTER — Other Ambulatory Visit: Payer: Self-pay | Admitting: *Deleted

## 2020-01-04 NOTE — Telephone Encounter (Signed)
Please review. Thanks!  

## 2020-01-04 NOTE — Patient Outreach (Signed)
[place] [facility]) Care Management  01/04/2020  HALY FEHER 1945/04/04 [id number]   CSW attempted to reach pt/family on 01/03/2020 for updates on FL2/placement process.  CSW left a HIPPA compliant voice message and will await call back or try again in 3-4 business days.  Eduard Clos, MSW, [place] Worker  [place] [phone]

## 2020-01-04 NOTE — Telephone Encounter (Signed)
giCopied from [place] [id number]. Topic: General - Other >> Jan 04, 2020 10:20 AM Oneta Rack wrote: Reason for CRM: Mr. Margaretmary Bayley (son) checking on  the status of addendum FL2 form. Also, please print 01/01/2020 letter and include with FL2 form. Please advise when ready for pick up.

## 2020-01-06 DIAGNOSIS — I10 Essential (primary) hypertension: Secondary | ICD-10-CM | POA: Diagnosis not present

## 2020-01-06 DIAGNOSIS — E785 Hyperlipidemia, unspecified: Secondary | ICD-10-CM | POA: Diagnosis not present

## 2020-01-06 DIAGNOSIS — F0281 Dementia in other diseases classified elsewhere with behavioral disturbance: Secondary | ICD-10-CM | POA: Diagnosis not present

## 2020-01-06 DIAGNOSIS — E039 Hypothyroidism, unspecified: Secondary | ICD-10-CM | POA: Diagnosis not present

## 2020-01-06 DIAGNOSIS — F419 Anxiety disorder, unspecified: Secondary | ICD-10-CM | POA: Diagnosis not present

## 2020-01-06 DIAGNOSIS — G301 Alzheimer's disease with late onset: Secondary | ICD-10-CM | POA: Diagnosis not present

## 2020-01-09 ENCOUNTER — Other Ambulatory Visit: Payer: Self-pay | Admitting: *Deleted

## 2020-01-09 NOTE — Patient Outreach (Signed)
[facility]) Care Management  01/09/2020  Paige Baldwin 07-18-1945 [id number]   CSW spoke with son who reports they are continuing to work on placement for pt at [facility]. Per son, they continue to get all the "boxes checked" on the FL2, etc.  Son states they are hopeful for a bed soon.   CSW offered validation and support to son in regards to the emotional component of pt's pending placement.  CSW offered to assist with plans as needed and provided contact info to both son and Paige Baldwin for follow up.  CSW offered to touch base with [facility] ALF staff.  CSW spoke with Colletta Maryland at [facility] who is hopeful to get all the paperwork finalized and admission soon.  Per Colletta Maryland, they have a short wait list.    CSW updated son via email and will follow up for progress/updates in the next 2 weeks.    Eduard Clos, MSW, [place] Worker  [place] [phone]

## 2020-01-21 ENCOUNTER — Other Ambulatory Visit: Payer: Self-pay | Admitting: *Deleted

## 2020-01-22 NOTE — Patient Outreach (Signed)
[facility] [facility]) Care Management  01/22/2020  ZERAH HILYER February 14, 1946 [id number]   CSW spoke with pt's son by phone onb 01/21/2020 who reports his mother awaits a room at the ALF.  "They say it's a short waiting list".  He plans to await room availability at ALF and move her in. Meanwhile, pt's son says things at home are ok; family assists with meals, meds, etc.  Per son, they would be interested in info on MOMS meals. CSW will email son info on [employer]OM[employer] meals services to consider.    CSW advised son of plans to sign off. CSW encouraged son to call if further needs arise.   CSW will close referral and advise PCP and [facility] team of above.    Eduard Clos, MSW, [place] Worker  [place] [phone]

## 2020-01-24 ENCOUNTER — Ambulatory Visit: Payer: Self-pay | Admitting: Family Medicine

## 2020-01-26 DIAGNOSIS — I639 Cerebral infarction, unspecified: Secondary | ICD-10-CM | POA: Diagnosis not present

## 2020-02-24 ENCOUNTER — Other Ambulatory Visit: Payer: Self-pay | Admitting: Adult Health

## 2020-02-25 ENCOUNTER — Other Ambulatory Visit: Payer: Self-pay | Admitting: Family Medicine

## 2020-02-27 DIAGNOSIS — G309 Alzheimer's disease, unspecified: Secondary | ICD-10-CM | POA: Diagnosis not present

## 2020-02-27 DIAGNOSIS — R569 Unspecified convulsions: Secondary | ICD-10-CM | POA: Diagnosis not present

## 2020-02-27 DIAGNOSIS — F0280 Dementia in other diseases classified elsewhere without behavioral disturbance: Secondary | ICD-10-CM | POA: Diagnosis not present

## 2020-02-27 DIAGNOSIS — F418 Other specified anxiety disorders: Secondary | ICD-10-CM | POA: Diagnosis not present

## 2020-02-27 DIAGNOSIS — Z8673 Personal history of transient ischemic attack (TIA), and cerebral infarction without residual deficits: Secondary | ICD-10-CM | POA: Diagnosis not present

## 2020-02-27 DIAGNOSIS — F0150 Vascular dementia without behavioral disturbance: Secondary | ICD-10-CM | POA: Diagnosis not present

## 2020-03-04 ENCOUNTER — Other Ambulatory Visit: Payer: Self-pay

## 2020-03-04 ENCOUNTER — Ambulatory Visit (INDEPENDENT_AMBULATORY_CARE_PROVIDER_SITE_OTHER): Payer: Medicare Other | Admitting: Family Medicine

## 2020-03-04 ENCOUNTER — Encounter: Payer: Self-pay | Admitting: Family Medicine

## 2020-03-04 VITALS — BP 153/60 | HR 67 | Temp 98.8°F | Wt 115.0 lb

## 2020-03-04 DIAGNOSIS — G301 Alzheimer's disease with late onset: Secondary | ICD-10-CM

## 2020-03-04 DIAGNOSIS — F325 Major depressive disorder, single episode, in full remission: Secondary | ICD-10-CM

## 2020-03-04 DIAGNOSIS — E039 Hypothyroidism, unspecified: Secondary | ICD-10-CM | POA: Diagnosis not present

## 2020-03-04 DIAGNOSIS — E7800 Pure hypercholesterolemia, unspecified: Secondary | ICD-10-CM | POA: Diagnosis not present

## 2020-03-04 DIAGNOSIS — Z9181 History of falling: Secondary | ICD-10-CM

## 2020-03-04 DIAGNOSIS — I10 Essential (primary) hypertension: Secondary | ICD-10-CM

## 2020-03-04 DIAGNOSIS — F0281 Dementia in other diseases classified elsewhere with behavioral disturbance: Secondary | ICD-10-CM

## 2020-03-04 DIAGNOSIS — F02818 Dementia in other diseases classified elsewhere, unspecified severity, with other behavioral disturbance: Secondary | ICD-10-CM

## 2020-03-04 MED ORDER — ATORVASTATIN CALCIUM 80 MG PO TABS
80.0000 mg | ORAL_TABLET | Freq: Every day | ORAL | 1 refills | Status: DC
Start: 2020-03-04 — End: 2022-04-20

## 2020-03-04 NOTE — Progress Notes (Signed)
Established patient visit   Patient: Paige Baldwin   DOB: May 03, 1945   75 y.o. Female  MRN: [id number] Visit Date: 03/04/2020  Today's healthcare provider: [name], MD   Chief Complaint  Patient presents with  . Fall   Subjective    Fall The accident occurred more than 1 week ago. She landed on hard floor. The pain is present in the buttocks, head and face. Associated symptoms include headaches. Pertinent negatives include no fever, hearing loss, hematuria, loss of consciousness, numbness or tingling. The treatment provided no relief.  The pain from the fall is improving and she is having no problems ambulating at present.  Patient is having progressive problems with cognitive ability.  She does have some behavioral issues and that she is contrary with family.  She is on Risperdal for agitation.  She has been evaluated by Dr. Manuella Ghazi for her cognitive decline. She has a has mixed Alzheimer's and vascular dementia.  Patient Active Problem List   Diagnosis Date Noted  . Agitation 12/22/2019  . Dementia with behavioral disturbance ([place]) 12/22/2019  . TIA (transient ischemic attack) 12/21/2019  . Hypertension   . Late onset Alzheimer's disease without behavioral disturbance ([place]) 08/31/2019  . Depression, major, single episode, complete remission ([place]) 08/31/2019  . Alzheimer's dementia ([place]) 03/24/2018  . Anxiety 07/12/2014  . Clinical depression 07/12/2014  . Acid reflux 07/12/2014  . Autoimmune lymphocytic chronic thyroiditis 07/12/2014  . HLD (hyperlipidemia) 07/12/2014  . Essential hypertension 07/12/2014  . Adult hypothyroidism 07/12/2014  . Mild major depression ([place]) 07/12/2014  . Arthritis, degenerative 07/12/2014  . Post menopausal syndrome 07/12/2014  . CMC arthritis, thumb, degenerative 06/25/2014   Past Medical History:  Diagnosis Date  . Dementia ([place])   . Depression   . GERD (gastroesophageal reflux disease)   . Hyperlipidemia   . Hypertension    . Hypothyroidism    Social History   Tobacco Use  . Smoking status: Never Smoker  . Smokeless tobacco: Never Used  Substance Use Topics  . Alcohol use: No  . Drug use: No   No Known Allergies   Medications: Outpatient Medications Prior to Visit  Medication Sig  . aspirin EC 325 MG tablet Take 1 tablet (325 mg total) by mouth daily.  Marland Kitchen atorvastatin (LIPITOR) 80 MG tablet Take 1 tablet (80 mg total) by mouth daily.  Marland Kitchen levothyroxine (SYNTHROID) 100 MCG tablet TAKE 1 TABLET BY MOUTH DAILY BEFORE BREAKFAST. (Patient taking differently: Take 100 mcg by mouth daily before breakfast.)  . metoprolol tartrate (LOPRESSOR) 50 MG tablet TAKE 1 TABLET BY MOUTH EVERY DAY  . risperiDONE (RISPERDAL) 0.5 MG tablet Take 1 tablet (0.5 mg total) by mouth daily as needed (agitation).  . sertraline (ZOLOFT) 50 MG tablet TAKE 1 TABLET BY MOUTH EVERY DAY  . clopidogrel (PLAVIX) 75 MG tablet Take 1 tablet (75 mg total) by mouth daily. (Patient not taking: Reported on 03/04/2020)  . lamoTRIgine (LAMICTAL) 25 MG tablet Take 25-50 mg by mouth 2 (two) times daily. Take 25 mg (one tablet) by mouth once daily for 14 days, then increase to 50 mg (Two tablets) once daily for 14 days, then increase to 50 mg (two tablets) twice daily, thereafter. (Patient not taking: Reported on 03/04/2020)  . pantoprazole (PROTONIX) 40 MG tablet Take 1 tablet (40 mg total) by mouth daily.   No facility-administered medications prior to visit.    Review of Systems  Constitutional: Positive for fatigue. Negative for activity change, appetite  change, chills, diaphoresis, fever and unexpected weight change.  Respiratory: Negative.   Cardiovascular: Negative.   Gastrointestinal: Negative.   Genitourinary: Negative for hematuria.  Neurological: Positive for headaches. Negative for dizziness, tingling, loss of consciousness, light-headedness and numbness.      Objective    BP (!) 153/60 (BP Location: Left Arm, Patient Position:  Sitting, Cuff Size: Large)   Pulse 67   Temp 98.8 F (37.1 C) (Oral)   Wt 115 lb (52.2 kg)   LMP  (LMP Unknown)   SpO2 100%   BMI 22.46 kg/m    Physical Exam Vitals reviewed.  Constitutional:      Appearance: Normal appearance. She is well-developed and normal weight.  HENT:     Head: Normocephalic.     Comments: She has very small goose egg on her right occiput.  Nontender.    Right Ear: External ear normal.     Left Ear: Tympanic membrane and external ear normal.     Nose: Nose normal.     Mouth/Throat:     Pharynx: Oropharynx is clear.  Eyes:     General: No scleral icterus.    Conjunctiva/sclera: Conjunctivae normal.  Neck:     Thyroid: No thyromegaly.  Cardiovascular:     Rate and Rhythm: Normal rate and regular rhythm.     Heart sounds: Normal heart sounds.  Pulmonary:     Effort: Pulmonary effort is normal.     Breath sounds: Normal breath sounds.  Abdominal:     Palpations: Abdomen is soft.  Musculoskeletal:     Right lower leg: No edema.     Left lower leg: No edema.     Comments: Normal exam.  She has some mild muscular tenderness from her recent fall.  Lymphadenopathy:     Cervical: No cervical adenopathy.  Skin:    General: Skin is warm and dry.  Neurological:     General: No focal deficit present.     Mental Status: She is alert.  Psychiatric:        Mood and Affect: Mood normal.        Behavior: Behavior normal.       No results found for any visits on 03/04/20.  Assessment & Plan     1. Late onset Alzheimer's dementia with behavioral disturbance [facility]) She has Alzheimer's with  vascular dementia also. Continue Risperdal for agitation from dementia.  Follow-up 4 months 2. Primary hypertension Controlled.  3. Pure hypercholesterolemia On atorvastatin  4. Depression, major, single episode, complete remission (HCC) Continue sertraline.  5. Adult hypothyroidism   6. History of recent fall No significant injuries from this fall at  all.   No follow-ups on file.         Pio Eatherly Cranford Mon, MD  [facility] [phone] (phone) [phone] (fax)  [place]

## 2020-03-06 ENCOUNTER — Telehealth: Payer: Self-pay | Admitting: Family Medicine

## 2020-03-06 NOTE — Telephone Encounter (Signed)
Copied from [place] [id number]. Topic: Medicare AWV >> Mar 06, 2020 11:35 AM Cher Nakai R wrote: Reason for CRM:   No answer unable to leave message for patient to call back and schedule Medicare Annual Wellness Visit (AWV) in office.   If not able to come in office, please offer to do virtually.   Last AWV  10/05/2018  Please schedule at anytime with [facility] Health Advisor.  If any questions, please contact me at [phone]

## 2020-03-21 ENCOUNTER — Other Ambulatory Visit: Payer: Self-pay | Admitting: Family Medicine

## 2020-03-21 NOTE — Telephone Encounter (Signed)
Requested medication (s) are due for refill today - yes  Requested medication (s) are on the active medication list -yes  Future visit scheduled -yes  Last refill: 12/24/19  Notes to clinic: Request RF of medication prescribed by outside provider. Sent for review of request  Requested Prescriptions  Pending Prescriptions Disp Refills   aspirin EC 325 MG tablet 30 tablet 3    Sig: Take 1 tablet (325 mg total) by mouth daily.      Analgesics:  NSAIDS - aspirin Passed - 03/21/2020 11:28 AM      Passed - Patient is not pregnant      Passed - Valid encounter within last 12 months    Recent Outpatient Visits           2 weeks ago Late onset Alzheimer's dementia with behavioral disturbance [facility])   [facility] Jerrol Banana., MD   2 months ago TIA (transient ischemic attack)   [facility] Jerrol Banana., MD   4 months ago TIA (transient ischemic attack)   [facility] Jerrol Banana., MD   7 months ago Essential hypertension   [facility] Jerrol Banana., MD   11 months ago Essential hypertension   [facility] Jerrol Banana., MD       Future Appointments             In 1 month Jerrol Banana., MD [facility], [place]   In 3 months Jerrol Banana., MD [facility], [facility]                 Requested Prescriptions  Pending Prescriptions Disp Refills   aspirin EC 325 MG tablet 30 tablet 3    Sig: Take 1 tablet (325 mg total) by mouth daily.      Analgesics:  NSAIDS - aspirin Passed - 03/21/2020 11:28 AM      Passed - Patient is not pregnant      Passed - Valid encounter within last 12 months    Recent Outpatient Visits           2 weeks ago Late onset Alzheimer's dementia with behavioral disturbance [facility])   [facility] Jerrol Banana., MD   2 months ago TIA (transient ischemic attack)   [facility] Jerrol Banana., MD   4 months ago TIA (transient ischemic attack)   [facility] Jerrol Banana., MD   7 months ago Essential hypertension   [facility] Jerrol Banana., MD   11 months ago Essential hypertension   [facility] Jerrol Banana., MD       Future Appointments             In 1 month Jerrol Banana., MD [facility], [place]   In 3 months Jerrol Banana., MD [facility], [facility]

## 2020-03-21 NOTE — Telephone Encounter (Signed)
Copied from [place] [id number]. Topic: Quick Communication - Rx Refill/Question >> Mar 21, 2020 11:18 AM Leward Quan A wrote: Medication: aspirin EC 325 MG tablet   Has the patient contacted their pharmacy? Yes.   (Agent: If no, request that the patient contact the pharmacy for the refill.) (Agent: If yes, when and what did the pharmacy advise?)  Preferred Pharmacy (with phone number or street name): [id number] [place], [place] - [address]  Phone:  [phone] Fax:  [phone]     Agent: Please be advised that RX refills may take up to 3 business days. We ask that you follow-up with your pharmacy.

## 2020-03-25 ENCOUNTER — Telehealth: Payer: Self-pay | Admitting: Family Medicine

## 2020-03-25 NOTE — Telephone Encounter (Signed)
Paige Baldwin, from [facility], calling and is requesting to know if PCP will attend to pt under hospice services. Please advise.      [phone]

## 2020-03-26 MED ORDER — ASPIRIN EC 325 MG PO TBEC
325.0000 mg | DELAYED_RELEASE_TABLET | Freq: Every day | ORAL | 3 refills | Status: DC
Start: 2020-03-26 — End: 2023-03-05

## 2020-03-26 NOTE — Telephone Encounter (Signed)
I will be attending 

## 2020-03-26 NOTE — Telephone Encounter (Signed)
Paige Baldwin was advised.

## 2020-04-18 ENCOUNTER — Telehealth: Payer: Self-pay

## 2020-04-18 NOTE — Telephone Encounter (Signed)
Copied from [place] [phone]. Topic: General - Inquiry >> Apr 18, 2020  2:14 PM Oneta Rack wrote:  [place] Human name: Lattie Haw  Relation to pt: [place]  Call back number: [phone]   Reason for call:  Checking on the status of addendum and FL2 form patient would like to move in on 04/21/2020 OR 2/29/2022 awaiting the fax back. Caller will refax form to [phone] please note when received. Caller faxed initial form on 04/11/2020, please note when received

## 2020-04-22 DIAGNOSIS — Z23 Encounter for immunization: Secondary | ICD-10-CM | POA: Diagnosis not present

## 2020-04-29 ENCOUNTER — Ambulatory Visit: Payer: Self-pay | Admitting: Family Medicine

## 2020-05-07 ENCOUNTER — Ambulatory Visit (INDEPENDENT_AMBULATORY_CARE_PROVIDER_SITE_OTHER): Payer: Medicare Other | Admitting: Family Medicine

## 2020-05-07 ENCOUNTER — Encounter: Payer: Self-pay | Admitting: Family Medicine

## 2020-05-07 ENCOUNTER — Other Ambulatory Visit: Payer: Self-pay

## 2020-05-07 VITALS — BP 150/72 | HR 60 | Temp 97.9°F | Resp 16 | Wt 118.0 lb

## 2020-05-07 DIAGNOSIS — D692 Other nonthrombocytopenic purpura: Secondary | ICD-10-CM | POA: Diagnosis not present

## 2020-05-07 DIAGNOSIS — G309 Alzheimer's disease, unspecified: Secondary | ICD-10-CM

## 2020-05-07 DIAGNOSIS — F0150 Vascular dementia without behavioral disturbance: Secondary | ICD-10-CM | POA: Diagnosis not present

## 2020-05-07 DIAGNOSIS — T148XXA Other injury of unspecified body region, initial encounter: Secondary | ICD-10-CM | POA: Diagnosis not present

## 2020-05-07 DIAGNOSIS — F0280 Dementia in other diseases classified elsewhere without behavioral disturbance: Secondary | ICD-10-CM | POA: Diagnosis not present

## 2020-05-07 DIAGNOSIS — I10 Essential (primary) hypertension: Secondary | ICD-10-CM

## 2020-05-07 NOTE — Patient Instructions (Signed)
Decrease Aspirin to 162mg  daily (1/2 tablet).

## 2020-05-07 NOTE — Progress Notes (Signed)
Established patient visit   Patient: Paige Baldwin   DOB: Mar 13, 1945   75 y.o. Female  MRN: [id number] Visit Date: 05/07/2020  Today's healthcare provider: [name], MD   Chief Complaint  Patient presents with  . Bleeding/Bruising   Subjective    HPI     Bruising  She feels that condition is Worse. She is not having side effects. Patient denies any injuries. She does take an aspirin daily. Denies any pain, but reports that it is tender in both wrists.  No bruising anywhere else.  No falls no trauma..  Dementia slowly progressive.      Medications: Outpatient Medications Prior to Visit  Medication Sig  . aspirin EC 325 MG tablet Take 1 tablet (325 mg total) by mouth daily.  Marland Kitchen atorvastatin (LIPITOR) 80 MG tablet Take 1 tablet (80 mg total) by mouth daily.  Marland Kitchen levothyroxine (SYNTHROID) 100 MCG tablet TAKE 1 TABLET BY MOUTH DAILY BEFORE BREAKFAST. (Patient taking differently: Take 100 mcg by mouth daily before breakfast.)  . metoprolol tartrate (LOPRESSOR) 50 MG tablet TAKE 1 TABLET BY MOUTH EVERY DAY  . pantoprazole (PROTONIX) 40 MG tablet Take 1 tablet (40 mg total) by mouth daily.  . risperiDONE (RISPERDAL) 0.5 MG tablet Take 1 tablet (0.5 mg total) by mouth daily as needed (agitation).  . sertraline (ZOLOFT) 50 MG tablet TAKE 1 TABLET BY MOUTH EVERY DAY  . lamoTRIgine (LAMICTAL) 25 MG tablet Take 25-50 mg by mouth 2 (two) times daily. Take 25 mg (one tablet) by mouth once daily for 14 days, then increase to 50 mg (Two tablets) once daily for 14 days, then increase to 50 mg (two tablets) twice daily, thereafter. (Patient not taking: Reported on 03/04/2020)   No facility-administered medications prior to visit.    Review of Systems      Objective    BP (!) 150/72   Pulse 60   Temp 97.9 F (36.6 C)   Resp 16   Wt 118 lb (53.5 kg)   LMP  (LMP Unknown)   BMI 23.05 kg/m  BP Readings from Last 3 Encounters:  05/07/20 (!) 150/72  03/04/20 (!) 153/60   12/31/19 (!) 142/85   Wt Readings from Last 3 Encounters:  05/07/20 118 lb (53.5 kg)  03/04/20 115 lb (52.2 kg)  12/31/19 124 lb (56.2 kg)       Physical Exam Vitals reviewed.  Constitutional:      Appearance: Normal appearance. She is well-developed and normal weight.  HENT:     Head: Normocephalic and atraumatic.     Right Ear: External ear normal.     Left Ear: External ear normal.     Nose: Nose normal.     Mouth/Throat:     Pharynx: Oropharynx is clear.  Eyes:     General: No scleral icterus.    Conjunctiva/sclera: Conjunctivae normal.  Neck:     Thyroid: No thyromegaly.  Cardiovascular:     Rate and Rhythm: Normal rate and regular rhythm.     Heart sounds: Normal heart sounds.  Pulmonary:     Effort: Pulmonary effort is normal.     Breath sounds: Normal breath sounds.  Abdominal:     Palpations: Abdomen is soft.  Musculoskeletal:     Right lower leg: No edema.     Left lower leg: No edema.  Lymphadenopathy:     Cervical: No cervical adenopathy.  Skin:    General: Skin is warm and dry.  Comments: Bruising and senile purpura of the hands and distal forearms..  Neurological:     General: No focal deficit present.     Mental Status: She is alert.  Psychiatric:        Mood and Affect: Mood normal.        Behavior: Behavior normal.       No results found for any visits on 05/07/20.  Assessment & Plan     1. Bruising Normal bruising and elderly lady.  Cut aspirin from 325 daily to 162 daily  2. Senile purpura ([place])   3. Essential hypertension   4. Mixed Alzheimer's and vascular dementia ([place]) Progressive.  Followed by neurology.   No follow-ups on file.      I, [name], MD, have reviewed all documentation for this visit. The documentation on 05/10/20 for the exam, diagnosis, procedures, and orders are all accurate and complete.    Zeva Leber Cranford Mon, MD  [facility] [phone] (phone) [phone]  (fax)  [place]

## 2020-05-18 ENCOUNTER — Other Ambulatory Visit: Payer: Self-pay | Admitting: Family Medicine

## 2020-05-18 NOTE — Telephone Encounter (Signed)
Approved per protocol.  Requested Prescriptions  Pending Prescriptions Disp Refills  . sertraline (ZOLOFT) 50 MG tablet [Pharmacy Med Name: SERTRALINE HCL 50 MG TABLET] 90 tablet 0    Sig: TAKE 1 TABLET BY MOUTH EVERY DAY     Psychiatry:  Antidepressants - SSRI Passed - 05/18/2020 12:51 AM      Passed - Completed PHQ-2 or PHQ-9 in the last 360 days      Passed - Valid encounter within last 6 months    Recent Outpatient Visits          1 week ago [place] Jerrol Banana., MD   2 months ago Late onset Alzheimer's dementia with behavioral disturbance [facility])   [facility] Jerrol Banana., MD   4 months ago TIA (transient ischemic attack)   [facility] Jerrol Banana., MD   6 months ago TIA (transient ischemic attack)   [facility] Jerrol Banana., MD   9 months ago Essential hypertension   [facility] Jerrol Banana., MD      Future Appointments            In 1 month Jerrol Banana., MD [facility], [place]

## 2020-06-03 ENCOUNTER — Ambulatory Visit: Payer: Medicare Other | Admitting: Family Medicine

## 2020-06-19 ENCOUNTER — Telehealth: Payer: Self-pay | Admitting: Family Medicine

## 2020-06-19 NOTE — Telephone Encounter (Signed)
Please review. Thanks!  

## 2020-06-19 NOTE — Telephone Encounter (Signed)
Start with Seroquel 25 mg in the evening.  We might have to go to twice a day on that.  Start withi n the evening.  Thanks.

## 2020-06-19 NOTE — Telephone Encounter (Signed)
Pharmacist, community at  [facility] is calling they have spoke with pt POA ben her son. Pt is anxious and combative for several weeks now. Pt does have dementia. Please advice and fax number [phone] if md would like to put patient on any med

## 2020-06-20 NOTE — Telephone Encounter (Signed)
Order was faxed to [facility] to amber's attention. Pt's son was also advised.

## 2020-07-03 ENCOUNTER — Telehealth: Payer: Self-pay | Admitting: Family Medicine

## 2020-07-03 MED ORDER — OLANZAPINE 5 MG PO TABS: 5.0000 mg | ORAL_TABLET | Freq: Every day | ORAL | 0 refills | Status: DC

## 2020-07-03 NOTE — Telephone Encounter (Signed)
Order sent to change Seroquel to Zyprexa 5 mg q d
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
## 2020-07-16 ENCOUNTER — Ambulatory Visit: Payer: Medicare Other | Admitting: Family Medicine

## 2020-08-06 ENCOUNTER — Other Ambulatory Visit: Payer: Self-pay

## 2020-08-06 ENCOUNTER — Ambulatory Visit (INDEPENDENT_AMBULATORY_CARE_PROVIDER_SITE_OTHER): Payer: Medicare Other | Admitting: Family Medicine

## 2020-08-06 DIAGNOSIS — E7800 Pure hypercholesterolemia, unspecified: Secondary | ICD-10-CM

## 2020-08-06 DIAGNOSIS — G301 Alzheimer's disease with late onset: Secondary | ICD-10-CM

## 2020-08-06 DIAGNOSIS — I10 Essential (primary) hypertension: Secondary | ICD-10-CM

## 2020-08-06 DIAGNOSIS — F0281 Dementia in other diseases classified elsewhere with behavioral disturbance: Secondary | ICD-10-CM | POA: Diagnosis not present

## 2020-08-06 DIAGNOSIS — E039 Hypothyroidism, unspecified: Secondary | ICD-10-CM

## 2020-08-06 DIAGNOSIS — F419 Anxiety disorder, unspecified: Secondary | ICD-10-CM | POA: Diagnosis not present

## 2020-08-06 DIAGNOSIS — F320 Major depressive disorder, single episode, mild: Secondary | ICD-10-CM | POA: Diagnosis not present

## 2020-08-06 DIAGNOSIS — R451 Restlessness and agitation: Secondary | ICD-10-CM

## 2020-08-06 NOTE — Progress Notes (Signed)
I,Paige Baldwin,acting as a scribe for Paige Durie, MD.,have documented all relevant documentation on the behalf of Paige Durie, MD,as directed by  Paige Durie, MD while in the presence of Paige Durie, MD.  Established patient visit   Patient: Paige Baldwin   DOB: 04-27-1945   75 y.o. Female  MRN: [id number] Visit Date: 08/06/2020  Today's healthcare provider: [name], MD   Chief Complaint  Patient presents with   Follow-up   Subjective    HPI  Patient comes in today for follow-up accompanied by daughter.  Patient is now in assisted living and doing fairly well.  Cognitive impairment last dementia continues but appears to be stable.  behavioral issues improved with low-dose Abilify. Hypertension, follow-up  BP Readings from Last 3 Encounters:  05/07/20 (!) 150/72  03/04/20 (!) 153/60  12/31/19 (!) 142/85   Wt Readings from Last 3 Encounters:  05/07/20 118 lb (53.5 kg)  03/04/20 115 lb (52.2 kg)  12/31/19 124 lb (56.2 kg)     She was last seen for hypertension 3 months ago.  BP at that visit was 150/72. Management since that visit includes no medication changes.  She reports good compliance with treatment. She is not having side effects. none She is following a Regular, Low Sodium diet. She is not exercising. She does not smoke.  Use of agents associated with hypertension: none.   Outside blood pressures are none.  Pertinent labs: Lab Results  Component Value Date   CHOL 203 (H) 12/22/2019   HDL 51 12/22/2019   LDLCALC 137 (H) 12/22/2019   TRIG 74 12/22/2019   CHOLHDL 4.0 12/22/2019   Lab Results  Component Value Date   NA 139 12/24/2019   K 4.0 12/24/2019   CREATININE 0.86 12/24/2019   GFRNONAA >60 12/24/2019   GFRAA >60 11/23/2019   GLUCOSE 106 (H) 12/24/2019     The 10-year ASCVD risk score Paige Bussing DC Jr., et al., 2013) is: 27.8%   Follow up for bruising  The patient was last seen for this 3 months  ago. Changes made at last visit include cut aspirin from 325mg  to 162mg  daily.  She reports good compliance with treatment. She feels that condition is Unchanged. She is not having side effects. none        Medications: Outpatient Medications Prior to Visit  Medication Sig   aspirin EC 325 MG tablet Take 1 tablet (325 mg total) by mouth daily.   atorvastatin (LIPITOR) 80 MG tablet Take 1 tablet (80 mg total) by mouth daily.   lamoTRIgine (LAMICTAL) 25 MG tablet Take 25-50 mg by mouth 2 (two) times daily. Take 25 mg (one tablet) by mouth once daily for 14 days, then increase to 50 mg (Two tablets) once daily for 14 days, then increase to 50 mg (two tablets) twice daily, thereafter. (Patient not taking: Reported on 03/04/2020)   levothyroxine (SYNTHROID) 100 MCG tablet TAKE 1 TABLET BY MOUTH DAILY BEFORE BREAKFAST. (Patient taking differently: Take 100 mcg by mouth daily before breakfast.)   metoprolol tartrate (LOPRESSOR) 50 MG tablet TAKE 1 TABLET BY MOUTH EVERY DAY   OLANZapine (ZYPREXA) 5 MG tablet Take 1 tablet (5 mg total) by mouth at bedtime.   pantoprazole (PROTONIX) 40 MG tablet Take 1 tablet (40 mg total) by mouth daily.   risperiDONE (RISPERDAL) 0.5 MG tablet Take 1 tablet (0.5 mg total) by mouth daily as needed (agitation).   sertraline (ZOLOFT) 50 MG tablet TAKE  1 TABLET BY MOUTH EVERY DAY   No facility-administered medications prior to visit.    Review of Systems      Objective    LMP  (LMP Unknown)  BP Readings from Last 3 Encounters:  05/07/20 (!) 150/72  03/04/20 (!) 153/60  12/31/19 (!) 142/85   Wt Readings from Last 3 Encounters:  05/07/20 118 lb (53.5 kg)  03/04/20 115 lb (52.2 kg)  12/31/19 124 lb (56.2 kg)       Physical Exam Vitals reviewed.  Constitutional:      Appearance: Normal appearance. She is well-developed and normal weight.  HENT:     Head: Normocephalic.     Right Ear: External ear normal.     Left Ear: Tympanic membrane and external  ear normal.     Nose: Nose normal.     Mouth/Throat:     Pharynx: Oropharynx is clear.  Eyes:     General: No scleral icterus.    Conjunctiva/sclera: Conjunctivae normal.  Neck:     Thyroid: No thyromegaly.  Cardiovascular:     Rate and Rhythm: Normal rate and regular rhythm.     Heart sounds: Normal heart sounds.  Pulmonary:     Effort: Pulmonary effort is normal.     Breath sounds: Normal breath sounds.  Abdominal:     Palpations: Abdomen is soft.  Musculoskeletal:     Right lower leg: No edema.     Left lower leg: No edema.  Lymphadenopathy:     Cervical: No cervical adenopathy.  Skin:    General: Skin is warm and dry.  Neurological:     General: No focal deficit present.     Mental Status: She is alert.  Psychiatric:        Mood and Affect: Mood normal.        Behavior: Behavior normal.      No results found for any visits on 08/06/20.  Assessment & Plan     1. Late onset Alzheimer's dementia with behavioral disturbance (Paige Baldwin) Some behavioral issues improved with Abilify low-dose.  Continue present medications.  2. Agitation Improved with Abilify  3. Essential hypertension Good control on metoprolol  4. Adult hypothyroidism On Synthroid  5. Anxiety On sertraline and Abilify  6. Mild major depression ([facility]) On sertraline with Abilify for behavioral issues  7. Pure hypercholesterolemia On atorvastatin maximum dose   Return in about 4 months (around 12/06/2020).      I, Paige Durie, MD, have reviewed all documentation for this visit. The documentation on 08/10/20 for the exam, diagnosis, procedures, and orders are all accurate and complete.    Antoine Fiallos Cranford Mon, MD  [facility] [phone] (phone) [phone] (fax)  [place]

## 2020-09-16 ENCOUNTER — Other Ambulatory Visit: Payer: Self-pay | Admitting: Family Medicine

## 2020-11-06 ENCOUNTER — Ambulatory Visit: Payer: Self-pay | Admitting: *Deleted

## 2020-11-06 NOTE — Telephone Encounter (Signed)
Mrs. Paige Baldwin advised that we do not have any appointment for tomorrow. I did offer to be seen at [facility] or [facility]. Will call her back tomorrow with possible appt with them.   Copied from [place] [id number]. Topic: Appointment Scheduling - Scheduling Inquiry for Clinic >> Nov 06, 2020  4:45 PM Yvette Rack wrote: Reason for CRM: Pt daughter [name] stated pt has a spot on her left shoulder that seem to be infected and she would like to know if pt could possibly be seen tomorrow. Cb# [phone]

## 2020-11-06 NOTE — Telephone Encounter (Signed)
Please advise seeing patient Monday morning?

## 2020-11-06 NOTE — Telephone Encounter (Signed)
Pt has a spot on her left shoulder that seem like its infected and has pus coming from it / pts son called to schedule appt / first opening next Wednesday / son was not with the pt / please advise   Pts husband has an appt with Dr. Rosanna Randy on Monday Morning and son wanted to know if she can be seen that day as well with his dad/ please call son back due to pt having dementia     Spoke with pt's son Paige Baldwin, pt at [facility], dementia. States pt had been refusing to shower, daughter went to facility and assisted pt with shower, noted sore on left shoulder. Reports size of dime, purplish, "Looks like pus filled blister, some areas scabbed,like had been draining pus." Unsure how or when occurred, unsure if pt febrile. States had nurse at facility look at area, stated looked like a boil. Son states unsure if nurse treated. Nurse reported "May need drained." Son states "Clothing there is always mixed up with other pt's and dirty, may be something with that."  Son questioning if he could bring pt in Monday with his father who has an appt then.States may be easier with husband "Not sure though." Called during lunch break. Assured son NT would route to practice for PCPs review and final disposition. Verbalizes understanding. CB# [phone]  Reason for Disposition . 1 or 2 small sores  Answer Assessment - Initial Assessment Questions 1. APPEARANCE of SORES: "What do the sores look like?"     "Maybe a boil" 2. NUMBER: "How many sores are there?"     1 3. SIZE: "How big is the largest sore?"     Dime size 4. LOCATION: "Where are the sores located?"     Left shoulder 5. ONSET: "When did the sores begin?"     Unsure 6. CAUSE: "What do you think is causing the sores?"     Unsure 7. OTHER SYMPTOMS: "Do you have any other symptoms?" (e.g., fever, new weakness)     Unsure  Protocols used: Sores-A-AH

## 2020-11-06 NOTE — Telephone Encounter (Signed)
Copied from [place] [phone]. Topic: Appointment Scheduling - Scheduling Inquiry for Clinic >> Nov 06, 2020  4:45 PM Yvette Rack wrote: Reason for CRM: Pt daughter [name] stated pt has a spot on her left shoulder that seem to be infected and she would like to know if pt could possibly be seen tomorrow. Cb# [phone]

## 2020-11-07 ENCOUNTER — Ambulatory Visit: Payer: Medicare Other | Admitting: Family Medicine

## 2020-11-07 NOTE — Telephone Encounter (Signed)
I was able to get an appt for patient to be seen today at [facility] [place]. Patient's son refused appt stating that it was a short notice. And that he would just continue to work with patients nurse to keep area clean and treating with OTC medications. He did however schedule for patient to be seen in office for Thursday 11/13/20.

## 2020-11-11 ENCOUNTER — Telehealth: Payer: Self-pay

## 2020-11-11 NOTE — Telephone Encounter (Signed)
Have you seen anything on Mrs Greenbaum?

## 2020-11-11 NOTE — Telephone Encounter (Signed)
Copied from [place] [phone]. Topic: General - Other >> Nov 11, 2020 10:39 AM Rayann Heman wrote: Reason for CRM: Colletta Maryland with [facility] called and stated that she faxed over some information on patient. She states that she faxed that over on 11/08/20. She would like a nurse to give a call back. PLease advise

## 2020-11-13 ENCOUNTER — Ambulatory Visit (INDEPENDENT_AMBULATORY_CARE_PROVIDER_SITE_OTHER): Payer: Medicare Other | Admitting: Family Medicine

## 2020-11-13 ENCOUNTER — Other Ambulatory Visit: Payer: Self-pay

## 2020-11-13 ENCOUNTER — Encounter: Payer: Self-pay | Admitting: Family Medicine

## 2020-11-13 VITALS — BP 163/59 | HR 59 | Temp 97.8°F | Wt 119.0 lb

## 2020-11-13 DIAGNOSIS — F0280 Dementia in other diseases classified elsewhere without behavioral disturbance: Secondary | ICD-10-CM

## 2020-11-13 DIAGNOSIS — G301 Alzheimer's disease with late onset: Secondary | ICD-10-CM | POA: Diagnosis not present

## 2020-11-13 DIAGNOSIS — F3341 Major depressive disorder, recurrent, in partial remission: Secondary | ICD-10-CM | POA: Diagnosis not present

## 2020-11-13 DIAGNOSIS — E7800 Pure hypercholesterolemia, unspecified: Secondary | ICD-10-CM | POA: Diagnosis not present

## 2020-11-13 DIAGNOSIS — G309 Alzheimer's disease, unspecified: Secondary | ICD-10-CM | POA: Diagnosis not present

## 2020-11-13 DIAGNOSIS — I10 Essential (primary) hypertension: Secondary | ICD-10-CM | POA: Diagnosis not present

## 2020-11-13 DIAGNOSIS — E039 Hypothyroidism, unspecified: Secondary | ICD-10-CM

## 2020-11-13 DIAGNOSIS — F02818 Dementia in other diseases classified elsewhere, unspecified severity, with other behavioral disturbance: Secondary | ICD-10-CM

## 2020-11-13 DIAGNOSIS — C44619 Basal cell carcinoma of skin of left upper limb, including shoulder: Secondary | ICD-10-CM | POA: Diagnosis not present

## 2020-11-13 DIAGNOSIS — F0281 Dementia in other diseases classified elsewhere with behavioral disturbance: Secondary | ICD-10-CM | POA: Diagnosis not present

## 2020-11-13 NOTE — Progress Notes (Signed)
Established patient visit   Patient: Paige Baldwin   DOB: [birth year]   74 y.o. Female  MRN: [id number] Visit Date: 11/13/2020  Today's healthcare provider: [name], MD   No chief complaint on file.  Subjective    HPI  Daughter noticed last week while giving her a bath. Thinks its been there at least 2 weeks Symptoms of pain and redness Thinks it may have had discharged because it looks like it had busted open and beginning to heal  -son not aware of what medications patient is or is not taking    Medications: Outpatient Medications Prior to Visit  Medication Sig   aspirin EC 325 MG tablet Take 1 tablet (325 mg total) by mouth daily.   atorvastatin (LIPITOR) 80 MG tablet Take 1 tablet (80 mg total) by mouth daily.   lamoTRIgine (LAMICTAL) 25 MG tablet Take 25-50 mg by mouth 2 (two) times daily. Take 25 mg (one tablet) by mouth once daily for 14 days, then increase to 50 mg (Two tablets) once daily for 14 days, then increase to 50 mg (two tablets) twice daily, thereafter. (Patient not taking: Reported on 03/04/2020)   levothyroxine (SYNTHROID) 100 MCG tablet TAKE 1 TABLET BY MOUTH DAILY BEFORE BREAKFAST. (Patient taking differently: Take 100 mcg by mouth daily before breakfast.)   metoprolol tartrate (LOPRESSOR) 50 MG tablet TAKE 1 TABLET BY MOUTH EVERY DAY   OLANZapine (ZYPREXA) 5 MG tablet Take 1 tablet (5 mg total) by mouth at bedtime.   pantoprazole (PROTONIX) 40 MG tablet Take 1 tablet (40 mg total) by mouth daily.   risperiDONE (RISPERDAL) 0.5 MG tablet Take 1 tablet (0.5 mg total) by mouth daily as needed (agitation).   sertraline (ZOLOFT) 50 MG tablet TAKE 1 TABLET BY MOUTH EVERY DAY   No facility-administered medications prior to visit.    Review of Systems  Constitutional:  Negative for appetite change, chills, fatigue and fever.  Respiratory:  Negative for chest tightness and shortness of breath.   Cardiovascular:  Negative for chest pain and  palpitations.  Gastrointestinal:  Negative for abdominal pain, nausea and vomiting.  Skin:  Positive for rash.  Neurological:  Negative for dizziness and weakness.   Last CBC Lab Results  Component Value Date   WBC 8.2 12/24/2019   HGB 12.6 12/24/2019   HCT 38.4 12/24/2019   MCV 88.1 12/24/2019   MCH 28.9 12/24/2019   RDW 12.8 12/24/2019   PLT 191 63/14/9702   Last metabolic panel Lab Results  Component Value Date   GLUCOSE 106 (H) 12/24/2019   NA 139 12/24/2019   K 4.0 12/24/2019   CL 103 12/24/2019   CO2 28 12/24/2019   BUN 20 12/24/2019   CREATININE 0.86 12/24/2019   GFRNONAA >60 12/24/2019   GFRAA >60 11/23/2019   CALCIUM 8.9 12/24/2019   PROT 7.7 12/21/2019   ALBUMIN 4.5 12/21/2019   LABGLOB 2.7 11/22/2018   AGRATIO 1.6 11/22/2018   BILITOT 0.7 12/21/2019   ALKPHOS 85 12/21/2019   AST 19 12/21/2019   ALT 19 12/21/2019   ANIONGAP 8 12/24/2019   Last lipids Lab Results  Component Value Date   CHOL 203 (H) 12/22/2019   HDL 51 12/22/2019   LDLCALC 137 (H) 12/22/2019   TRIG 74 12/22/2019   CHOLHDL 4.0 12/22/2019   Last hemoglobin A1c Lab Results  Component Value Date   HGBA1C 5.5 12/22/2019   Last thyroid functions Lab Results  Component Value Date   TSH  1.420 12/31/2019   Last vitamin D No results found for: 25OHVITD2, 25OHVITD3, VD25OH Last vitamin B12 and Folate No results found for: VITAMINB12, FOLATE     Objective    BP (!) 163/59 (BP Location: Right Arm, Patient Position: Sitting, Cuff Size: Normal)   Pulse (!) 59   Temp 97.8 F (36.6 C) (Oral)   Wt 119 lb (54 kg)   LMP  (LMP Unknown)   SpO2 100%   BMI 23.24 kg/m  BP Readings from Last 3 Encounters:  11/13/20 (!) 163/59  05/07/20 (!) 150/72  03/04/20 (!) 153/60   Wt Readings from Last 3 Encounters:  11/13/20 119 lb (54 kg)  05/07/20 118 lb (53.5 kg)  03/04/20 115 lb (52.2 kg)      Physical Exam Vitals reviewed.  Constitutional:      Appearance: Normal appearance. She is  well-developed and normal weight.  HENT:     Head: Normocephalic.     Right Ear: External ear normal.     Left Ear: Tympanic membrane and external ear normal.     Nose: Nose normal.     Mouth/Throat:     Pharynx: Oropharynx is clear.  Eyes:     General: No scleral icterus.    Conjunctiva/sclera: Conjunctivae normal.  Neck:     Thyroid: No thyromegaly.  Cardiovascular:     Rate and Rhythm: Normal rate and regular rhythm.     Heart sounds: Normal heart sounds.  Pulmonary:     Effort: Pulmonary effort is normal.     Breath sounds: Normal breath sounds.  Abdominal:     Palpations: Abdomen is soft.  Musculoskeletal:     Right lower leg: No edema.     Left lower leg: No edema.  Lymphadenopathy:     Cervical: No cervical adenopathy.  Skin:    General: Skin is warm and dry.     Comments: Possible basal cell carcinoma Of the left shoulder.  Neurological:     General: No focal deficit present.     Mental Status: She is alert.  Psychiatric:        Mood and Affect: Mood normal.        Behavior: Behavior normal.      No results found for any visits on 11/13/20.  Assessment & Plan     1. Alzheimer disease ([place]) With behavioral disturbance.  Now in assisted living - CBC - TSH - Lipid panel - Ambulatory referral to Dermatology - Comprehensive Metabolic Panel (CMET)  2. Basal cell carcinoma (BCC) of skin of left upper extremity including shoulder Basal cell carcinoma versus keratocanthoma. - Ambulatory referral to Dermatology - Comprehensive Metabolic Panel (CMET)  3. Essential hypertension Fair control.  Consider adding ARB or amlodipine - CBC - Comprehensive Metabolic Panel (CMET)  4. Adult hypothyroidism  - TSH - Comprehensive Metabolic Panel (CMET)  5. Pure hypercholesterolemia On atorvastatin 80.  Consider stopping in the next few years. - Lipid panel - Comprehensive Metabolic Panel (CMET)  6. Late onset Alzheimer's dementia with behavioral disturbance  [facility]) Patient with behavioral disturbance.  She is on risperidone for this.  7. Recurrent major depressive disorder, in partial remission ([place]) On sertraline.   No follow-ups on file.      I, [name], MD, have reviewed all documentation for this visit. The documentation on 11/16/20 for the exam, diagnosis, procedures, and orders are all accurate and complete.    Barbarann Kelly Cranford Mon, MD  [facility] [phone] (phone) [phone] (fax)  [facility]

## 2020-11-14 LAB — CBC
Hematocrit: 37 % (ref 34.0–46.6)
Hemoglobin: 12.5 g/dL (ref 11.1–15.9)
MCH: 29.1 pg (ref 26.6–33.0)
MCHC: 33.8 g/dL (ref 31.5–35.7)
MCV: 86 fL (ref 79–97)
Platelets: 147 10*3/uL — ABNORMAL LOW (ref 150–450)
RBC: 4.29 x10E6/uL (ref 3.77–5.28)
RDW: 13.1 % (ref 11.7–15.4)
WBC: 8.4 10*3/uL (ref 3.4–10.8)

## 2020-11-14 LAB — TSH: TSH: 0.015 u[IU]/mL — ABNORMAL LOW (ref 0.450–4.500)

## 2020-11-14 LAB — LIPID PANEL
Chol/HDL Ratio: 2.4 ratio (ref 0.0–4.4)
Cholesterol, Total: 133 mg/dL (ref 100–199)
HDL: 56 mg/dL (ref >39)
LDL Chol Calc (NIH): 63 mg/dL (ref 0–99)
Triglycerides: 67 mg/dL (ref 0–149)
VLDL Cholesterol Cal: 14 mg/dL (ref 5–40)

## 2020-11-18 ENCOUNTER — Other Ambulatory Visit: Payer: Self-pay | Admitting: *Deleted

## 2020-11-18 DIAGNOSIS — E039 Hypothyroidism, unspecified: Secondary | ICD-10-CM

## 2020-11-18 MED ORDER — LEVOTHYROXINE SODIUM 50 MCG PO TABS: 50.0000 ug | ORAL_TABLET | Freq: Every day | ORAL | 3 refills | Status: DC

## 2020-11-20 ENCOUNTER — Telehealth: Payer: Self-pay | Admitting: Family Medicine

## 2020-11-20 DIAGNOSIS — C44629 Squamous cell carcinoma of skin of left upper limb, including shoulder: Secondary | ICD-10-CM | POA: Diagnosis not present

## 2020-11-20 DIAGNOSIS — D485 Neoplasm of uncertain behavior of skin: Secondary | ICD-10-CM | POA: Diagnosis not present

## 2020-11-20 NOTE — Telephone Encounter (Signed)
Paige Baldwin with [facility] is calling in for assistance. She says that they received a Rx for levothyroxine (SYNTHROID) 50 MCG tablet . Paige Baldwin states that their records reflect 100MCG tablet. She says that they would need a order faxed to them with the new Rx in order to give this to pt   Fax: [phone]

## 2020-11-21 NOTE — Telephone Encounter (Signed)
Order written and waiting for provider to sign.

## 2020-12-02 NOTE — Telephone Encounter (Signed)
Order was faxed.

## 2020-12-09 ENCOUNTER — Ambulatory Visit: Payer: Self-pay | Admitting: Family Medicine

## 2020-12-09 NOTE — Progress Notes (Deleted)
      Established patient visit   Patient: Paige Baldwin   DOB: 1945/06/13   75 y.o. Female  MRN: [id number] Visit Date: 12/09/2020  Today's healthcare provider: [name], MD   No chief complaint on file.  Subjective    HPI  Follow up for With behavioral disturbance.  Now in assisted living.  The patient was last seen for this 3 weeks ago. Changes made at last visit includes; With behavioral disturbance.  Now in assisted living.  She reports {excellent/good/fair/poor:19665} compliance with treatment. She feels that condition is {improved/worse/unchanged:3041574}. She {is/is not:21021397} having side effects. ***  -----------------------------------------------------------------------------------------   {Link to patient history deactivated due to formatting error:1}  Medications: Outpatient Medications Prior to Visit  Medication Sig   aspirin EC 325 MG tablet Take 1 tablet (325 mg total) by mouth daily.   atorvastatin (LIPITOR) 80 MG tablet Take 1 tablet (80 mg total) by mouth daily.   lamoTRIgine (LAMICTAL) 25 MG tablet Take 25-50 mg by mouth 2 (two) times daily. Take 25 mg (one tablet) by mouth once daily for 14 days, then increase to 50 mg (Two tablets) once daily for 14 days, then increase to 50 mg (two tablets) twice daily, thereafter. (Patient not taking: Reported on 03/04/2020)   levothyroxine (SYNTHROID) 50 MCG tablet Take 1 tablet (50 mcg total) by mouth daily before breakfast.   metoprolol tartrate (LOPRESSOR) 50 MG tablet TAKE 1 TABLET BY MOUTH EVERY DAY   OLANZapine (ZYPREXA) 5 MG tablet Take 1 tablet (5 mg total) by mouth at bedtime.   pantoprazole (PROTONIX) 40 MG tablet Take 1 tablet (40 mg total) by mouth daily.   risperiDONE (RISPERDAL) 0.5 MG tablet Take 1 tablet (0.5 mg total) by mouth daily as needed (agitation).   sertraline (ZOLOFT) 50 MG tablet TAKE 1 TABLET BY MOUTH EVERY DAY   No facility-administered medications prior to visit.    Review  of Systems  Constitutional:  Negative for appetite change, chills, fatigue and fever.  Respiratory:  Negative for chest tightness and shortness of breath.   Cardiovascular:  Negative for chest pain and palpitations.  Gastrointestinal:  Negative for abdominal pain, nausea and vomiting.  Neurological:  Negative for dizziness and weakness.   {Labs  Heme  Chem  Endocrine  Serology  Results Review (optional):23779}   Objective    LMP  (LMP Unknown)  {Show previous vital signs (optional):23777}  Physical Exam  ***  No results found for any visits on 12/09/20.  Assessment & Plan     ***  No follow-ups on file.      {provider attestation***:1}   [name], MD  [facility] [phone] (phone) [phone] (fax)  [place]

## 2020-12-17 DIAGNOSIS — M2041 Other hammer toe(s) (acquired), right foot: Secondary | ICD-10-CM | POA: Diagnosis not present

## 2020-12-17 DIAGNOSIS — M2042 Other hammer toe(s) (acquired), left foot: Secondary | ICD-10-CM | POA: Diagnosis not present

## 2020-12-23 DIAGNOSIS — R21 Rash and other nonspecific skin eruption: Secondary | ICD-10-CM | POA: Diagnosis not present

## 2020-12-23 DIAGNOSIS — C44629 Squamous cell carcinoma of skin of left upper limb, including shoulder: Secondary | ICD-10-CM | POA: Diagnosis not present

## 2021-01-07 ENCOUNTER — Telehealth: Payer: Self-pay | Admitting: Family Medicine

## 2021-01-07 NOTE — Telephone Encounter (Signed)
Colletta Maryland calling with Paige Baldwin called and stated she would like to know if the patient should be taking lamoTRIgine (LAMICTAL) 25 MG tablet    she states medication was not on FL2 and patient has not taken it at [facility]   Please advise

## 2021-01-08 NOTE — Telephone Encounter (Signed)
LMOVM for Colletta Maryland to return call. Okay for pec triage to advise [place]. Thank you.

## 2021-01-13 NOTE — Telephone Encounter (Signed)
Colletta Maryland was was advised.

## 2021-09-11 IMAGING — MR MR MRA HEAD W/O CM
1 series · 19 of 48 positions shown · non-contrast
Comparison: Concurrent MRI head

CLINICAL DATA: Neuro deficit, stroke suspected

EXAM:
MRA HEAD WITHOUT CONTRAST
TECHNIQUE: Angiographic images of the Circle of Willis were obtained using MRA
technique without intravenous contrast.

[Series 9: TOF · axial · 0.5mm · 0.41mm/px · z∈[-8,+80]mm · 19 of 205 slices shown]
[im 1/205]
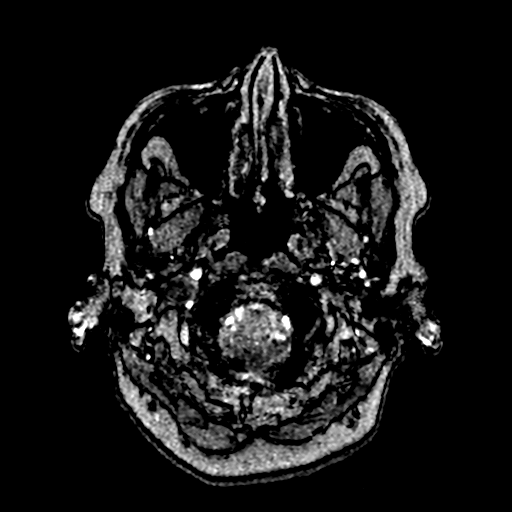
[im 5/205]
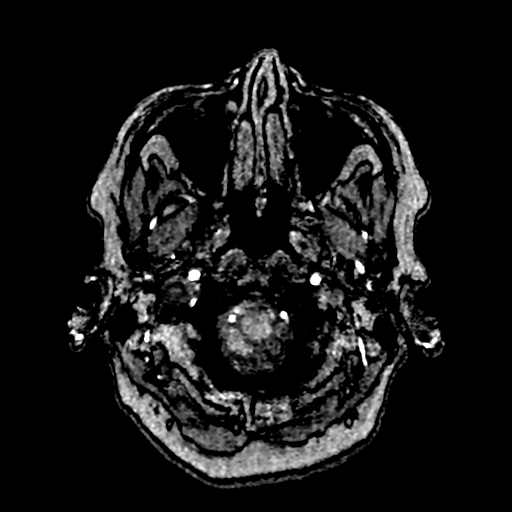
[im 9/205]
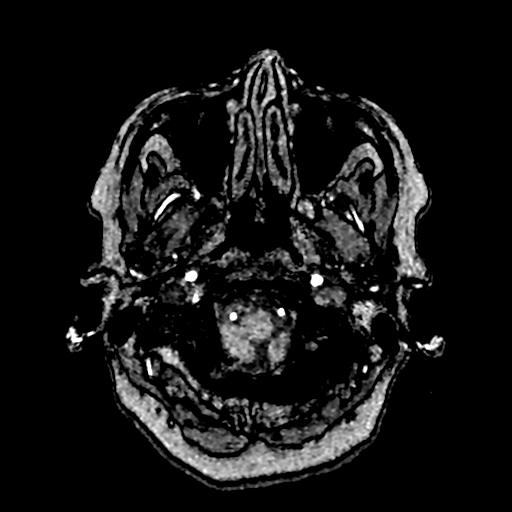
[im 14/205]
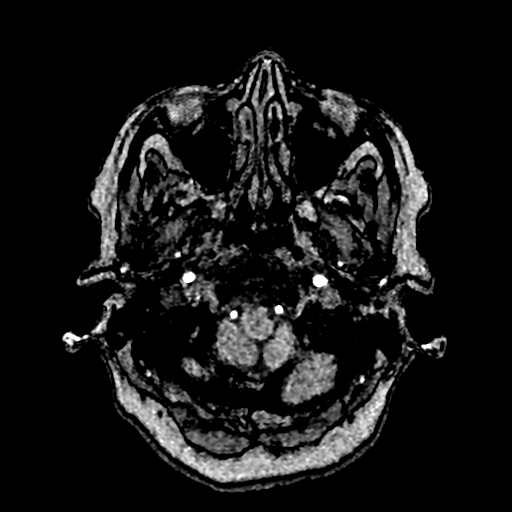
[im 18/205]
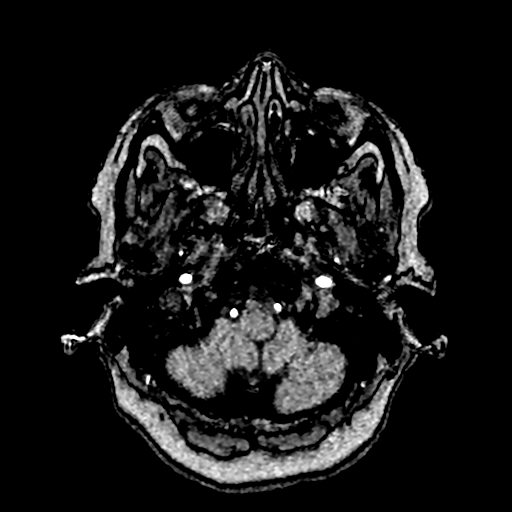
[im 22/205]
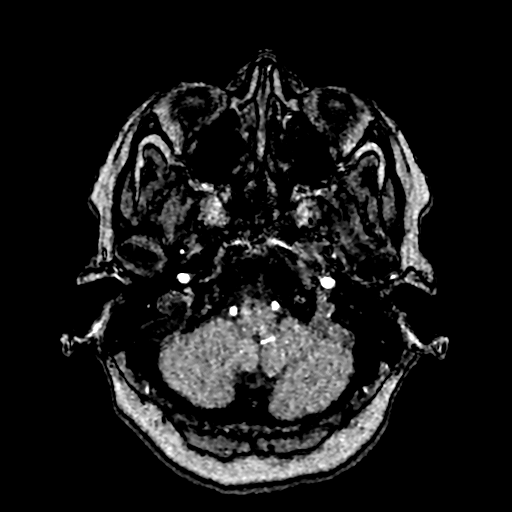
[im 27/205]
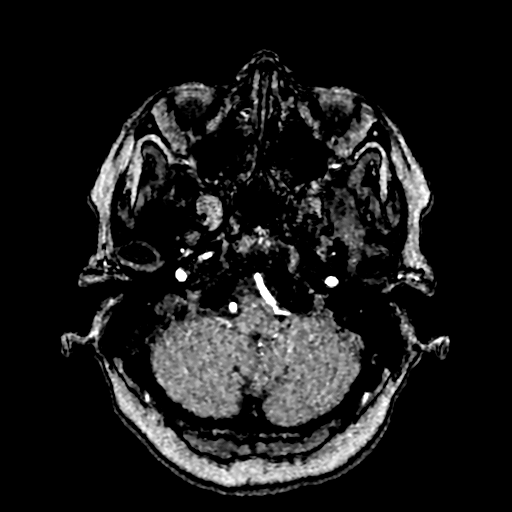
[im 31/205]
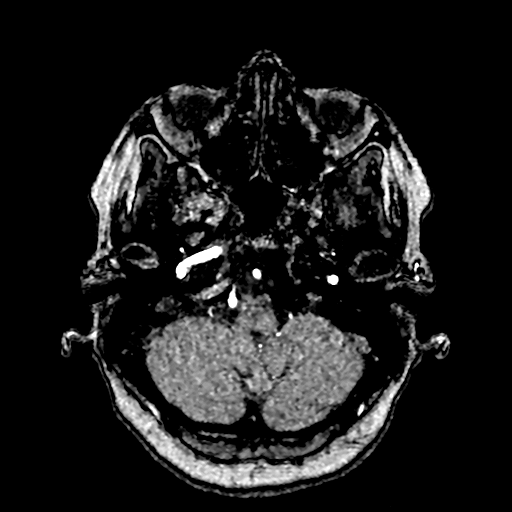
[im 35/205]
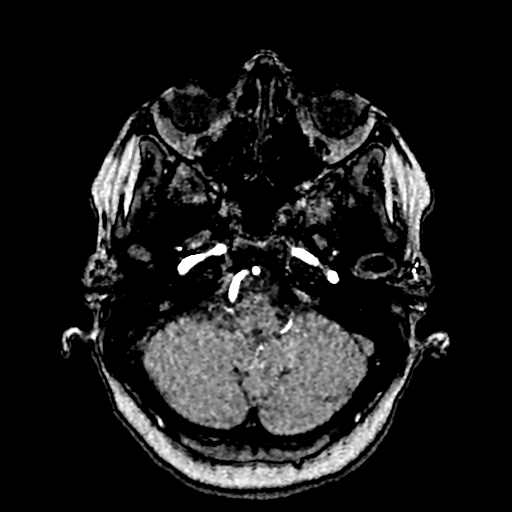
[im 40/205]
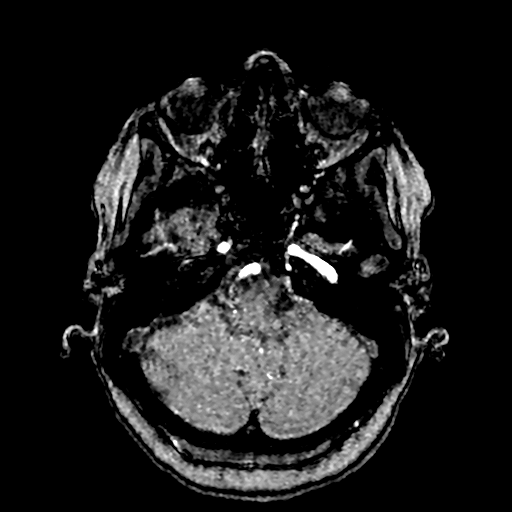
[im 44/205]
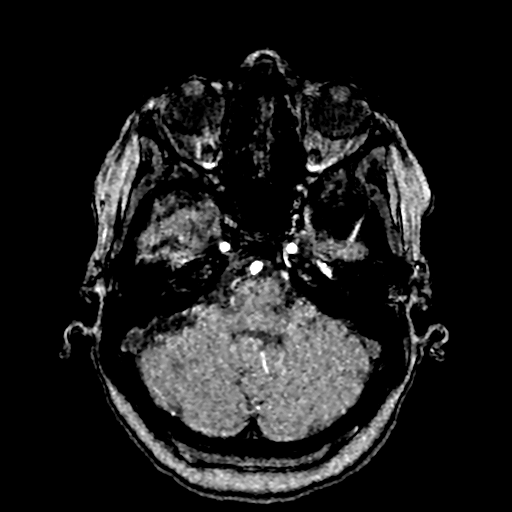
[im 66/205]
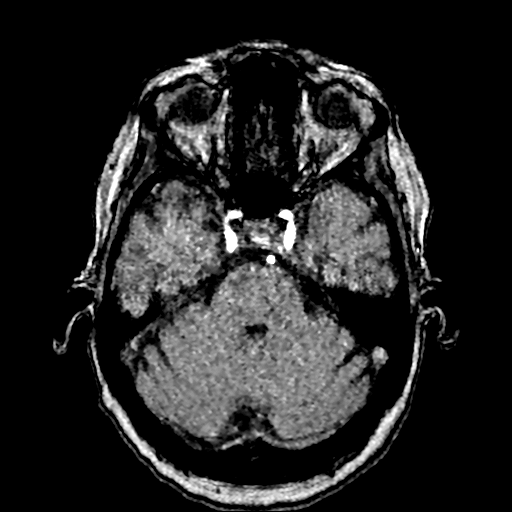
[im 92/205]
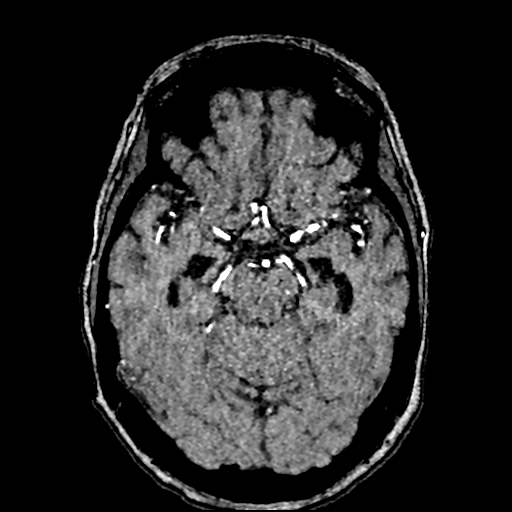
[im 105/205]
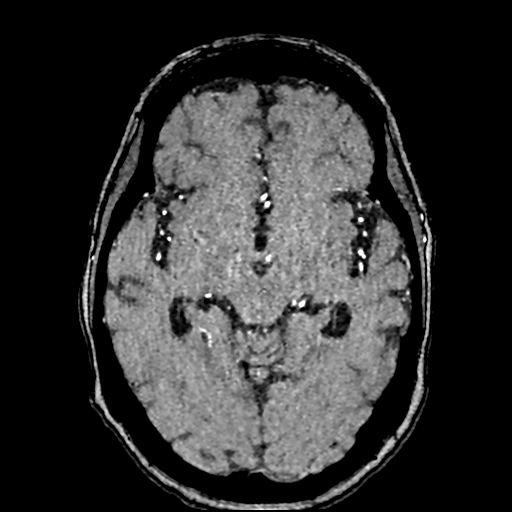
[im 118/205]
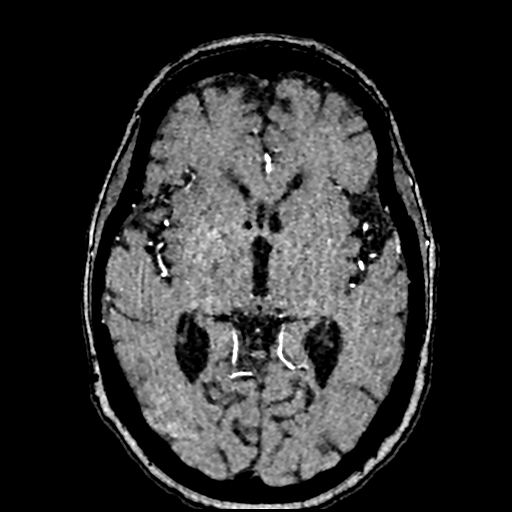
[im 144/205]
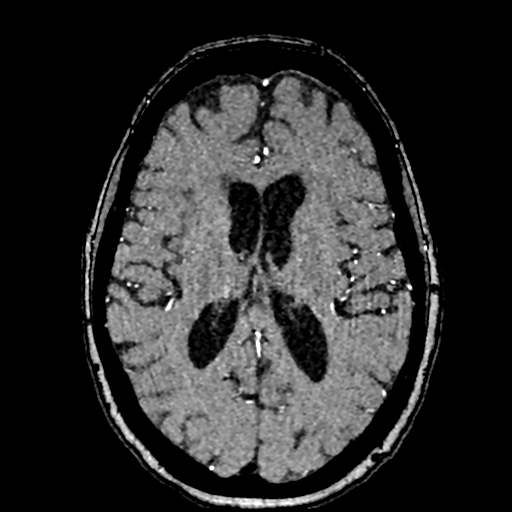
[im 170/205]
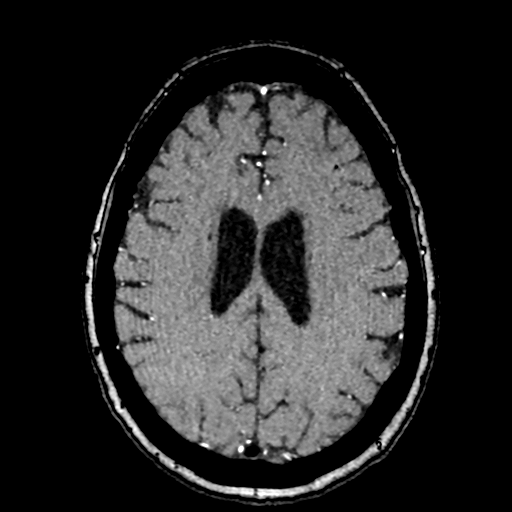
[im 174/205]
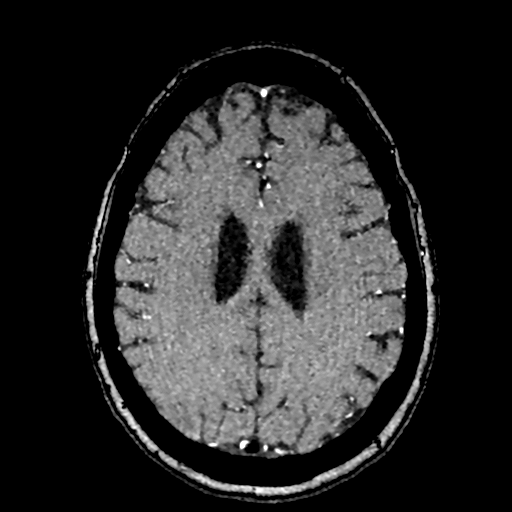
[im 196/205]
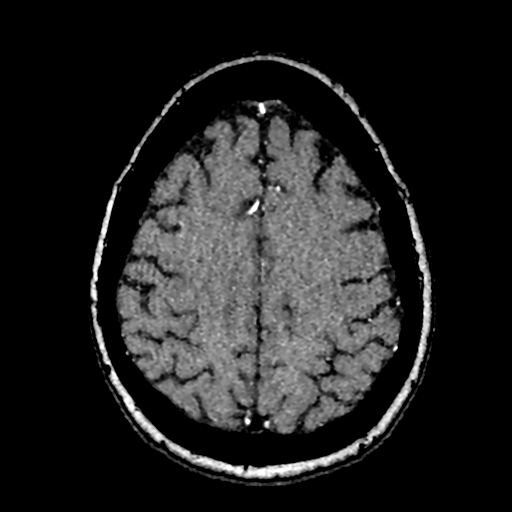

[19 of 48 positions shown; findings below may reference images not displayed]

FINDINGS: MRA HEAD FINDINGS

Anterior circulation: Patent ACAs. Patent ICAs. Patent left MCA.
High-grade narrowing of the mid right M1 segment with distal
reconstitution. No aneurysm.

Posterior circulation: Patent PICA. Patent V4 segments and basilar
artery. Patent posterior cerebral arteries. Mild right P1 segment
narrowing. Mild to moderate narrowing of the proximal to mid left
superior cerebellar artery. No evidence of thrombosis.

Venous sinuses: As permitted by contrast timing, patent.

Anatomic variants: The bilateral posterior communicating arteries
are either diminutive or congenitally absent.
IMPRESSION: High-grade narrowing of the mid right M1 segment with distal
reconstitution.

Mild to moderate proximal to mid left superior cerebellar artery
narrowing.

Mild right P1 segment narrowing.

These results were called by telephone at the time of interpretation
on 12/22/2019 at [DATE] to provider NP Gjulie Mazlum , who
verbally acknowledged these results.

## 2021-10-07 ENCOUNTER — Encounter: Payer: Self-pay | Admitting: Family Medicine

## 2021-10-07 ENCOUNTER — Ambulatory Visit (INDEPENDENT_AMBULATORY_CARE_PROVIDER_SITE_OTHER): Payer: Medicare Other | Admitting: Family Medicine

## 2021-10-07 VITALS — BP 122/64 | HR 57 | Resp 16 | Wt 157.0 lb

## 2021-10-07 DIAGNOSIS — R2681 Unsteadiness on feet: Secondary | ICD-10-CM

## 2021-10-07 DIAGNOSIS — E7800 Pure hypercholesterolemia, unspecified: Secondary | ICD-10-CM | POA: Diagnosis not present

## 2021-10-07 DIAGNOSIS — R739 Hyperglycemia, unspecified: Secondary | ICD-10-CM

## 2021-10-07 DIAGNOSIS — E039 Hypothyroidism, unspecified: Secondary | ICD-10-CM

## 2021-10-07 DIAGNOSIS — I10 Essential (primary) hypertension: Secondary | ICD-10-CM | POA: Diagnosis not present

## 2021-10-07 DIAGNOSIS — G301 Alzheimer's disease with late onset: Secondary | ICD-10-CM

## 2021-10-07 DIAGNOSIS — F02818 Dementia in other diseases classified elsewhere, unspecified severity, with other behavioral disturbance: Secondary | ICD-10-CM

## 2021-10-07 NOTE — Patient Instructions (Signed)
STOP ATORVASTATIN.

## 2021-10-07 NOTE — Progress Notes (Signed)
Established patient visit  I,[name],acting as a scribe for [name], MD.,have documented all relevant documentation on the behalf of [name], MD,as directed by  [name], MD while in the presence of [name], MD.   Patient: Paige Baldwin   DOB: 1945-11-17   76 y.o. Female  MRN: [id number] Visit Date: 10/07/2021  Today's healthcare provider: [name], MD   No chief complaint on file.  Subjective    HPI  Patient is here concerning posture. Patient is starting to hunch over when she walks. Patient also has been slumping and tilting to the side when she is sitting in a chairs.  Patient is having no more behavioral issues since she is in assisted living no medication. Patient is in memory care/assisted living.  Family is very supportive.  Daughter does not know what medication she is on. Medications: Outpatient Medications Prior to Visit  Medication Sig   aspirin EC 325 MG tablet Take 1 tablet (325 mg total) by mouth daily.   atorvastatin (LIPITOR) 80 MG tablet Take 1 tablet (80 mg total) by mouth daily.   levothyroxine (SYNTHROID) 50 MCG tablet Take 1 tablet (50 mcg total) by mouth daily before breakfast.   metoprolol tartrate (LOPRESSOR) 50 MG tablet TAKE 1 TABLET BY MOUTH EVERY DAY   OLANZapine (ZYPREXA) 5 MG tablet Take 1 tablet (5 mg total) by mouth at bedtime.   pantoprazole (PROTONIX) 40 MG tablet Take 1 tablet (40 mg total) by mouth daily.   risperiDONE (RISPERDAL) 0.5 MG tablet Take 1 tablet (0.5 mg total) by mouth daily as needed (agitation).   sertraline (ZOLOFT) 50 MG tablet TAKE 1 TABLET BY MOUTH EVERY DAY   lamoTRIgine (LAMICTAL) 25 MG tablet Take 25-50 mg by mouth 2 (two) times daily. Take 25 mg (one tablet) by mouth once daily for 14 days, then increase to 50 mg (Two tablets) once daily for 14 days, then increase to 50 mg (two tablets) twice daily, thereafter. (Patient not taking: Reported on 03/04/2020)   No  facility-administered medications prior to visit.    Review of Systems  Last lipids Lab Results  Component Value Date   CHOL 133 11/13/2020   HDL 56 11/13/2020   LDLCALC 63 11/13/2020   TRIG 67 11/13/2020   CHOLHDL 2.4 11/13/2020       Objective    BP 122/64 (BP Location: Right Arm, Patient Position: Sitting, Cuff Size: Normal)   Pulse (!) 57   Resp 16   Wt 157 lb (71.2 kg)   LMP  (LMP Unknown)   SpO2 97%   BMI 30.66 kg/m  BP Readings from Last 3 Encounters:  10/07/21 122/64  11/13/20 (!) 163/59  05/07/20 (!) 150/72   Wt Readings from Last 3 Encounters:  10/07/21 157 lb (71.2 kg)  11/13/20 119 lb (54 kg)  05/07/20 118 lb (53.5 kg)      Physical Exam Vitals reviewed.  Constitutional:      Appearance: Normal appearance. She is well-developed and normal weight.  HENT:     Head: Normocephalic.     Right Ear: External ear normal.     Left Ear: Tympanic membrane and external ear normal.     Nose: Nose normal.     Mouth/Throat:     Pharynx: Oropharynx is clear.  Eyes:     General: No scleral icterus.    Conjunctiva/sclera: Conjunctivae normal.  Neck:     Thyroid: No thyromegaly.  Cardiovascular:  Rate and Rhythm: Normal rate and regular rhythm.     Heart sounds: Normal heart sounds.  Pulmonary:     Effort: Pulmonary effort is normal.     Breath sounds: Normal breath sounds.  Abdominal:     Palpations: Abdomen is soft.  Musculoskeletal:     Right lower leg: No edema.     Left lower leg: No edema.  Lymphadenopathy:     Cervical: No cervical adenopathy.  Skin:    General: Skin is warm and dry.     Comments: Possible basal cell carcinoma Of the left shoulder.  Neurological:     General: No focal deficit present.     Mental Status: She is alert.  Psychiatric:        Mood and Affect: Mood normal.        Behavior: Behavior normal.       No results found for any visits on 10/07/21.  Assessment & Plan     1. Essential hypertension Good blood  pressure control - Hemoglobin A1c - CBC w/Diff/Platelet - Comprehensive Metabolic Panel (CMET) - Lamotrigine level - TSH  2. Adult hypothyroidism Treat for euthyroid TSH - Hemoglobin A1c - CBC w/Diff/Platelet - Comprehensive Metabolic Panel (CMET) - Lamotrigine level - TSH  3. Pure hypercholesterolemia Is  on atorvastatin. - Hemoglobin A1c - CBC w/Diff/Platelet - Comprehensive Metabolic Panel (CMET) - Lamotrigine level - TSH  4. Hyperglycemia Check A1c - Hemoglobin A1c - CBC w/Diff/Platelet - Comprehensive Metabolic Panel (CMET) - Lamotrigine level - TSH  5. Late onset Alzheimer's dementia with behavioral disturbance ([place]) This is probably Alzheimer's and vascular dementia and combined. Right now it appears she is on Lamictal Zyprexa and Risperdal.  I am not sure she needs all of these mood stabilizers. - Hemoglobin A1c - CBC w/Diff/Platelet - Comprehensive Metabolic Panel (CMET) - Lamotrigine level - TSH  6. Unsteady gait Physical therapy should help some with this. - Ambulatory referral to Physical Therapy   No follow-ups on file.      I, [name], MD, have reviewed all documentation for this visit. The documentation on 10/12/21 for the exam, diagnosis, procedures, and orders are all accurate and complete.    Latreece Mochizuki Cranford Mon, MD  [facility] [phone] (phone) [phone] (fax)  [place]

## 2021-10-08 LAB — CBC WITH DIFFERENTIAL/PLATELET
Basophils Absolute: 0.2 10*3/uL (ref 0.0–0.2)
Basos: 2 %
EOS (ABSOLUTE): 0.1 10*3/uL (ref 0.0–0.4)
Eos: 1 %
Hematocrit: 36.5 % (ref 34.0–46.6)
Hemoglobin: 12.1 g/dL (ref 11.1–15.9)
Lymphocytes Absolute: 2.5 10*3/uL (ref 0.7–3.1)
Lymphs: 26 %
MCH: 29.3 pg (ref 26.6–33.0)
MCHC: 33.2 g/dL (ref 31.5–35.7)
MCV: 88 fL (ref 79–97)
Monocytes Absolute: 2.9 10*3/uL — ABNORMAL HIGH (ref 0.1–0.9)
Monocytes: 30 %
Neutrophils Absolute: 4 10*3/uL (ref 1.4–7.0)
Neutrophils: 41 %
Platelets: 156 10*3/uL (ref 150–450)
RBC: 4.13 x10E6/uL (ref 3.77–5.28)
RDW: 13.3 % (ref 11.7–15.4)
WBC: 9.7 10*3/uL (ref 3.4–10.8)

## 2021-10-08 LAB — COMPREHENSIVE METABOLIC PANEL
ALT: 17 IU/L (ref 0–32)
AST: 20 IU/L (ref 0–40)
Albumin/Globulin Ratio: 1.7 (ref 1.2–2.2)
Albumin: 4.4 g/dL (ref 3.8–4.8)
Alkaline Phosphatase: 131 IU/L — ABNORMAL HIGH (ref 44–121)
BUN/Creatinine Ratio: 17 (ref 12–28)
BUN: 17 mg/dL (ref 8–27)
Bilirubin Total: 0.3 mg/dL (ref 0.0–1.2)
CO2: 25 mmol/L (ref 20–29)
Calcium: 9.4 mg/dL (ref 8.7–10.3)
Chloride: 103 mmol/L (ref 96–106)
Creatinine, Ser: 0.98 mg/dL (ref 0.57–1.00)
Globulin, Total: 2.6 g/dL (ref 1.5–4.5)
Glucose: 96 mg/dL (ref 70–99)
Potassium: 4.4 mmol/L (ref 3.5–5.2)
Sodium: 141 mmol/L (ref 134–144)
Total Protein: 7 g/dL (ref 6.0–8.5)
eGFR: 60 mL/min/{1.73_m2} (ref >59)

## 2021-10-08 LAB — HEMOGLOBIN A1C
Est. average glucose Bld gHb Est-mCnc: 128 mg/dL
Hgb A1c MFr Bld: 6.1 % — ABNORMAL HIGH (ref 4.8–5.6)

## 2021-10-08 LAB — TSH: TSH: 6.34 u[IU]/mL — ABNORMAL HIGH (ref 0.450–4.500)

## 2021-10-08 LAB — LAMOTRIGINE LEVEL: Lamotrigine Lvl: <1 ug/mL — ABNORMAL LOW (ref 2.0–20.0)

## 2022-04-19 ENCOUNTER — Telehealth: Payer: Self-pay

## 2022-04-19 NOTE — Telephone Encounter (Signed)
Called and LVM confirming new patient appointment. Explained visitor and mask policy. Gave number to call back with any questions or concerns.

## 2022-04-20 ENCOUNTER — Inpatient Hospital Stay: Payer: Medicare Other

## 2022-04-20 ENCOUNTER — Inpatient Hospital Stay: Payer: Medicare Other | Attending: Oncology | Admitting: Oncology

## 2022-04-20 ENCOUNTER — Encounter: Payer: Self-pay | Admitting: Oncology

## 2022-04-20 VITALS — BP 149/81 | HR 68 | Temp 97.6°F | Ht 60.0 in | Wt 164.0 lb

## 2022-04-20 DIAGNOSIS — F0393 Unspecified dementia, unspecified severity, with mood disturbance: Secondary | ICD-10-CM | POA: Insufficient documentation

## 2022-04-20 DIAGNOSIS — F32A Depression, unspecified: Secondary | ICD-10-CM | POA: Diagnosis not present

## 2022-04-20 DIAGNOSIS — E785 Hyperlipidemia, unspecified: Secondary | ICD-10-CM | POA: Insufficient documentation

## 2022-04-20 DIAGNOSIS — D72821 Monocytosis (symptomatic): Secondary | ICD-10-CM

## 2022-04-20 DIAGNOSIS — Z9049 Acquired absence of other specified parts of digestive tract: Secondary | ICD-10-CM | POA: Diagnosis not present

## 2022-04-20 DIAGNOSIS — Z8249 Family history of ischemic heart disease and other diseases of the circulatory system: Secondary | ICD-10-CM | POA: Insufficient documentation

## 2022-04-20 DIAGNOSIS — Z79899 Other long term (current) drug therapy: Secondary | ICD-10-CM | POA: Insufficient documentation

## 2022-04-20 DIAGNOSIS — Z832 Family history of diseases of the blood and blood-forming organs and certain disorders involving the immune mechanism: Secondary | ICD-10-CM | POA: Diagnosis not present

## 2022-04-20 DIAGNOSIS — E039 Hypothyroidism, unspecified: Secondary | ICD-10-CM | POA: Insufficient documentation

## 2022-04-20 DIAGNOSIS — Z823 Family history of stroke: Secondary | ICD-10-CM | POA: Insufficient documentation

## 2022-04-20 DIAGNOSIS — Z803 Family history of malignant neoplasm of breast: Secondary | ICD-10-CM | POA: Diagnosis not present

## 2022-04-20 DIAGNOSIS — Z79890 Hormone replacement therapy (postmenopausal): Secondary | ICD-10-CM | POA: Insufficient documentation

## 2022-04-20 DIAGNOSIS — Z90710 Acquired absence of both cervix and uterus: Secondary | ICD-10-CM | POA: Insufficient documentation

## 2022-04-20 DIAGNOSIS — I10 Essential (primary) hypertension: Secondary | ICD-10-CM | POA: Diagnosis not present

## 2022-04-20 DIAGNOSIS — Z90721 Acquired absence of ovaries, unilateral: Secondary | ICD-10-CM | POA: Insufficient documentation

## 2022-04-20 DIAGNOSIS — Z833 Family history of diabetes mellitus: Secondary | ICD-10-CM | POA: Insufficient documentation

## 2022-04-20 LAB — CBC WITH DIFFERENTIAL/PLATELET
Abs Immature Granulocytes: 0.27 10*3/uL — ABNORMAL HIGH (ref 0.00–0.07)
Basophils Absolute: 0 10*3/uL (ref 0.0–0.1)
Basophils Relative: 0 %
Eosinophils Absolute: 0 10*3/uL (ref 0.0–0.5)
Eosinophils Relative: 0 %
HCT: 38.9 % (ref 36.0–46.0)
Hemoglobin: 12.4 g/dL (ref 12.0–15.0)
Immature Granulocytes: 4 %
Lymphocytes Relative: 23 %
Lymphs Abs: 1.8 10*3/uL (ref 0.7–4.0)
MCH: 28.6 pg (ref 26.0–34.0)
MCHC: 31.9 g/dL (ref 30.0–36.0)
MCV: 89.6 fL (ref 80.0–100.0)
Monocytes Absolute: 2.6 10*3/uL — ABNORMAL HIGH (ref 0.1–1.0)
Monocytes Relative: 35 %
Neutro Abs: 2.9 10*3/uL (ref 1.7–7.7)
Neutrophils Relative %: 38 %
Platelets: 162 10*3/uL (ref 150–400)
RBC: 4.34 MIL/uL (ref 3.87–5.11)
RDW: 13.4 % (ref 11.5–15.5)
WBC: 7.6 10*3/uL (ref 4.0–10.5)
nRBC: 0 % (ref 0.0–0.2)

## 2022-04-20 NOTE — Progress Notes (Unsigned)
[place]  Telephone:([phone] Fax:(336) [phone]  ID: Ricardo Jericho OB: 05/26/1945  MR#: [id number]  [id number]  Patient Care Team: Eulas Post, MD as PCP - General (Family Medicine) Vladimir Crofts, MD as Consulting Physician (Neurology)  CHIEF COMPLAINT: Monocytosis.  INTERVAL HISTORY: Patient is a 77 year old female who was noted to have a normal white blood cell count, but slowly increasing monocyte level.  Patient has significant dementia, therefore the entire history is given by her daughter.  She currently feels well and is at her baseline.  She has no new neurologic complaints.  There is no report of any recent fevers or illnesses.  She has a good appetite and denies weight loss.  She has no chest pain, shortness of breath, cough, or hemoptysis.  She denies any nausea, vomiting, constipation, or diarrhea.  She has no urinary complaints.  Patient offers no specific complaints today.    REVIEW OF SYSTEMS:   Review of Systems  Constitutional: Negative.  Negative for fever, malaise/fatigue and weight loss.  Respiratory: Negative.  Negative for cough, hemoptysis and shortness of breath.   Cardiovascular: Negative.  Negative for chest pain and leg swelling.  Gastrointestinal: Negative.  Negative for abdominal pain.  Genitourinary: Negative.  Negative for dysuria.  Musculoskeletal: Negative.  Negative for back pain.  Skin: Negative.  Negative for rash.  Neurological: Negative.  Negative for dizziness, focal weakness, weakness and headaches.  Psychiatric/Behavioral:  Positive for memory loss.     As per HPI. Otherwise, a complete review of systems is negative.  PAST MEDICAL HISTORY: Past Medical History:  Diagnosis Date   Dementia ([place])    Depression    GERD (gastroesophageal reflux disease)    Hyperlipidemia    Hypertension    Hypothyroidism     PAST SURGICAL HISTORY: Past Surgical History:  Procedure Laterality Date   ABDOMINAL  HYSTERECTOMY     APPENDECTOMY     during cholecystectomy   BACK SURGERY     CHOLECYSTECTOMY     removed appendix at this time   COLONOSCOPY WITH PROPOFOL N/A 06/30/2015   Procedure: COLONOSCOPY WITH PROPOFOL;  Surgeon: Manya Silvas, MD;  Location: [place];  Service: Endoscopy;  Laterality: N/A;tubular adenoma repeat 06/2020   FINGER ARTHROPLASTY Right 06/25/2014   Procedure: FINGER ARTHROPLASTY;  Surgeon: Christophe Louis, MD;  Location: [facility];  Service: Orthopedics;  Laterality: Right;   SALPINGOOPHORECTOMY      FAMILY HISTORY: Family History  Problem Relation Age of Onset   Hypertension Father    Heart disease Father    Diabetes Father    Congestive Heart Failure Father    Stroke Paternal Uncle    Hypertension Brother    CAD Brother    Anemia Brother    Breast cancer Maternal Aunt     ADVANCED DIRECTIVES (Y/N):  N  HEALTH MAINTENANCE: Social History   Tobacco Use   Smoking status: Never   Smokeless tobacco: Never  Substance Use Topics   Alcohol use: No   Drug use: No     Colonoscopy:  PAP:  Bone density:  Lipid panel:  No Known Allergies  Current Outpatient Medications  Medication Sig Dispense Refill   aspirin EC 325 MG tablet Take 1 tablet (325 mg total) by mouth daily. 90 tablet 3   cholecalciferol (VITAMIN D3) 25 MCG (1000 UNIT) tablet Take 2,000 Units by mouth daily.     ibuprofen (ADVIL) 200 MG tablet Take 200 mg by mouth every 6 (six) hours as  needed.     levothyroxine (SYNTHROID) 50 MCG tablet Take 1 tablet (50 mcg total) by mouth daily before breakfast. (Patient taking differently: Take 88 mcg by mouth daily before breakfast.) 90 tablet 3   levothyroxine (SYNTHROID) 88 MCG tablet Take 88 mcg by mouth every morning.     metoprolol tartrate (LOPRESSOR) 50 MG tablet TAKE 1 TABLET BY MOUTH EVERY DAY 90 tablet 4   Multiple Vitamins-Minerals (MULTIVITAMIN WITH MINERALS) tablet Take 1 tablet by mouth daily.     OLANZapine (ZYPREXA) 5 MG tablet Take  1 tablet (5 mg total) by mouth at bedtime. (Patient taking differently: Take 10 mg by mouth at bedtime.) 30 tablet 0   polyethylene glycol (MIRALAX) 17 g packet Take 17 g by mouth daily.     senna (SENOKOT) 8.6 MG TABS tablet Take 1 tablet by mouth.     sertraline (ZOLOFT) 50 MG tablet TAKE 1 TABLET BY MOUTH EVERY DAY (Patient taking differently: Take 75 mg by mouth daily.) 90 tablet 0   No current facility-administered medications for this visit.    OBJECTIVE: Vitals:   04/20/22 1510  BP: (!) 149/81  Pulse: 68  Temp: 97.6 F (36.4 C)  SpO2: 98%     Body mass index is 32.03 kg/m.    ECOG FS:0 - Asymptomatic  General: Well-developed, well-nourished, no acute distress. Eyes: Pink conjunctiva, anicteric sclera. HEENT: Normocephalic, moist mucous membranes. Lungs: No audible wheezing or coughing. Heart: Regular rate and rhythm. Abdomen: Soft, nontender, no obvious distention. Musculoskeletal: No edema, cyanosis, or clubbing. Neuro: Alert, answering all questions appropriately. Cranial nerves grossly intact. Skin: No rashes or petechiae noted. Psych: Normal affect. Lymphatics: No cervical, calvicular, axillary or inguinal LAD.   LAB RESULTS:  Lab Results  Component Value Date   NA 141 10/07/2021   K 4.4 10/07/2021   CL 103 10/07/2021   CO2 25 10/07/2021   GLUCOSE 96 10/07/2021   BUN 17 10/07/2021   CREATININE 0.98 10/07/2021   CALCIUM 9.4 10/07/2021   PROT 7.0 10/07/2021   ALBUMIN 4.4 10/07/2021   AST 20 10/07/2021   ALT 17 10/07/2021   ALKPHOS 131 (H) 10/07/2021   BILITOT 0.3 10/07/2021   GFRNONAA >60 12/24/2019   GFRAA >60 11/23/2019    Lab Results  Component Value Date   WBC 7.6 04/20/2022   NEUTROABS 2.9 04/20/2022   HGB 12.4 04/20/2022   HCT 38.9 04/20/2022   MCV 89.6 04/20/2022   PLT 162 04/20/2022     STUDIES: No results found.  ASSESSMENT: Monocytosis.  PLAN:    Monocytosis: Upon review of patient's chart, patient has had a slowly increasing  monocyte level since October 2021 when it was reported at 1.6.  Today's result is 2.6.  She continues to have a normal total white blood cell counts.  CMML is a possibility therefore peripheral blood flow cytometry as well as IntelliGEN Myeloid Panel and BCR-ABL mutation were ordered and are pending at the dictation.  No intervention is needed.  Patient does not require bone marrow biopsy or treatment.  She will have a video assisted telemedicine visit along with her daughter in approximately 3 weeks to discuss the results.  I spent a total of 45 minutes reviewing chart data, face-to-face evaluation with the patient, counseling and coordination of care as detailed above.   Patient expressed understanding and was in agreement with this plan. She also understands that She can call clinic at any time with any questions, concerns, or complaints.    Cancer Staging  No matching staging information was found for the patient.  Lloyd Huger, MD   04/21/2022 11:05 AM

## 2022-04-22 LAB — COMP PANEL: LEUKEMIA/LYMPHOMA

## 2022-04-29 LAB — BCR-ABL1, CML/ALL, PCR, QUANT: Interpretation (BCRAL):: NEGATIVE

## 2022-05-07 LAB — INTELLIGEN MYELOID

## 2022-05-12 ENCOUNTER — Inpatient Hospital Stay: Payer: Medicare Other | Attending: Oncology | Admitting: Oncology

## 2022-05-12 DIAGNOSIS — C9310 Chronic myelomonocytic leukemia not having achieved remission: Secondary | ICD-10-CM | POA: Diagnosis not present

## 2022-05-12 NOTE — Progress Notes (Signed)
[place]  Telephone:(336) [phone] Fax:(336) [phone]  ID: Paige Baldwin OB: 10-13-1945  MR#: [id number]  [id number]  Patient Care Team: Eulas Post, MD as PCP - General (Family Medicine) Vladimir Crofts, MD as Consulting Physician (Neurology)  I connected with Paige Baldwin on 05/12/22 at 11:00 AM EDT by video enabled telemedicine visit and verified that I am speaking with the correct person using two identifiers.   I discussed the limitations, risks, security and privacy concerns of performing an evaluation and management service by telemedicine and the availability of in-person appointments. I also discussed with the patient that there may be a patient responsible charge related to this service. The patient expressed understanding and agreed to proceed.   Other persons participating in the visit and their role in the encounter: Patient, patient's son, MD.  Patient's location: Home. Provider's location: Clinic.  CHIEF COMPLAINT: Likely CMML.  INTERVAL HISTORY: Patient and son agreed to do a video assisted telemedicine visit for further evaluation and discussion of her laboratory results.  Patient has significant dementia and the entire history is given by her son.  She currently feels well and is at her baseline.  She has no new neurologic complaints.  There is no report of any recent fevers or illnesses.  She has a good appetite and denies weight loss.  She has no chest pain, shortness of breath, cough, or hemoptysis.  She denies any nausea, vomiting, constipation, or diarrhea.  She has no urinary complaints.  Patient appears to be at her baseline.  REVIEW OF SYSTEMS:   Review of Systems  Unable to perform ROS: Dementia    PAST MEDICAL HISTORY: Past Medical History:  Diagnosis Date   Dementia ([place])    Depression    GERD (gastroesophageal reflux disease)    Hyperlipidemia    Hypertension    Hypothyroidism     PAST SURGICAL HISTORY: Past  Surgical History:  Procedure Laterality Date   ABDOMINAL HYSTERECTOMY     APPENDECTOMY     during cholecystectomy   BACK SURGERY     CHOLECYSTECTOMY     removed appendix at this time   COLONOSCOPY WITH PROPOFOL N/A 06/30/2015   Procedure: COLONOSCOPY WITH PROPOFOL;  Surgeon: Manya Silvas, MD;  Location: [place];  Service: Endoscopy;  Laterality: N/A;tubular adenoma repeat 06/2020   FINGER ARTHROPLASTY Right 06/25/2014   Procedure: FINGER ARTHROPLASTY;  Surgeon: Christophe Louis, MD;  Location: [facility];  Service: Orthopedics;  Laterality: Right;   SALPINGOOPHORECTOMY      FAMILY HISTORY: Family History  Problem Relation Age of Onset   Hypertension Father    Heart disease Father    Diabetes Father    Congestive Heart Failure Father    Stroke Paternal Uncle    Hypertension Brother    CAD Brother    Anemia Brother    Breast cancer Maternal Aunt     ADVANCED DIRECTIVES (Y/N):  N  HEALTH MAINTENANCE: Social History   Tobacco Use   Smoking status: Never   Smokeless tobacco: Never  Substance Use Topics   Alcohol use: No   Drug use: No     Colonoscopy:  PAP:  Bone density:  Lipid panel:  No Known Allergies  Current Outpatient Medications  Medication Sig Dispense Refill   aspirin EC 325 MG tablet Take 1 tablet (325 mg total) by mouth daily. 90 tablet 3   cholecalciferol (VITAMIN D3) 25 MCG (1000 UNIT) tablet Take 2,000 Units by mouth daily.  ibuprofen (ADVIL) 200 MG tablet Take 200 mg by mouth every 6 (six) hours as needed.     levothyroxine (SYNTHROID) 50 MCG tablet Take 1 tablet (50 mcg total) by mouth daily before breakfast. (Patient taking differently: Take 88 mcg by mouth daily before breakfast.) 90 tablet 3   levothyroxine (SYNTHROID) 88 MCG tablet Take 88 mcg by mouth every morning.     metoprolol tartrate (LOPRESSOR) 50 MG tablet TAKE 1 TABLET BY MOUTH EVERY DAY 90 tablet 4   Multiple Vitamins-Minerals (MULTIVITAMIN WITH MINERALS) tablet Take 1 tablet  by mouth daily.     OLANZapine (ZYPREXA) 5 MG tablet Take 1 tablet (5 mg total) by mouth at bedtime. (Patient taking differently: Take 10 mg by mouth at bedtime.) 30 tablet 0   polyethylene glycol (MIRALAX) 17 g packet Take 17 g by mouth daily.     senna (SENOKOT) 8.6 MG TABS tablet Take 1 tablet by mouth.     sertraline (ZOLOFT) 50 MG tablet TAKE 1 TABLET BY MOUTH EVERY DAY (Patient taking differently: Take 75 mg by mouth daily.) 90 tablet 0   No current facility-administered medications for this visit.    OBJECTIVE: There were no vitals filed for this visit.    There is no height or weight on file to calculate BMI.    ECOG FS:0 - Asymptomatic  General: Well-developed, well-nourished, no acute distress. HEENT: Normocephalic. Neuro: Alert, answering all questions appropriately. Cranial nerves grossly intact. Psych: Normal affect.  LAB RESULTS:  Lab Results  Component Value Date   NA 141 10/07/2021   K 4.4 10/07/2021   CL 103 10/07/2021   CO2 25 10/07/2021   GLUCOSE 96 10/07/2021   BUN 17 10/07/2021   CREATININE 0.98 10/07/2021   CALCIUM 9.4 10/07/2021   PROT 7.0 10/07/2021   ALBUMIN 4.4 10/07/2021   AST 20 10/07/2021   ALT 17 10/07/2021   ALKPHOS 131 (H) 10/07/2021   BILITOT 0.3 10/07/2021   GFRNONAA >60 12/24/2019   GFRAA >60 11/23/2019    Lab Results  Component Value Date   WBC 7.6 04/20/2022   NEUTROABS 2.9 04/20/2022   HGB 12.4 04/20/2022   HCT 38.9 04/20/2022   MCV 89.6 04/20/2022   PLT 162 04/20/2022     STUDIES: No results found.  ASSESSMENT: Likely CMML.  PLAN:    Likely CMML: Upon review of patient's chart, patient has had a slowly increasing monocyte level since October 2021 when it was reported at 1.6.  Her most recent result was 2.6.  Peripheral blood flow cytometry revealed an absolute monocytosis of 31%.  PCR-ABL mutation is negative.  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  Diagnosis  would require a bone marrow biopsy to confirm.  This is not necessary.  Patient does not require treatment.  No intervention is needed.  After lengthy discussion with the son, it was agreed upon that no further follow-up is necessary.  Please monitor blood counts 1-2 times per year and if patient develops other cytopenias can consider referring her back for further evaluation.    I provided 20 minutes of face-to-face video visit time during this encounter which included chart review, counseling, and coordination of care as documented above.   Patient expressed understanding and was in agreement with this plan. She also understands that She can call clinic at any time with any questions, concerns, or complaints.    Lloyd Huger, MD   05/12/2022 11:10 AM

## 2022-05-23 ENCOUNTER — Emergency Department: Payer: Medicare Other

## 2022-05-23 ENCOUNTER — Other Ambulatory Visit: Payer: Self-pay

## 2022-05-23 ENCOUNTER — Emergency Department
Admission: EM | Admit: 2022-05-23 | Discharge: 2022-05-23 | Disposition: A | Discharge status: Home / Self Care | Payer: Medicare Other | Attending: Emergency Medicine | Admitting: Emergency Medicine

## 2022-05-23 DIAGNOSIS — I10 Essential (primary) hypertension: Secondary | ICD-10-CM | POA: Insufficient documentation

## 2022-05-23 DIAGNOSIS — E039 Hypothyroidism, unspecified: Secondary | ICD-10-CM | POA: Diagnosis not present

## 2022-05-23 DIAGNOSIS — W19XXXA Unspecified fall, initial encounter: Secondary | ICD-10-CM | POA: Diagnosis not present

## 2022-05-23 DIAGNOSIS — S0101XA Laceration without foreign body of scalp, initial encounter: Secondary | ICD-10-CM | POA: Diagnosis not present

## 2022-05-23 DIAGNOSIS — F0390 Unspecified dementia without behavioral disturbance: Secondary | ICD-10-CM | POA: Diagnosis not present

## 2022-05-23 DIAGNOSIS — S0990XA Unspecified injury of head, initial encounter: Secondary | ICD-10-CM

## 2022-05-23 MED ORDER — BACITRACIN ZINC 500 UNIT/GM EX OINT
TOPICAL_OINTMENT | Freq: Once | CUTANEOUS | Status: AC
  Filled 2022-05-23: qty 0.9

## 2022-05-23 MED ORDER — LIDOCAINE-EPINEPHRINE (PF) 2 %-1:200000 IJ SOLN
20.0000 mL | Freq: Once | INTRAMUSCULAR | Status: AC
  Administered 2022-05-23: 20 mL via INTRADERMAL
  Filled 2022-05-23: qty 20

## 2022-05-23 NOTE — ED Triage Notes (Addendum)
Unwitnessed fall at [facility] resulting in laceration to L posterior scalp from dresser.  Unknown LOC. Ambulated at scene.  Not on thinners. Altered at baseline.  Endorses L hip pain, L rib pain (especially while placing EKG leads), headache, abd pain in triage.

## 2022-05-23 NOTE — ED Provider Notes (Signed)
[facility] Provider Note    Event Date/Time   First MD Initiated Contact with Patient 05/23/22 [phone]     (approximate)   History   No chief complaint on file.   HPI  Paige Baldwin is a 77 y.o. female with a history of dementia, hypertension, hyperlipidemia, hypothyroidism, GERD, depression who presents after an unwitnessed fall at her nursing facility.  The patient is unable to give any history and denies any acute complaints.  She has a laceration to the back of her head.  The son states that she was found sitting on the floor by her bed.  She has had falls previously.  When I examine her the patient reports pain when I move her right hip.  I had the past medical records.  The patient was most recently seen by Dr. Grayland Ormond from oncology for likely CMML on 3/20.  It was determined that she does not require treatment at this time.  She has no recent ED visits or hospitalizations.   Physical Exam   Triage Vital Signs: ED Triage Vitals  Enc Vitals Group     BP 05/23/22 0652 (!) 185/69     Pulse Rate 05/23/22 0652 61     Resp 05/23/22 0652 18     Temp 05/23/22 0652 97.6 F (36.4 C)     Temp Source 05/23/22 0652 Oral     SpO2 05/23/22 0652 98 %     Weight 05/23/22 0650 165 lb 5.5 oz (75 kg)     Height --      Head Circumference --      Peak Flow --      Pain Score --      Pain Loc --      Pain Edu? --      Excl. in [place]? --     Most recent vital signs: Vitals:   05/23/22 0823 05/23/22 0830  BP: 125/76 (!) 149/63  Pulse: (!) 57 (!) 54  Resp: 12 12  Temp:    SpO2: 100% 99%     General: Alert, confused, no distress.  CV:  Good peripheral perfusion.  Resp:  Normal effort.  Abd:  No distention.  Other:  EOMI.  PERRLA.  No facial droop.  Motor intact in all extremities.  Pain on range of motion of the right hip although the patient is able to move it without apparent difficulty.  No chest wall tenderness.  No abdominal tenderness.  No midline  spinal tenderness.  Left posterior parietal scalp laceration approximately 5 cm in length.   ED Results / Procedures / Treatments   Labs (all labs ordered are listed, but only abnormal results are displayed) Labs Reviewed - No data to display   EKG  ED ECG REPORT I, Arta Silence, the attending physician, personally viewed and interpreted this ECG.  Date: 05/23/2022 EKG Time: 0650 Rate: 58 Rhythm: normal sinus rhythm QRS Axis: normal Intervals: normal ST/T Wave abnormalities: normal Narrative Interpretation: no evidence of acute ischemia    RADIOLOGY  CT head: I independently viewed and interpreted the images; there is no ICH.  CT cervical spine: No acute fracture No XR R hip: No acute fracture   PROCEDURES:  Critical Care performed: No  ..Laceration Repair  Date/Time: 05/23/2022 9:14 AM  Performed by: Arta Silence, MD Authorized by: Arta Silence, MD   Consent:    Consent obtained:  Verbal   Consent given by:  Healthcare agent Universal protocol:    Patient  identity confirmed:  Verbally with patient Anesthesia:    Anesthesia method:  Local infiltration   Local anesthetic:  Lidocaine 2% WITH epi Laceration details:    Location:  Scalp   Scalp location:  L parietal   Length (cm):  5   Depth (mm):  3 Exploration:    Contaminated: no   Treatment:    Area cleansed with:  Povidone-iodine   Amount of cleaning:  Standard   Irrigation solution:  Tap water   Irrigation method:  Syringe Skin repair:    Repair method:  Staples   Number of staples:  11 Approximation:    Approximation:  Close Repair type:    Repair type:  Simple Post-procedure details:    Dressing:  Antibiotic ointment   Procedure completion:  Tolerated well, no immediate complications    MEDICATIONS ORDERED IN ED: Medications  bacitracin ointment (has no administration in time range)  lidocaine-EPINEPHrine (XYLOCAINE W/EPI) 2 %-1:200000 (PF) injection 20 mL (20 mLs  Intradermal Given by Other 05/23/22 [id number])     IMPRESSION / MDM / [place] / ED COURSE  I reviewed the triage vital signs and the nursing notes.  77 year old female with PMH as noted above presents after an unwitnessed fall.  She has a laceration to her scalp.  On exam she also demonstrates pain on range of motion of the right hip.  The triage evaluation noted rib and abdominal pain although she had no pain or tenderness in these areas during my exam.  Differential diagnosis includes, but is not limited to, minor head injury, concussion, ICH.  Overall presentation is consistent with mechanical fall.  We will obtain CT head and cervical spine, x-rays of the right hip, repair the laceration, and reassess.  There is no indication for lab workup at this time.  Patient's presentation is most consistent with acute presentation with potential threat to life or bodily function.  The patient is on the cardiac monitor to evaluate for evidence of arrhythmia and/or significant heart rate changes.  ----------------------------------------- 9:16 AM on 05/23/2022 -----------------------------------------  CTs show parietal scalp hematoma but no intracranial findings.  There is no cervical spine fracture.  X-rays are negative.  Laceration was repaired without difficulty.  The patient is stable for discharge back to her facility.  I counseled her son on the results of the imaging, follow-up for staple removal, and return precautions; he expressed understanding.   FINAL CLINICAL IMPRESSION(S) / ED DIAGNOSES   Final diagnoses:  Injury of head, initial encounter  Laceration of scalp, initial encounter     Rx / DC Orders   ED Discharge Orders     None        Note:  This document was prepared using Dragon voice recognition software and may include unintentional dictation errors.    Arta Silence, MD 05/23/22 [phone]

## 2022-05-23 NOTE — ED Notes (Signed)
Report given to [facility] NP on call Flint Melter. Attempted to call SNF to give report to receiving nurse and unable to get answer. Son acknowledges all discharge information

## 2022-05-23 NOTE — Discharge Instructions (Addendum)
The staples should be removed in 7 to 10 days.  Bacitracin or Neosporin appointment may be applied twice daily to the wound.  Paige Baldwin should return to the ER for new or worsening headache, bleeding or drainage from the wound, confusion, change in mental status, inability to walk or bear weight, or any other new or worsening symptoms that are concerning.

## 2023-03-26 DEATH — deceased
